# Patient Record
Sex: Female | Born: 1935
Health system: Southern US, Community
[De-identification: ages and names within clinical notes are randomized; demographics above are authoritative.]

## PROBLEM LIST (undated history)

## (undated) DIAGNOSIS — I10 Essential (primary) hypertension: Secondary | ICD-10-CM

## (undated) DIAGNOSIS — I2510 Atherosclerotic heart disease of native coronary artery without angina pectoris: Secondary | ICD-10-CM

## (undated) DIAGNOSIS — I509 Heart failure, unspecified: Secondary | ICD-10-CM

## (undated) DIAGNOSIS — E785 Hyperlipidemia, unspecified: Secondary | ICD-10-CM

## (undated) DIAGNOSIS — Z853 Personal history of malignant neoplasm of breast: Secondary | ICD-10-CM

## (undated) HISTORY — DX: Personal history of malignant neoplasm of breast: Z85.3

## (undated) HISTORY — PX: CARPAL TUNNEL RELEASE: SHX101

## (undated) HISTORY — DX: Essential (primary) hypertension: I10

## (undated) HISTORY — PX: CARDIAC CATHETERIZATION: SHX172

---

## 1998-05-01 ENCOUNTER — Other Ambulatory Visit: Admission: RE | Admit: 1998-05-01 | Discharge: 1998-05-01 | Payer: Self-pay | Admitting: Internal Medicine

## 1999-05-02 ENCOUNTER — Other Ambulatory Visit: Admission: RE | Admit: 1999-05-02 | Discharge: 1999-05-02 | Payer: Self-pay | Admitting: Internal Medicine

## 2000-05-18 ENCOUNTER — Other Ambulatory Visit: Admission: RE | Admit: 2000-05-18 | Discharge: 2000-05-18 | Payer: Self-pay | Admitting: Internal Medicine

## 2000-08-04 ENCOUNTER — Ambulatory Visit (HOSPITAL_COMMUNITY): Admission: RE | Admit: 2000-08-04 | Discharge: 2000-08-04 | Payer: Self-pay | Admitting: Orthopedic Surgery

## 2006-04-08 ENCOUNTER — Encounter (INDEPENDENT_AMBULATORY_CARE_PROVIDER_SITE_OTHER): Payer: Self-pay | Admitting: Specialist

## 2006-04-08 ENCOUNTER — Ambulatory Visit (HOSPITAL_BASED_OUTPATIENT_CLINIC_OR_DEPARTMENT_OTHER): Admission: RE | Admit: 2006-04-08 | Discharge: 2006-04-08 | Payer: Self-pay | Admitting: Orthopedic Surgery

## 2007-08-17 ENCOUNTER — Encounter: Admission: RE | Admit: 2007-08-17 | Discharge: 2007-08-17 | Payer: Self-pay | Admitting: Internal Medicine

## 2007-08-17 ENCOUNTER — Encounter (INDEPENDENT_AMBULATORY_CARE_PROVIDER_SITE_OTHER): Payer: Self-pay | Admitting: Diagnostic Radiology

## 2007-08-23 ENCOUNTER — Ambulatory Visit: Admission: RE | Admit: 2007-08-23 | Discharge: 2007-09-21 | Payer: Self-pay | Admitting: Radiation Oncology

## 2007-08-25 ENCOUNTER — Encounter: Admission: RE | Admit: 2007-08-25 | Discharge: 2007-08-25 | Payer: Self-pay | Admitting: Internal Medicine

## 2007-09-02 ENCOUNTER — Encounter (INDEPENDENT_AMBULATORY_CARE_PROVIDER_SITE_OTHER): Payer: Self-pay | Admitting: Surgery

## 2007-09-02 ENCOUNTER — Encounter: Admission: RE | Admit: 2007-09-02 | Discharge: 2007-09-02 | Payer: Self-pay | Admitting: Surgery

## 2007-09-02 ENCOUNTER — Ambulatory Visit (HOSPITAL_BASED_OUTPATIENT_CLINIC_OR_DEPARTMENT_OTHER): Admission: RE | Admit: 2007-09-02 | Discharge: 2007-09-03 | Payer: Self-pay | Admitting: Surgery

## 2007-09-15 ENCOUNTER — Ambulatory Visit: Payer: Self-pay | Admitting: Oncology

## 2007-09-15 LAB — CBC WITH DIFFERENTIAL/PLATELET
BASO%: 0.1 % (ref 0.0–2.0)
Basophils Absolute: 0 10*3/uL (ref 0.0–0.1)
EOS%: 1 % (ref 0.0–7.0)
HCT: 38.9 % (ref 34.8–46.6)
HGB: 13.4 g/dL (ref 11.6–15.9)
LYMPH%: 16.9 % (ref 14.0–48.0)
MCH: 30.8 pg (ref 26.0–34.0)
MCHC: 34.4 g/dL (ref 32.0–36.0)
MCV: 89.5 fL (ref 81.0–101.0)
NEUT%: 72 % (ref 39.6–76.8)
Platelets: 198 10*3/uL (ref 145–400)

## 2007-09-15 LAB — LACTATE DEHYDROGENASE: LDH: 181 U/L (ref 94–250)

## 2007-09-15 LAB — COMPREHENSIVE METABOLIC PANEL
ALT: 45 U/L — ABNORMAL HIGH (ref 0–35)
AST: 54 U/L — ABNORMAL HIGH (ref 0–37)
BUN: 19 mg/dL (ref 6–23)
Calcium: 9.2 mg/dL (ref 8.4–10.5)
Creatinine, Ser: 0.87 mg/dL (ref 0.40–1.20)
Total Bilirubin: 0.8 mg/dL (ref 0.3–1.2)

## 2007-09-19 LAB — VITAMIN D PNL(25-HYDRXY+1,25-DIHY)-BLD
Vit D, 1,25-Dihydroxy: 50 pg/mL (ref 6–62)
Vit D, 25-Hydroxy: 49 ng/mL (ref 30–89)

## 2007-09-21 ENCOUNTER — Encounter: Payer: Self-pay | Admitting: Oncology

## 2007-09-21 ENCOUNTER — Ambulatory Visit: Admission: RE | Admit: 2007-09-21 | Discharge: 2007-09-21 | Payer: Self-pay | Admitting: Oncology

## 2007-09-24 ENCOUNTER — Ambulatory Visit (HOSPITAL_COMMUNITY): Admission: RE | Admit: 2007-09-24 | Discharge: 2007-09-24 | Payer: Self-pay | Admitting: Oncology

## 2007-09-30 HISTORY — PX: BREAST SURGERY: SHX581

## 2007-10-11 ENCOUNTER — Ambulatory Visit (HOSPITAL_BASED_OUTPATIENT_CLINIC_OR_DEPARTMENT_OTHER): Admission: RE | Admit: 2007-10-11 | Discharge: 2007-10-11 | Payer: Self-pay | Admitting: Surgery

## 2007-10-12 LAB — CBC WITH DIFFERENTIAL/PLATELET
BASO%: 1.3 % (ref 0.0–2.0)
Basophils Absolute: 0.1 10*3/uL (ref 0.0–0.1)
EOS%: 1.4 % (ref 0.0–7.0)
HCT: 39.7 % (ref 34.8–46.6)
HGB: 14 g/dL (ref 11.6–15.9)
LYMPH%: 17.4 % (ref 14.0–48.0)
MCH: 31.1 pg (ref 26.0–34.0)
MCHC: 35.2 g/dL (ref 32.0–36.0)
MCV: 88.4 fL (ref 81.0–101.0)
MONO%: 11.1 % (ref 0.0–13.0)
NEUT%: 68.8 % (ref 39.6–76.8)
Platelets: 177 10*3/uL (ref 145–400)
lymph#: 1.1 10*3/uL (ref 0.9–3.3)

## 2007-10-19 LAB — CBC WITH DIFFERENTIAL/PLATELET
BASO%: 2 % (ref 0.0–2.0)
Basophils Absolute: 0 10*3/uL (ref 0.0–0.1)
EOS%: 6.3 % (ref 0.0–7.0)
HGB: 12.7 g/dL (ref 11.6–15.9)
MCH: 30.5 pg (ref 26.0–34.0)
MCHC: 35.2 g/dL (ref 32.0–36.0)
MCV: 86.6 fL (ref 81.0–101.0)
MONO%: 12 % (ref 0.0–13.0)
RDW: 9.9 % — ABNORMAL LOW (ref 11.3–14.5)
lymph#: 0.5 10*3/uL — ABNORMAL LOW (ref 0.9–3.3)

## 2007-10-25 ENCOUNTER — Ambulatory Visit: Payer: Self-pay | Admitting: Oncology

## 2007-10-26 LAB — CBC WITH DIFFERENTIAL/PLATELET
EOS%: 0.4 % (ref 0.0–7.0)
Eosinophils Absolute: 0 10*3/uL (ref 0.0–0.5)
HGB: 12.9 g/dL (ref 11.6–15.9)
MCH: 30.2 pg (ref 26.0–34.0)
MCV: 86.3 fL (ref 81.0–101.0)
MONO%: 8.8 % (ref 0.0–13.0)
NEUT#: 4.3 10*3/uL (ref 1.5–6.5)
RBC: 4.26 10*6/uL (ref 3.70–5.32)
RDW: 10.4 % — ABNORMAL LOW (ref 11.3–14.5)
lymph#: 0.9 10*3/uL (ref 0.9–3.3)

## 2007-11-02 LAB — CBC WITH DIFFERENTIAL/PLATELET
Basophils Absolute: 0 10*3/uL (ref 0.0–0.1)
Eosinophils Absolute: 0 10*3/uL (ref 0.0–0.5)
HGB: 11.8 g/dL (ref 11.6–15.9)
MCV: 86.1 fL (ref 81.0–101.0)
MONO%: 9.6 % (ref 0.0–13.0)
NEUT#: 0.6 10*3/uL — ABNORMAL LOW (ref 1.5–6.5)
RBC: 3.9 10*6/uL (ref 3.70–5.32)
RDW: 10.6 % — ABNORMAL LOW (ref 11.3–14.5)
WBC: 1.1 10*3/uL — ABNORMAL LOW (ref 3.9–10.0)
lymph#: 0.4 10*3/uL — ABNORMAL LOW (ref 0.9–3.3)

## 2007-11-09 LAB — CBC WITH DIFFERENTIAL/PLATELET
Basophils Absolute: 0.1 10*3/uL (ref 0.0–0.1)
Eosinophils Absolute: 0 10*3/uL (ref 0.0–0.5)
HGB: 12.6 g/dL (ref 11.6–15.9)
LYMPH%: 11.3 % — ABNORMAL LOW (ref 14.0–48.0)
MONO#: 0.8 10*3/uL (ref 0.1–0.9)
NEUT#: 6.9 10*3/uL — ABNORMAL HIGH (ref 1.5–6.5)
Platelets: 161 10*3/uL (ref 145–400)
RBC: 4.11 10*6/uL (ref 3.70–5.32)
RDW: 11.6 % (ref 11.3–14.5)
WBC: 8.8 10*3/uL (ref 3.9–10.0)

## 2007-11-09 LAB — COMPREHENSIVE METABOLIC PANEL
Albumin: 4.2 g/dL (ref 3.5–5.2)
BUN: 12 mg/dL (ref 6–23)
CO2: 23 mEq/L (ref 19–32)
Glucose, Bld: 75 mg/dL (ref 70–99)
Potassium: 3.9 mEq/L (ref 3.5–5.3)
Sodium: 139 mEq/L (ref 135–145)
Total Protein: 6.8 g/dL (ref 6.0–8.3)

## 2007-11-16 LAB — CBC WITH DIFFERENTIAL/PLATELET
Eosinophils Absolute: 0 10*3/uL (ref 0.0–0.5)
MONO#: 0.1 10*3/uL (ref 0.1–0.9)
NEUT#: 1.4 10*3/uL — ABNORMAL LOW (ref 1.5–6.5)
RBC: 3.66 10*6/uL — ABNORMAL LOW (ref 3.70–5.32)
RDW: 13.6 % (ref 11.3–14.5)
WBC: 1.8 10*3/uL — ABNORMAL LOW (ref 3.9–10.0)
lymph#: 0.2 10*3/uL — ABNORMAL LOW (ref 0.9–3.3)

## 2007-11-16 LAB — URINALYSIS, MICROSCOPIC - CHCC
Bilirubin (Urine): NEGATIVE
Blood: NEGATIVE
Glucose: NEGATIVE g/dL
Ketones: NEGATIVE mg/dL
Leukocyte Esterase: NEGATIVE
Specific Gravity, Urine: 1.03 (ref 1.003–1.035)

## 2007-11-23 LAB — URINALYSIS, MICROSCOPIC - CHCC
Ketones: NEGATIVE mg/dL
Nitrite: NEGATIVE
Protein: NEGATIVE mg/dL

## 2007-11-23 LAB — CBC WITH DIFFERENTIAL/PLATELET
BASO%: 1.4 % (ref 0.0–2.0)
EOS%: 0.3 % (ref 0.0–7.0)
HCT: 35.3 % (ref 34.8–46.6)
MCH: 31.2 pg (ref 26.0–34.0)
MCHC: 33.8 g/dL (ref 32.0–36.0)
MCV: 92.1 fL (ref 81.0–101.0)
MONO%: 10.6 % (ref 0.0–13.0)
NEUT%: 77 % — ABNORMAL HIGH (ref 39.6–76.8)
lymph#: 0.7 10*3/uL — ABNORMAL LOW (ref 0.9–3.3)

## 2007-11-25 LAB — URINE CULTURE

## 2007-11-29 LAB — CBC WITH DIFFERENTIAL/PLATELET
Eosinophils Absolute: 0 10*3/uL (ref 0.0–0.5)
MONO#: 0.1 10*3/uL (ref 0.1–0.9)
NEUT#: 3 10*3/uL (ref 1.5–6.5)
Platelets: 174 10*3/uL (ref 145–400)
RBC: 3.41 10*6/uL — ABNORMAL LOW (ref 3.70–5.32)
RDW: 15 % — ABNORMAL HIGH (ref 11.3–14.5)
WBC: 3.6 10*3/uL — ABNORMAL LOW (ref 3.9–10.0)
lymph#: 0.5 10*3/uL — ABNORMAL LOW (ref 0.9–3.3)

## 2007-12-03 ENCOUNTER — Ambulatory Visit: Payer: Self-pay | Admitting: Oncology

## 2007-12-07 LAB — CBC WITH DIFFERENTIAL/PLATELET
Eosinophils Absolute: 0 10*3/uL (ref 0.0–0.5)
HCT: 32.7 % — ABNORMAL LOW (ref 34.8–46.6)
HGB: 11.2 g/dL — ABNORMAL LOW (ref 11.6–15.9)
LYMPH%: 11.3 % — ABNORMAL LOW (ref 14.0–48.0)
MONO#: 0.9 10*3/uL (ref 0.1–0.9)
NEUT#: 5.5 10*3/uL (ref 1.5–6.5)
NEUT%: 75.4 % (ref 39.6–76.8)
Platelets: 152 10*3/uL (ref 145–400)
WBC: 7.3 10*3/uL (ref 3.9–10.0)
lymph#: 0.8 10*3/uL — ABNORMAL LOW (ref 0.9–3.3)

## 2007-12-14 LAB — CBC WITH DIFFERENTIAL/PLATELET
BASO%: 2.4 % — ABNORMAL HIGH (ref 0.0–2.0)
Basophils Absolute: 0.1 10*3/uL (ref 0.0–0.1)
HCT: 28.4 % — ABNORMAL LOW (ref 34.8–46.6)
HGB: 10.2 g/dL — ABNORMAL LOW (ref 11.6–15.9)
LYMPH%: 18 % (ref 14.0–48.0)
MCH: 32.5 pg (ref 26.0–34.0)
MCHC: 36 g/dL (ref 32.0–36.0)
MONO#: 0.2 10*3/uL (ref 0.1–0.9)
NEUT%: 72.6 % (ref 39.6–76.8)
Platelets: 174 10*3/uL (ref 145–400)
WBC: 3.1 10*3/uL — ABNORMAL LOW (ref 3.9–10.0)

## 2008-01-04 LAB — CBC WITH DIFFERENTIAL/PLATELET
BASO%: 1.9 % (ref 0.0–2.0)
EOS%: 2.1 % (ref 0.0–7.0)
HCT: 35.5 % (ref 34.8–46.6)
LYMPH%: 19.4 % (ref 14.0–48.0)
MCH: 31.8 pg (ref 26.0–34.0)
MCHC: 32.8 g/dL (ref 32.0–36.0)
NEUT%: 54.6 % (ref 39.6–76.8)
RBC: 3.66 10*6/uL — ABNORMAL LOW (ref 3.70–5.32)
WBC: 3.9 10*3/uL (ref 3.9–10.0)
lymph#: 0.7 10*3/uL — ABNORMAL LOW (ref 0.9–3.3)

## 2008-01-11 ENCOUNTER — Ambulatory Visit (HOSPITAL_COMMUNITY): Admission: RE | Admit: 2008-01-11 | Discharge: 2008-01-11 | Payer: Self-pay | Admitting: Oncology

## 2008-01-11 LAB — CBC WITH DIFFERENTIAL/PLATELET
BASO%: 1.7 % (ref 0.0–2.0)
EOS%: 4.2 % (ref 0.0–7.0)
HGB: 11.8 g/dL (ref 11.6–15.9)
MCH: 32 pg (ref 26.0–34.0)
MCHC: 33.7 g/dL (ref 32.0–36.0)
MONO#: 0.3 10*3/uL (ref 0.1–0.9)
RDW: 16 % — ABNORMAL HIGH (ref 11.3–14.5)
WBC: 4.1 10*3/uL (ref 3.9–10.0)
lymph#: 0.8 10*3/uL — ABNORMAL LOW (ref 0.9–3.3)

## 2008-01-14 ENCOUNTER — Ambulatory Visit: Payer: Self-pay | Admitting: Oncology

## 2008-01-18 LAB — CBC WITH DIFFERENTIAL/PLATELET
Basophils Absolute: 0.1 10*3/uL (ref 0.0–0.1)
EOS%: 2 % (ref 0.0–7.0)
HGB: 11 g/dL — ABNORMAL LOW (ref 11.6–15.9)
MCH: 32 pg (ref 26.0–34.0)
MCV: 95.7 fL (ref 81.0–101.0)
MONO%: 7.7 % (ref 0.0–13.0)
RBC: 3.43 10*6/uL — ABNORMAL LOW (ref 3.70–5.32)
RDW: 15.6 % — ABNORMAL HIGH (ref 11.3–14.5)

## 2008-01-22 IMAGING — CR DG CHEST 1V PORT
1 series · 1 of 1 positions shown · non-contrast
Comparison: none

CLINICAL DATA: Post port-a-cath insertion for breast cancer.  
 PORTABLE CHEST - 1 VIEW 10/11/07:

[view not recorded]
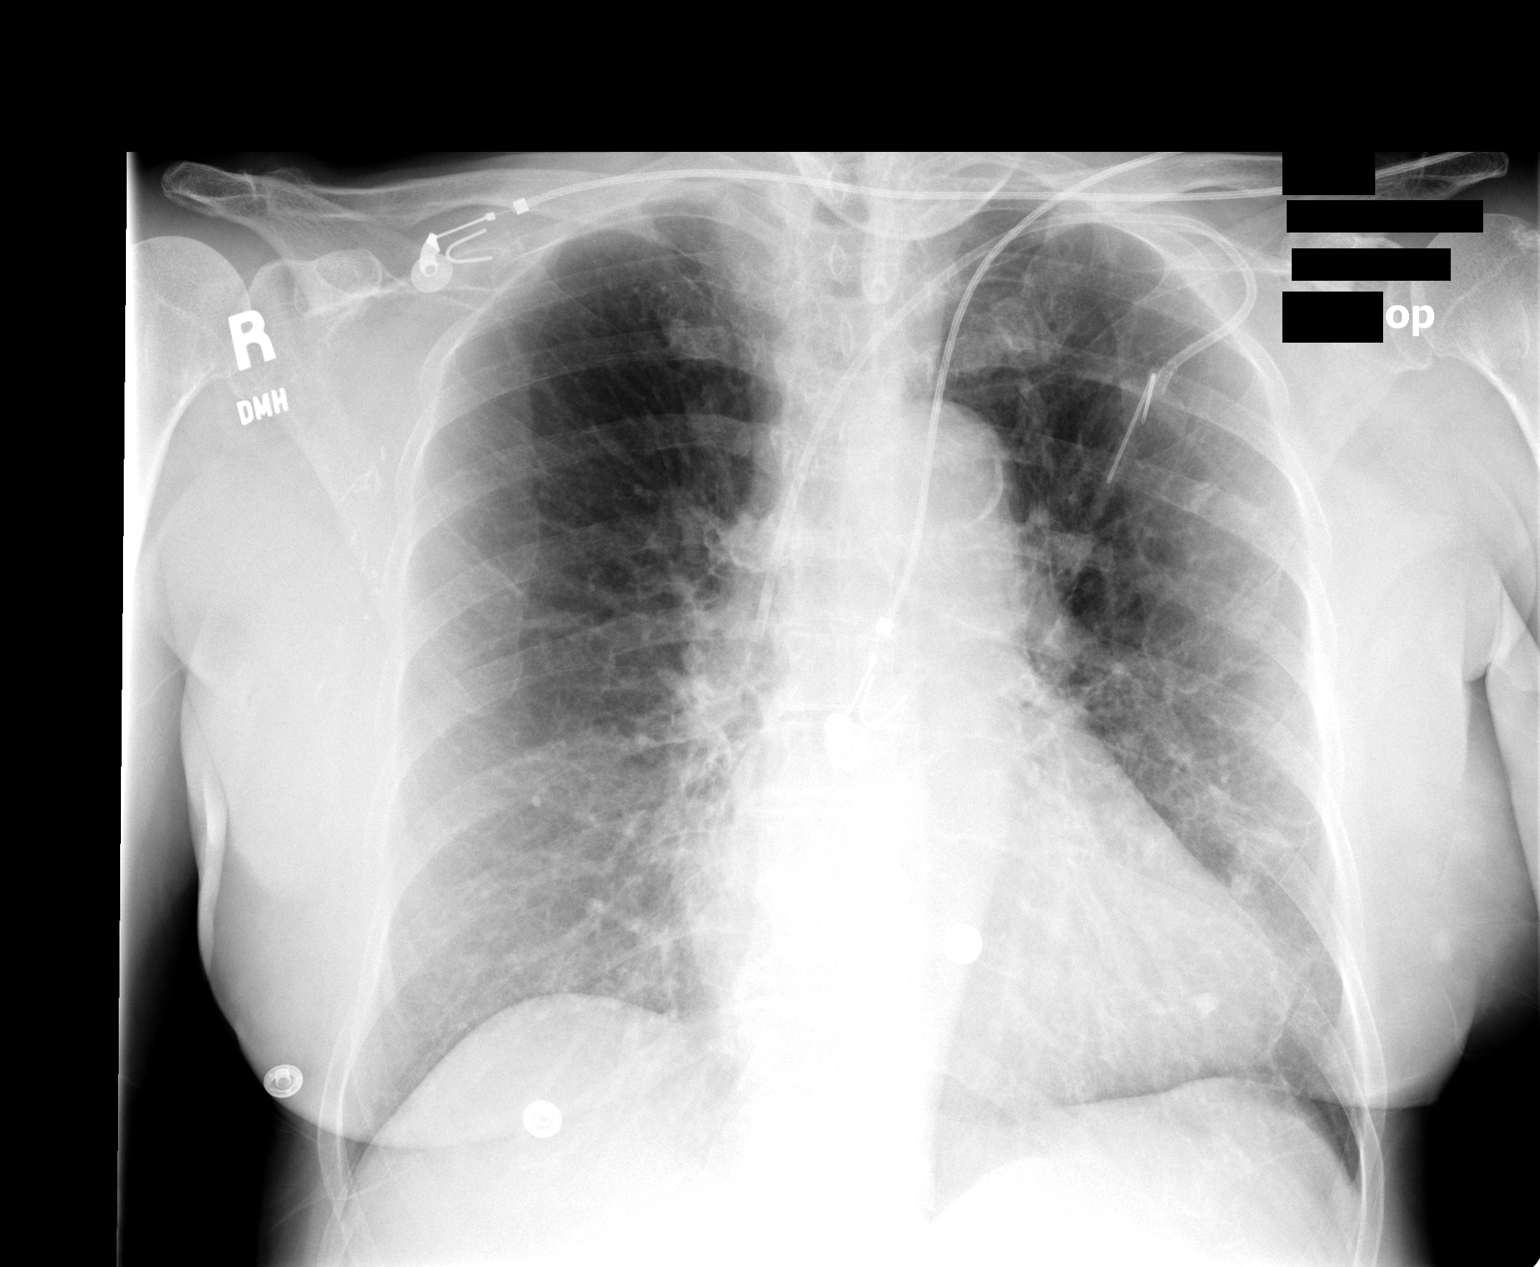

[1 of 1 positions shown; findings below may reference images not displayed]

FINDINGS: Left subclavian port-a-cath terminates in the SVC.  No pneumothorax.  Heart is accentuated by technique.  Lingular scar/atelectasis.
IMPRESSION: No pneumothorax after left subclavian port-a-cath placement.

## 2008-01-25 LAB — CBC WITH DIFFERENTIAL/PLATELET
BASO%: 1.8 % (ref 0.0–2.0)
EOS%: 2.3 % (ref 0.0–7.0)
Eosinophils Absolute: 0.1 10*3/uL (ref 0.0–0.5)
MCV: 95 fL (ref 81.0–101.0)
MONO%: 10.2 % (ref 0.0–13.0)
NEUT#: 2 10*3/uL (ref 1.5–6.5)
RBC: 3.37 10*6/uL — ABNORMAL LOW (ref 3.70–5.32)
RDW: 14.9 % — ABNORMAL HIGH (ref 11.3–14.5)

## 2008-02-01 LAB — CBC WITH DIFFERENTIAL/PLATELET
BASO%: 0.7 % (ref 0.0–2.0)
Eosinophils Absolute: 0.1 10*3/uL (ref 0.0–0.5)
LYMPH%: 23.7 % (ref 14.0–48.0)
MCHC: 35.2 g/dL (ref 32.0–36.0)
MCV: 94 fL (ref 81.0–101.0)
MONO%: 10.9 % (ref 0.0–13.0)
NEUT#: 1.5 10*3/uL (ref 1.5–6.5)
Platelets: 211 10*3/uL (ref 145–400)
RBC: 3.3 10*6/uL — ABNORMAL LOW (ref 3.70–5.32)
RDW: 16.6 % — ABNORMAL HIGH (ref 11.3–14.5)
WBC: 2.4 10*3/uL — ABNORMAL LOW (ref 3.9–10.0)

## 2008-02-08 LAB — COMPREHENSIVE METABOLIC PANEL
ALT: 14 U/L (ref 0–35)
Albumin: 3.7 g/dL (ref 3.5–5.2)
CO2: 20 mEq/L (ref 19–32)
Chloride: 110 mEq/L (ref 96–112)
Glucose, Bld: 210 mg/dL — ABNORMAL HIGH (ref 70–99)
Potassium: 3.8 mEq/L (ref 3.5–5.3)
Sodium: 141 mEq/L (ref 135–145)
Total Bilirubin: 0.5 mg/dL (ref 0.3–1.2)
Total Protein: 6 g/dL (ref 6.0–8.3)

## 2008-02-08 LAB — CBC WITH DIFFERENTIAL/PLATELET
BASO%: 0.3 % (ref 0.0–2.0)
Eosinophils Absolute: 0 10*3/uL (ref 0.0–0.5)
MCHC: 35.9 g/dL (ref 32.0–36.0)
MONO#: 0.3 10*3/uL (ref 0.1–0.9)
NEUT#: 1.9 10*3/uL (ref 1.5–6.5)
Platelets: 204 10*3/uL (ref 145–400)
RBC: 3.04 10*6/uL — ABNORMAL LOW (ref 3.70–5.32)
RDW: 15.7 % — ABNORMAL HIGH (ref 11.3–14.5)
WBC: 2.6 10*3/uL — ABNORMAL LOW (ref 3.9–10.0)
lymph#: 0.4 10*3/uL — ABNORMAL LOW (ref 0.9–3.3)

## 2008-02-08 LAB — MAGNESIUM: Magnesium: 1.9 mg/dL (ref 1.5–2.5)

## 2008-02-22 ENCOUNTER — Ambulatory Visit: Payer: Self-pay | Admitting: Oncology

## 2008-02-25 ENCOUNTER — Ambulatory Visit: Admission: RE | Admit: 2008-02-25 | Discharge: 2008-05-25 | Payer: Self-pay | Admitting: Radiation Oncology

## 2008-02-29 LAB — CBC WITH DIFFERENTIAL/PLATELET
Basophils Absolute: 0.1 10*3/uL (ref 0.0–0.1)
Eosinophils Absolute: 0.1 10*3/uL (ref 0.0–0.5)
HGB: 10.8 g/dL — ABNORMAL LOW (ref 11.6–15.9)
LYMPH%: 24.7 % (ref 14.0–48.0)
MCV: 93.3 fL (ref 81.0–101.0)
MONO%: 9.4 % (ref 0.0–13.0)
NEUT#: 1.7 10*3/uL (ref 1.5–6.5)
Platelets: 204 10*3/uL (ref 145–400)

## 2008-03-02 ENCOUNTER — Ambulatory Visit (HOSPITAL_BASED_OUTPATIENT_CLINIC_OR_DEPARTMENT_OTHER): Admission: RE | Admit: 2008-03-02 | Discharge: 2008-03-02 | Payer: Self-pay | Admitting: Surgery

## 2008-03-27 LAB — COMPREHENSIVE METABOLIC PANEL
ALT: 19 U/L (ref 0–35)
Alkaline Phosphatase: 91 U/L (ref 39–117)
Glucose, Bld: 85 mg/dL (ref 70–99)
Sodium: 140 mEq/L (ref 135–145)
Total Bilirubin: 0.4 mg/dL (ref 0.3–1.2)
Total Protein: 6.8 g/dL (ref 6.0–8.3)

## 2008-03-27 LAB — CBC WITH DIFFERENTIAL/PLATELET
BASO%: 0.3 % (ref 0.0–2.0)
LYMPH%: 13.6 % — ABNORMAL LOW (ref 14.0–48.0)
MCHC: 34 g/dL (ref 32.0–36.0)
MCV: 92.6 fL (ref 81.0–101.0)
MONO%: 10.8 % (ref 0.0–13.0)
Platelets: 195 10*3/uL (ref 145–400)
RBC: 3.85 10*6/uL (ref 3.70–5.32)

## 2008-04-23 IMAGING — RF IR CV CATH INJECTION
6 series · 15 of 15 positions shown · non-contrast
Comparison: None

CLINICAL DATA: No blood return from Port-A-Cath

CV CATH INJECTION/Port-A-Cath injection.

[Series 1: run · 1 of 1 slices shown (1 of 6)]
[im 1/1]
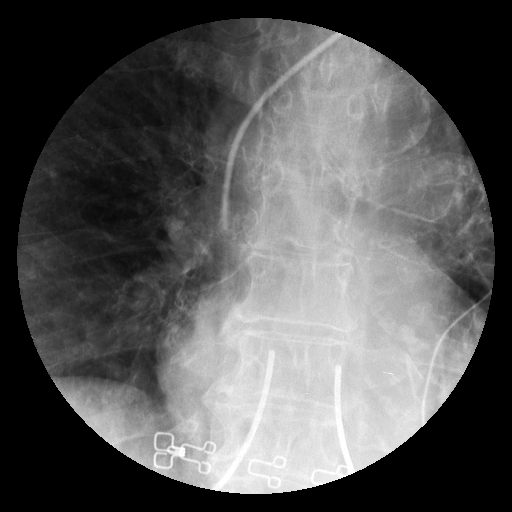

[Series 2: run · 1 of 1 slices shown (2 of 6)]
[im 1/1]
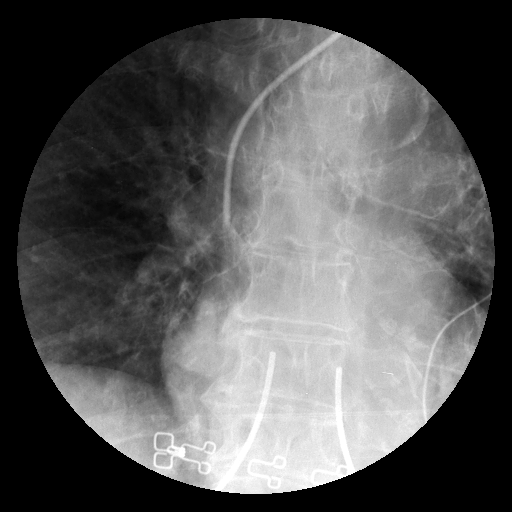

[Series 3: run · 3 of 3 slices shown (3 of 6)]
[im 1/3]
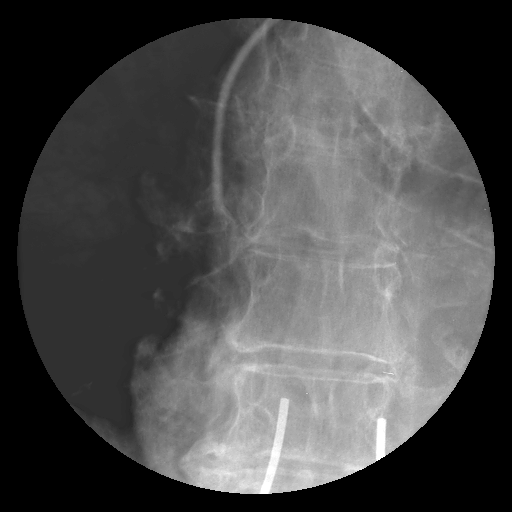
[im 2/3]
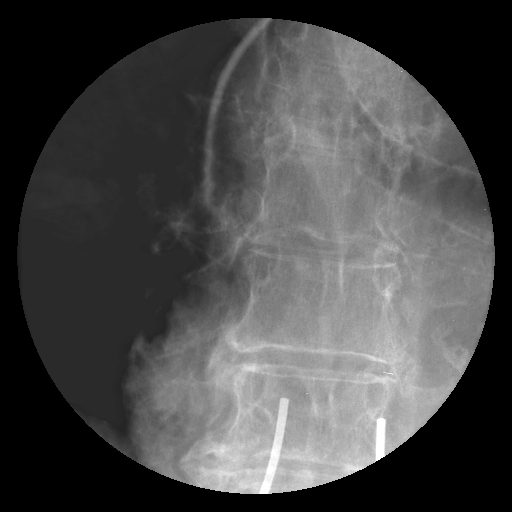
[im 3/3]
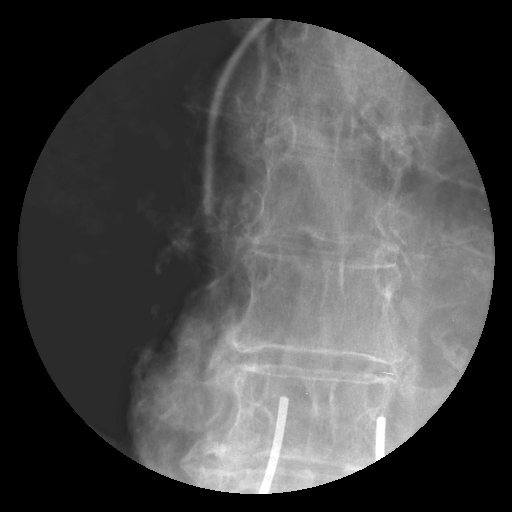

[Series 4: run · 4 of 4 slices shown (4 of 6)]
[im 1/4]
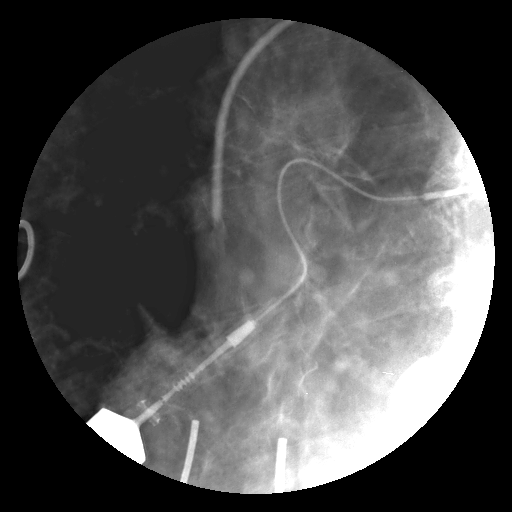
[im 2/4]
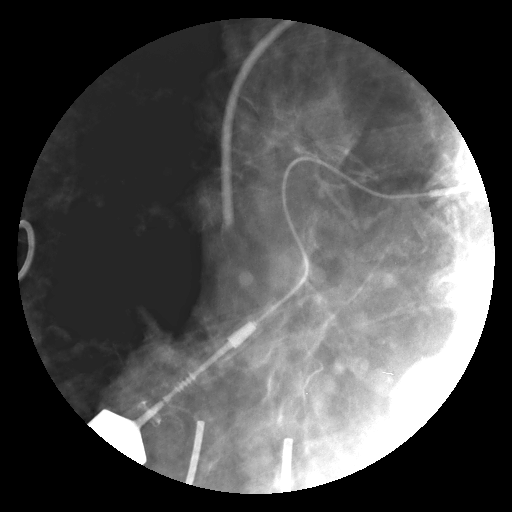
[im 3/4]
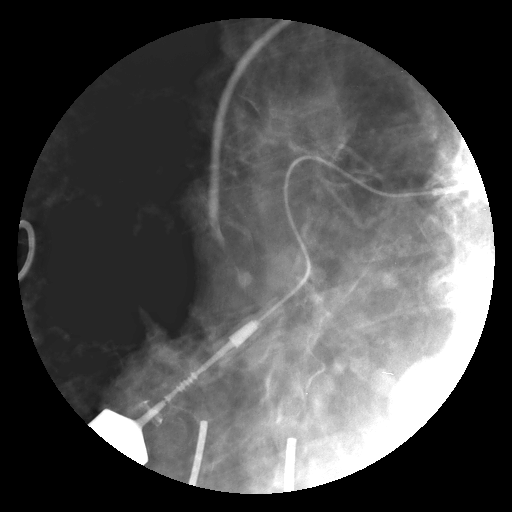
[im 4/4]
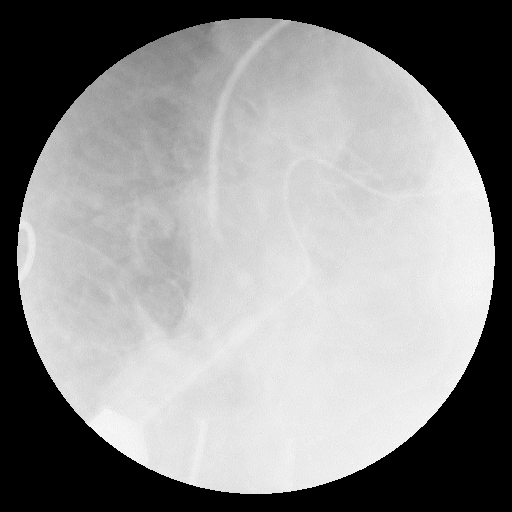

[Series 5: run · 2 of 2 slices shown (5 of 6)]
[im 1/2]
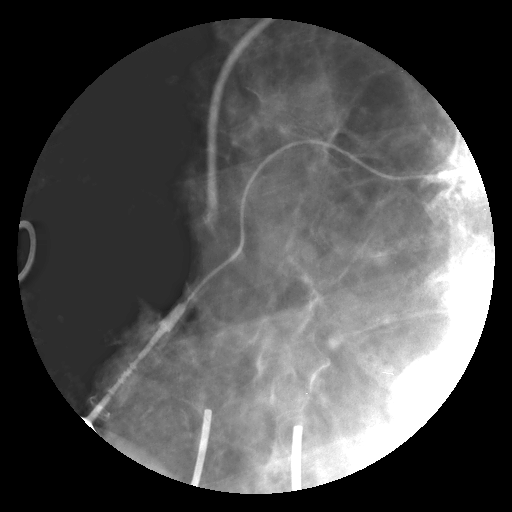
[im 2/2]
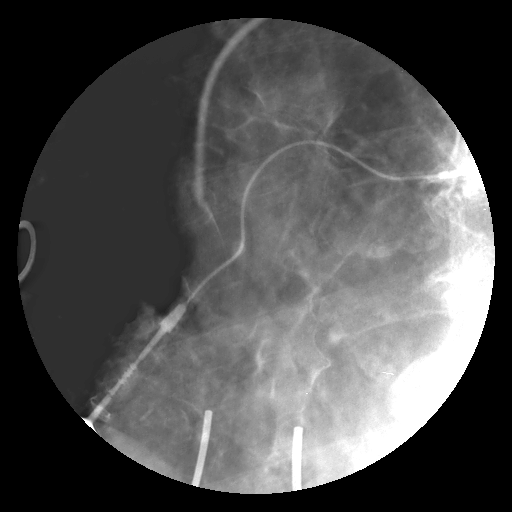

[Series 6: run · 4 of 4 slices shown (6 of 6)]
[im 1/4]
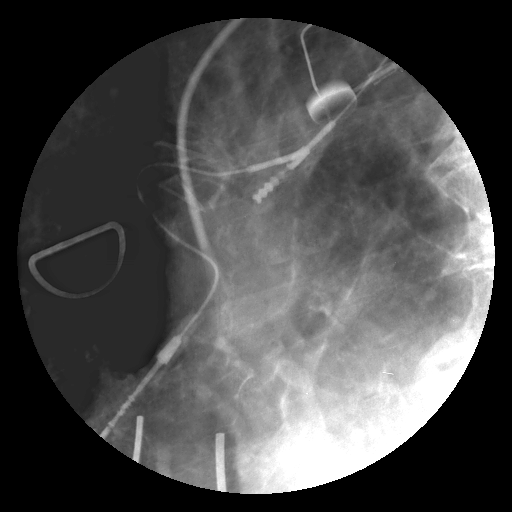
[im 2/4]
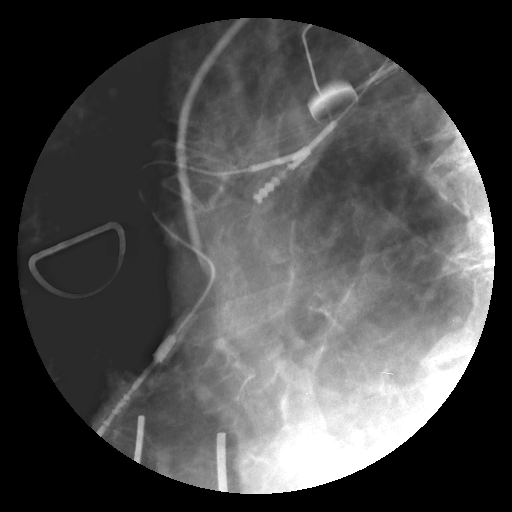
[im 3/4]
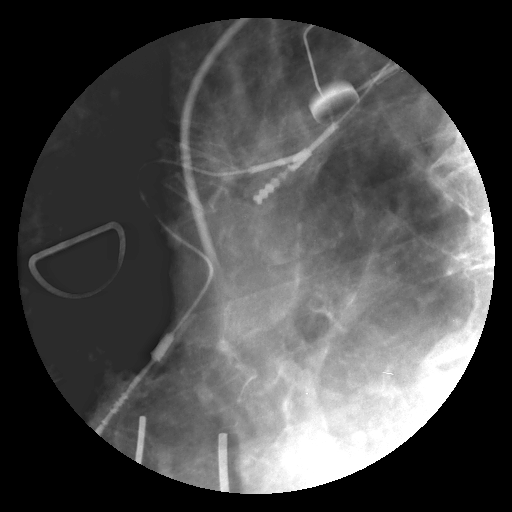
[im 4/4]
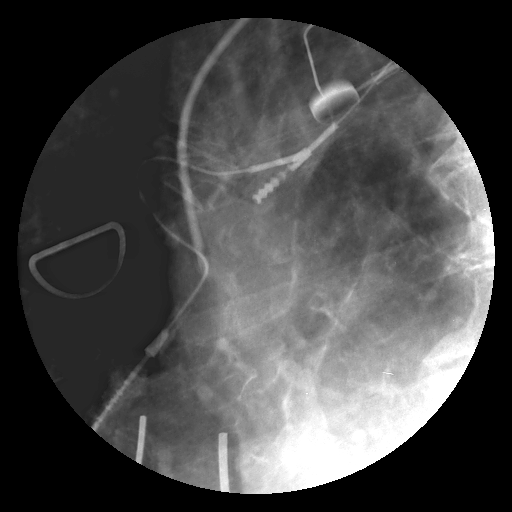

[15 of 15 positions shown; findings below may reference images not displayed]

FINDINGS: The procedure was explained to the patient and informed
consent was obtained. Time-out procedure was performed.  The Port-A-
Cath was accessed by the oncology RN.  I injected approximate 15 ml
4mnipaque-N22 through the Port-A-Cath.  Initially the injection was
somewhat difficult, requiring some degree of pressure to allow exit
of contrast material.  This appeared become less difficult during
the procedure.  There was satisfactory contrast delivery through
the catheter, the tip of which is in the mid to lower SVC.  There
was no definitive evidence for a fibrin sheath.  No unusual
extravasation of contrast. At the end of the procedure, we were
able to obtain some blood return   The port was flushed with 10 ml
of saline and 5 ml of heparin.

Fluoro time:  1.9 minutes.
IMPRESSION: The Port-A-Cath appears to be functioning satisfactorily.  No
evidence of obstruction.  No definite fibrin sheath.

## 2008-06-23 ENCOUNTER — Ambulatory Visit: Payer: Self-pay | Admitting: Oncology

## 2008-06-27 LAB — CBC WITH DIFFERENTIAL/PLATELET
Eosinophils Absolute: 0 10*3/uL (ref 0.0–0.5)
LYMPH%: 19.6 % (ref 14.0–48.0)
MCH: 30.3 pg (ref 26.0–34.0)
MCHC: 34.4 g/dL (ref 32.0–36.0)
MCV: 88 fL (ref 81.0–101.0)
MONO%: 10.4 % (ref 0.0–13.0)
Platelets: 163 10*3/uL (ref 145–400)
RBC: 4.51 10*6/uL (ref 3.70–5.32)

## 2008-06-28 LAB — COMPREHENSIVE METABOLIC PANEL
Alkaline Phosphatase: 83 U/L (ref 39–117)
Glucose, Bld: 109 mg/dL — ABNORMAL HIGH (ref 70–99)
Sodium: 140 mEq/L (ref 135–145)
Total Bilirubin: 0.4 mg/dL (ref 0.3–1.2)
Total Protein: 7.4 g/dL (ref 6.0–8.3)

## 2009-01-09 ENCOUNTER — Ambulatory Visit: Payer: Self-pay | Admitting: Oncology

## 2009-01-11 LAB — CBC WITH DIFFERENTIAL/PLATELET
BASO%: 0 % (ref 0.0–2.0)
EOS%: 0.4 % (ref 0.0–7.0)
HCT: 40 % (ref 34.8–46.6)
MCV: 90.3 fL (ref 79.5–101.0)
MONO#: 0.5 10*3/uL (ref 0.1–0.9)
NEUT#: 3.8 10*3/uL (ref 1.5–6.5)
Platelets: 163 10*3/uL (ref 145–400)
RBC: 4.43 10*6/uL (ref 3.70–5.45)
RDW: 13.3 % (ref 11.2–14.5)
lymph#: 1.2 10*3/uL (ref 0.9–3.3)

## 2009-01-12 LAB — COMPREHENSIVE METABOLIC PANEL
ALT: 18 U/L (ref 0–35)
AST: 23 U/L (ref 0–37)
Albumin: 4.4 g/dL (ref 3.5–5.2)
Calcium: 9.8 mg/dL (ref 8.4–10.5)
Chloride: 104 mEq/L (ref 96–112)
Potassium: 4.1 mEq/L (ref 3.5–5.3)
Total Protein: 7.1 g/dL (ref 6.0–8.3)

## 2009-05-08 ENCOUNTER — Ambulatory Visit: Payer: Self-pay | Admitting: Oncology

## 2009-05-10 LAB — CBC WITH DIFFERENTIAL/PLATELET
Eosinophils Absolute: 0 10*3/uL (ref 0.0–0.5)
HCT: 39 % (ref 34.8–46.6)
LYMPH%: 21.9 % (ref 14.0–49.7)
MCHC: 34.3 g/dL (ref 31.5–36.0)
MCV: 90.8 fL (ref 79.5–101.0)
MONO%: 11.7 % (ref 0.0–14.0)
NEUT#: 3.2 10*3/uL (ref 1.5–6.5)
NEUT%: 65.4 % (ref 38.4–76.8)
Platelets: 168 10*3/uL (ref 145–400)
RBC: 4.29 10*6/uL (ref 3.70–5.45)

## 2009-05-11 LAB — CANCER ANTIGEN 27.29: CA 27.29: 17 U/mL (ref 0–39)

## 2009-05-11 LAB — VITAMIN D 25 HYDROXY (VIT D DEFICIENCY, FRACTURES): Vit D, 25-Hydroxy: 55 ng/mL (ref 30–89)

## 2009-05-11 LAB — COMPREHENSIVE METABOLIC PANEL
ALT: 17 U/L (ref 0–35)
BUN: 17 mg/dL (ref 6–23)
CO2: 26 mEq/L (ref 19–32)
Calcium: 9.3 mg/dL (ref 8.4–10.5)
Creatinine, Ser: 0.83 mg/dL (ref 0.40–1.20)
Glucose, Bld: 95 mg/dL (ref 70–99)
Total Bilirubin: 0.5 mg/dL (ref 0.3–1.2)

## 2009-05-30 DIAGNOSIS — M1990 Unspecified osteoarthritis, unspecified site: Secondary | ICD-10-CM | POA: Insufficient documentation

## 2009-05-30 DIAGNOSIS — E785 Hyperlipidemia, unspecified: Secondary | ICD-10-CM | POA: Insufficient documentation

## 2009-06-29 HISTORY — PX: KNEE ARTHROSCOPY: SUR90

## 2009-10-01 ENCOUNTER — Ambulatory Visit: Payer: Self-pay | Admitting: Oncology

## 2009-10-04 ENCOUNTER — Ambulatory Visit (HOSPITAL_COMMUNITY): Admission: RE | Admit: 2009-10-04 | Discharge: 2009-10-04 | Payer: Self-pay | Admitting: Oncology

## 2009-10-04 LAB — CBC WITH DIFFERENTIAL/PLATELET
Basophils Absolute: 0 10*3/uL (ref 0.0–0.1)
EOS%: 2.6 % (ref 0.0–7.0)
HGB: 14.1 g/dL (ref 11.6–15.9)
MCV: 91.6 fL (ref 79.5–101.0)
MONO#: 0.6 10*3/uL (ref 0.1–0.9)
NEUT%: 57.4 % (ref 38.4–76.8)
RBC: 4.46 10*6/uL (ref 3.70–5.45)

## 2009-10-04 LAB — COMPREHENSIVE METABOLIC PANEL
ALT: 25 U/L (ref 0–35)
Albumin: 4.1 g/dL (ref 3.5–5.2)
Alkaline Phosphatase: 89 U/L (ref 39–117)
Calcium: 9.7 mg/dL (ref 8.4–10.5)
Potassium: 4.3 mEq/L (ref 3.5–5.3)
Sodium: 139 mEq/L (ref 135–145)
Total Protein: 8 g/dL (ref 6.0–8.3)

## 2009-10-04 LAB — VITAMIN D 25 HYDROXY (VIT D DEFICIENCY, FRACTURES): Vit D, 25-Hydroxy: 42 ng/mL (ref 30–89)

## 2010-04-03 ENCOUNTER — Ambulatory Visit: Payer: Self-pay | Admitting: Oncology

## 2010-04-05 LAB — COMPREHENSIVE METABOLIC PANEL
ALT: 19 U/L (ref 0–35)
BUN: 21 mg/dL (ref 6–23)
CO2: 20 mEq/L (ref 19–32)
Creatinine, Ser: 0.88 mg/dL (ref 0.40–1.20)
Glucose, Bld: 100 mg/dL — ABNORMAL HIGH (ref 70–99)
Potassium: 4 mEq/L (ref 3.5–5.3)
Sodium: 139 mEq/L (ref 135–145)
Total Protein: 7.2 g/dL (ref 6.0–8.3)

## 2010-04-05 LAB — CBC WITH DIFFERENTIAL/PLATELET
Basophils Absolute: 0 10*3/uL (ref 0.0–0.1)
EOS%: 1.5 % (ref 0.0–7.0)
MONO#: 0.5 10*3/uL (ref 0.1–0.9)
MONO%: 11.7 % (ref 0.0–14.0)
NEUT#: 2.6 10*3/uL (ref 1.5–6.5)
NEUT%: 59.6 % (ref 38.4–76.8)
RBC: 4.26 10*6/uL (ref 3.70–5.45)
WBC: 4.4 10*3/uL (ref 3.9–10.3)

## 2010-10-08 ENCOUNTER — Ambulatory Visit: Payer: Self-pay | Admitting: Oncology

## 2010-10-10 LAB — CBC WITH DIFFERENTIAL/PLATELET
BASO%: 0.2 % (ref 0.0–2.0)
Basophils Absolute: 0 10*3/uL (ref 0.0–0.1)
EOS%: 1 % (ref 0.0–7.0)
Eosinophils Absolute: 0 10*3/uL (ref 0.0–0.5)
HCT: 40.6 % (ref 34.8–46.6)
HGB: 13.8 g/dL (ref 11.6–15.9)
LYMPH%: 22.1 % (ref 14.0–49.7)
MCH: 31 pg (ref 25.1–34.0)
MCHC: 34.1 g/dL (ref 31.5–36.0)
MCV: 90.9 fL (ref 79.5–101.0)
MONO#: 0.5 10*3/uL (ref 0.1–0.9)
MONO%: 12.5 % (ref 0.0–14.0)
NEUT#: 2.8 10*3/uL (ref 1.5–6.5)
NEUT%: 64.2 % (ref 38.4–76.8)
Platelets: 190 10*3/uL (ref 145–400)
RBC: 4.46 10*6/uL (ref 3.70–5.45)
RDW: 13.2 % (ref 11.2–14.5)
WBC: 4.3 10*3/uL (ref 3.9–10.3)
lymph#: 1 10*3/uL (ref 0.9–3.3)

## 2010-10-10 LAB — COMPREHENSIVE METABOLIC PANEL
ALT: 20 U/L (ref 0–35)
AST: 26 U/L (ref 0–37)
Albumin: 4.8 g/dL (ref 3.5–5.2)
Alkaline Phosphatase: 82 U/L (ref 39–117)
BUN: 20 mg/dL (ref 6–23)
CO2: 25 mEq/L (ref 19–32)
Calcium: 10.1 mg/dL (ref 8.4–10.5)
Chloride: 101 mEq/L (ref 96–112)
Creatinine, Ser: 0.93 mg/dL (ref 0.40–1.20)
Glucose, Bld: 131 mg/dL — ABNORMAL HIGH (ref 70–99)
Potassium: 3.6 mEq/L (ref 3.5–5.3)
Sodium: 140 mEq/L (ref 135–145)
Total Bilirubin: 0.5 mg/dL (ref 0.3–1.2)
Total Protein: 7.9 g/dL (ref 6.0–8.3)

## 2010-10-10 LAB — CANCER ANTIGEN 27.29: CA 27.29: 23 U/mL (ref 0–39)

## 2010-10-10 LAB — VITAMIN D 25 HYDROXY (VIT D DEFICIENCY, FRACTURES): Vit D, 25-Hydroxy: 49 ng/mL (ref 30–89)

## 2010-10-20 ENCOUNTER — Encounter: Payer: Self-pay | Admitting: Oncology

## 2011-02-11 NOTE — Op Note (Signed)
NAME:  Marissa Carroll, Marissa Carroll               ACCOUNT NO.:  [id number]   MEDICAL RECORD NO.:  [id number]          PATIENT TYPE:  AMB   LOCATION:  DSC                          FACILITY:  [facility]   PHYSICIAN:  Currie Paris, M.D.DATE OF BIRTH:  Dec 16, 1927   DATE OF PROCEDURE:  03/02/2008  DATE OF DISCHARGE:                               OPERATIVE REPORT   PREOPERATIVE DIAGNOSIS:  Unneeded Port-A-Cath.   POSTOPERATIVE DIAGNOSIS:  Unneeded Port-A-Cath.   OPERATION:  Removal of Port-A-Cath.   SURGEON:  Currie Paris, M.D.   ANESTHESIA:  Local.   CLINICAL HISTORY:  Ms. Purdy has completed her chemotherapy and  wished to have her port removed.   DESCRIPTION OF PROCEDURE:  The patient was seen in the minor procedure  area and we confirmed Port-A-Cath removal as the planned procedure.   Port site was anesthetized with 1% Xylocaine with epinephrine.  I waited  about 10 minutes and then we reprepped with some Betadine.  The old scar  was opened and the 3 Prolene holding sutures cut.  The port was to  backed out of its tract and a suture placed to keep any back bleeding  from occurring.   The port site was then closed with 3-0 Vicryl and 4-0 Monocryl  subcuticular and some Dermabond.   The patient tolerated the procedure well.  There were no complications.      Currie Paris, M.D.  Electronically Signed     CJS/MEDQ  D:  03/02/2008  T:  03/03/2008  Job:  [id number]

## 2011-02-11 NOTE — Op Note (Signed)
NAME:  Marissa Carroll, Marissa Carroll               ACCOUNT NO.:  [id number]   MEDICAL RECORD NO.:  [id number]          PATIENT TYPE:  AMB   LOCATION:  DSC                          FACILITY:  [facility]   PHYSICIAN:  Currie Paris, M.D.DATE OF BIRTH:  [birth year]   DATE OF PROCEDURE:  09/02/2007  DATE OF DISCHARGE:                               OPERATIVE REPORT   PREOPERATIVE DIAGNOSIS:  Clinical stage I right breast cancer, upper  outer quadrant.   POSTOPERATIVE DIAGNOSIS:  Right breast cancer upper outer quadrant stage  II.   OPERATIVE PROCEDURE:  Needle guided wide local excision of right breast  cancer with blue dye injection, sentinel lymph node biopsy (two nodes)  and full axillary dissection.   SURGEON:  Currie Paris, M.D.   ASSISTANT:  Sheppard Plumber. Earlene Plater, M.D. (for axillary dissection).   CLINICAL HISTORY:  Ms. Laubach is a 79-year lady who recently presented  with a right breast cancer upper outer quadrant that we thought was  amenable to lumpectomy and sentinel node evaluation.  In addition, we  thought she was a candidate possibly for MammoSite postoperative  radiation.   DESCRIPTION OF PROCEDURE:  The patient was seen in the holding area and  we reviewed the procedure in detail with the patient and her family.  We  identified and marked the right breast as the operative side.  The  patient was taken to the operating room and after satisfactory general  anesthesia had been obtained, the time out was done.  I then injected 5  mL of dilute methylene blue subareolarly into the right breast.  This  was massaged in.  The breast was then prepped and draped as a sterile  field.   The guidewire entered laterally and via the mammogram, the tumor  appeared to be 3 cm past the skin entry of the guidewire.  I, therefore,  made a curvilinear incision directly over the anticipated area of the  tumor.  As I was raising a bit of skin flap laterally to manipulate the  wire, I found that the  tumor was really only about 1 cm deep and  directly under the skin lateral to where I had made my incision.  I was  able to get beyond it laterally and then grasped the tissue on either  side of the tumor and the guidewire with Allis clamps to take a wide  excision going down to the chest wall and well beyond the tumor  medially.  This got this tumor out and I thought I was well around in  all directions, except perhaps, superficial.  I sent this to specimen  mammogram and then to the pathologist.   While waiting for that, I used the Neoprobe to find the hot area and  made a short axillary incision.  I divided the subcutaneous tissues and  entered the proper axillary contents and, with some direction from the  NeoProbe and dissection, I found a single node that had counts of about  250 and as I was dissecting this out saw a blue lymphatic leading into  it.  This was excised.  An adjacent second node was also hot and removed  and with this second node taken out, there were no other blue nodes, hot  nodes, or palpably abnormal nodes.  I made sure everything was dry and  put in some Marcaine and closed this incision with 3-0 Vicryl and 4-0  Monocryl subcuticular.   Attention was turned back to the breast and Dr. Debby Bud reported that we  only had 1 mm margin at the superficial margin.  Since the guidewire  tracked in a more medial to lateral direction, I thought the only way to  get a good clean superficial margin was to excise the skin over the  guidewire tract.  I did this in two pieces, one from the lateral part of  the incision and the other from the medial, using a diamond shaped  incision.  I thought I was then completely around the area of the tumor.  I had also taken a little bit of fatty tissue at the very base where I  had a small amount of fatty tissue left on the muscle to achieve a deep  margin at the fascia.   I put some Marcaine in and irrigated.  I was able to reclose some  of the  breast tissue deep with some 2-0 and 3-0 Vicryl.  The skin now had a  cross shaped incision and I tried to see if I could do this in any  fashion other than closing it as a cross by stretching it out in  different directions, but it was clear that the least deformity was  going to be caused if I closed this as two incisions at right angles to  each other.  I closed some of subcu with 3-0 Vicryl and the skin with  some 4-0 nylon.   While I was doing this, the pathologist reported that the first sentinel  lymph node was positive.  I, therefore, embarked on the axillary  dissection.  The axillary incision was enlarged and the sutures removed.  I was able to divide the fatty tissue at the inferior margin of my  incision down to the chest wall and identify latissimus posteriorly and  pectoralis anteriorly.  I then opened the clavipectoral fascia and  stripped the axillary contents out from medial to lateral superior to  inferior.  I identified the axillary vein and stayed below that.  I  identified the second intercostal nerve and was able to dissect the  tissue off of that and preserve that.  The long thoracic and  thoracodorsal nerves were identified and preserved and the intervening  tissue removed.  Final lateral attachments of the axillary contents at  the latissimus were removed with cautery.  I used cautery and clips as  needed on blood vessels.   I spent several minutes irrigating and making sure everything was dry.  I checked that the nerves still functioned and they appeared to work  okay.  I placed a 19 Blake drain in and secured it with a 2-0 nylon.  Final irrigation was done and the incision closed with 3-0 Vicryl, 4-0  Monocryl subcuticular, and Dermabond on the skin.  The patient tolerated  the procedure well and there were no complications.  All counts were  correct.      Currie Paris, M.D.  Electronically Signed     CJS/MEDQ  D:  09/02/2007  T:   09/02/2007  Job:  [id number]   cc:   Geoffry Paradise, M.D.  Artist Pais  Tammi Klippel, M.D.

## 2011-02-11 NOTE — Op Note (Signed)
NAME:  Marissa Carroll, Marissa Carroll               ACCOUNT NO.:  [id number]   MEDICAL RECORD NO.:  [id number]          PATIENT TYPE:  AMB   LOCATION:  DSC                          FACILITY:  [facility]   PHYSICIAN:  Currie Paris, M.D.DATE OF BIRTH:  April 07, 1928   DATE OF PROCEDURE:  10/11/2007  DATE OF DISCHARGE:                               OPERATIVE REPORT   OFFICE MEDICAL RECORD NUMBER:  [id number].   PREOPERATIVE DIAGNOSIS:  Carcinoma right breast.   POSTOPERATIVE DIAGNOSIS:  Carcinoma right breast.   OPERATION:  Placement of Port-A-Cath.   SURGEON:  Currie Paris, M.D.   ANESTHESIA:  MAC.   CLINICAL HISTORY:  Ms. Mullinix is a 77 year old lady getting ready to  have chemotherapy for her right breast cancer.  IV access was needed.   DESCRIPTION OF PROCEDURE:  The patient was seen in the holding area, and  we reviewed the plans for the procedure.  She had no further questions.   The patient was taken to the operating room, and after satisfactory IV  sedation the upper chest and lower neck were prepped and draped as a  sterile field.  The time-out was performed.   One percent Xylocaine was infiltrated into the left infraclavicular  fossa, and the left subclavian vein entered on the initial attempt.  The  guidewire was threaded easily, and under fluoro was checked the and  positioned in the superior vena cava, right atrial area.   Additional local was infiltrated, and a transverse incision made in the  subcutaneous pocket, fashioned with the cautery and blunt dissection.  A  tunnel was made from here to the guidewire entry site, and the Port-A-  Cath tubing pulled through the tunnel.   The dilator and peel-away sheath were placed easily over the guidewire,  and the guidewire and dilator were removed.  The catheter was threaded  to approximately 23 cm.  It aspirated and irrigated easily, and the peel-  away sheath was removed.   Using fluoro, I could see we were in the right  atrium, and I backed the  catheter up until we were at about 18.5 cm from the skin, at which point  I appeared to be the distal superior vena cava but above the atrium.  The catheter aspirated and flushed easily.  The reservoir was flushed,  attached, and it also flushed and aspirated easily.   The reservoir was secured to the deep tissues with 2-0 Prolenes and  appeared to lie nicely in its pocket.  Final check was made by fluoro,  and it appeared to be good position, with no kinks.   The incision was closed with 3-0 Vicryl, 4-0 Monocryl subcuticular, and  some Steri-Strips.   The catheter was then accessed and aspirated, then flushed easily.  It  was flushed first with dilute and then concentrated aqueous heparin.  Sterile dressings were applied.  The patient tolerated the procedure  well.  There were no operative complications noted.  All counts were  correct.      Currie Paris, M.D.  Electronically Signed     CJS/MEDQ  D:  10/11/2007  T:  10/12/2007  Job:  [id number]

## 2011-02-13 ENCOUNTER — Encounter (INDEPENDENT_AMBULATORY_CARE_PROVIDER_SITE_OTHER): Payer: Self-pay | Admitting: Surgery

## 2011-04-25 ENCOUNTER — Encounter (INDEPENDENT_AMBULATORY_CARE_PROVIDER_SITE_OTHER): Payer: Self-pay | Admitting: Surgery

## 2011-04-25 ENCOUNTER — Ambulatory Visit (INDEPENDENT_AMBULATORY_CARE_PROVIDER_SITE_OTHER): Payer: Medicare Other | Admitting: Surgery

## 2011-04-25 VITALS — BP 128/82 | HR 72 | Temp 97.1°F | Ht 64.0 in

## 2011-04-25 DIAGNOSIS — Z853 Personal history of malignant neoplasm of breast: Secondary | ICD-10-CM

## 2011-04-25 NOTE — Progress Notes (Signed)
04/25/2011  Marissa Carroll is a 77 y.o.female who presents for routine followup of her Stage II, IDC, Right breast, UOQ, receptor +diagnosed in 2008 and treated with Lumpectomy,SLN, radiation, chemo. She has no problems or concerns on either side.  PFSH She has had no significant changes since the last visit here.  ROS There have been no significant changes since the last visit here  General: The patient is alert, oriented, generally healty appearing, NAD. Mood and affect are normal.  Breasts: The right breast remains a little bit tender especially at the lumpectomy site. However there is no evidence of local recurrence or new lumps. The left breast is completely normal. Lymphatics: She has no axillary or supraclavicular adenopathy on either side.  Extremities: Full ROM of the surgical side with no lymphedema noted.  Data Reviewed: I reviewed her last mammogram report which was normal done in January of 2012. Also reviewed Dr. Darrall Dears notes  Impression: Doing well with no evidence of recurrence or new problems  Plan: Will see again next year

## 2011-07-07 LAB — COMPREHENSIVE METABOLIC PANEL
ALT: 24
AST: 29
CO2: 26
Calcium: 9.7
Chloride: 104
Creatinine, Ser: 0.84
GFR calc Af Amer: >60
GFR calc non Af Amer: >60
Glucose, Bld: 152 — ABNORMAL HIGH
Sodium: 136
Total Bilirubin: 0.9

## 2011-07-07 LAB — URINE MICROSCOPIC-ADD ON

## 2011-07-07 LAB — URINALYSIS, ROUTINE W REFLEX MICROSCOPIC
Ketones, ur: NEGATIVE
Nitrite: NEGATIVE
Protein, ur: NEGATIVE
Urobilinogen, UA: 0.2

## 2011-07-07 LAB — CBC
Hemoglobin: 14.6
MCHC: 33.9
MCV: 91.1
RBC: 4.74
WBC: 5.3

## 2011-07-07 LAB — DIFFERENTIAL
Basophils Absolute: 0
Eosinophils Absolute: 0.1 — ABNORMAL LOW
Eosinophils Relative: 2
Neutrophils Relative %: 63

## 2011-08-27 ENCOUNTER — Telehealth: Payer: Self-pay | Admitting: Oncology

## 2011-08-27 NOTE — Telephone Encounter (Signed)
pt called and r/s appt on 10/07/11 to 10/06/2011

## 2011-10-01 DIAGNOSIS — Z853 Personal history of malignant neoplasm of breast: Secondary | ICD-10-CM | POA: Diagnosis not present

## 2011-10-01 DIAGNOSIS — Z09 Encounter for follow-up examination after completed treatment for conditions other than malignant neoplasm: Secondary | ICD-10-CM | POA: Diagnosis not present

## 2011-10-06 ENCOUNTER — Other Ambulatory Visit: Payer: Medicare Other | Admitting: Lab

## 2011-10-06 ENCOUNTER — Telehealth: Payer: Self-pay | Admitting: Oncology

## 2011-10-06 ENCOUNTER — Ambulatory Visit (HOSPITAL_BASED_OUTPATIENT_CLINIC_OR_DEPARTMENT_OTHER): Payer: Medicare Other | Admitting: Oncology

## 2011-10-06 VITALS — BP 159/73 | HR 67 | Temp 97.9°F | Ht 64.0 in | Wt 145.3 lb

## 2011-10-06 DIAGNOSIS — C50919 Malignant neoplasm of unspecified site of unspecified female breast: Secondary | ICD-10-CM | POA: Diagnosis not present

## 2011-10-06 DIAGNOSIS — Z79811 Long term (current) use of aromatase inhibitors: Secondary | ICD-10-CM | POA: Diagnosis not present

## 2011-10-06 DIAGNOSIS — Z170 Estrogen receptor positive status [ER+]: Secondary | ICD-10-CM | POA: Diagnosis not present

## 2011-10-06 NOTE — Telephone Encounter (Signed)
gv pt appt schedule for aug °

## 2011-10-06 NOTE — Progress Notes (Signed)
ID: Marissa Carroll  DOB: 1928-08-21  MR#: [id number]  CSN#: [id number]   Interval History:   Marissa Carroll returns today for followup of her breast cancer. Interval history is unremarkable. Her daughter Jerene Dilling, lost her husband suddenly as we know, is doing well as are her 2 children, the son was 13 and the daughter who is 32. Camrynn's son and his wife are doing well of course to have a special needs child. All this keeps Marissa Carroll dizzy. She is also very active in her church, does a lot of driving of for people need to get to the doctor I, and is still playing tennis as often as she can get to it.  ROS:  A detailed review of systems today was entirely negative. In particular she is having no side effects that she is aware of from the anastrozole. There have been no unusual headaches visual changes cough phlegm production pleurisy shortness of breath change in bowel or bladder habits, or any bleeding rash fever or skin change that she is aware of  No Known Allergies  Current Outpatient Prescriptions  Medication Sig Dispense Refill  . anastrozole (ARIMIDEX) 1 MG tablet Take 1 mg by mouth daily.        Marland Kitchen aspirin 81 MG tablet Take 81 mg by mouth daily.        . Calcium-Vitamin D (CALTRATE 600 PLUS-VIT D PO) Take 2 tablets by mouth daily.        Marland Kitchen triamterene-hydrochlorothiazide (MAXZIDE-25) 37.5-25 MG per tablet       . Multiple Vitamin (MULTIVITAMIN) tablet Take 1 tablet by mouth daily.           Objective:  Filed Vitals:   10/06/11 1335  BP: 159/73  Pulse: 67  Temp: 97.9 F (36.6 C)    BMI: Body mass index is 24.94 kg/(m^2).   ECOG FS:  Physical Exam:   Sclerae unicteric  Oropharynx clear  No peripheral adenopathy  Lungs clear -- no rales or rhonchi  Heart regular rate and rhythm  Abdomen benign  MSK no focal spinal tenderness, no peripheral edema  Neuro nonfocal  Breast exam: Right breast is status post lumpectomy, no evidence of local recurrence. Left breast no suspicious findings  Lab  Results: No labs were obtained today as she recently had lab work through Dr. Daine Gravel office. We will try to get those for our records.      Chemistry      Component Value Date/Time   NA 140 10/10/2010 1401   K 3.6 10/10/2010 1401   CL 101 10/10/2010 1401   CO2 25 10/10/2010 1401   BUN 20 10/10/2010 1401   CREATININE 0.93 10/10/2010 1401      Component Value Date/Time   CALCIUM 10.1 10/10/2010 1401   ALKPHOS 82 10/10/2010 1401   AST 26 10/10/2010 1401   ALT 20 10/10/2010 1401   BILITOT 0.5 10/10/2010 1401       Lab Results  Component Value Date   WBC 4.3 10/10/2010   HGB 13.8 10/10/2010   HCT 40.6 10/10/2010   MCV 90.9 10/10/2010   PLT 190 10/10/2010   NEUTROABS 2.8 10/10/2010    Studies/Results:  She had mammography and a bone density at [facility] the mammogram was fine the bone density, January 2, showed improved bone density both in the lumbar spine and in the right hip.  Assessment: 78 year old [place] woman status post right lumpectomy and axillary lymph node dissection December of 2008 for a T2 N2, Stage III invasive  ductal carcinoma, grade 2, estrogen receptor positive at 99%, progesterone receptor positive at 90%, with an MIB-1 of 19%, and HER-2 nonamplified; status post doxorubicin/cyclophosphamide x4 in dose dense fashion followed by paclitaxel weekly x12 followed by radiation completed in August of 2009 at which point she started anastrozole.   Plan: She is doing terrific and the bone density results are very encouraging. She is going to continue on anastrozole on total August of 2014. I will see her again August of this year and then August of next year at which time she will be released from followup. I'm not going to do lab work the next visit. She knows to call for any problems that may develop before that  MAGRINAT,GUSTAV C 10/06/2011

## 2012-02-22 ENCOUNTER — Other Ambulatory Visit: Payer: Self-pay | Admitting: Oncology

## 2012-02-22 DIAGNOSIS — C50919 Malignant neoplasm of unspecified site of unspecified female breast: Secondary | ICD-10-CM

## 2012-03-01 ENCOUNTER — Telehealth: Payer: Self-pay | Admitting: Oncology

## 2012-03-01 ENCOUNTER — Encounter (INDEPENDENT_AMBULATORY_CARE_PROVIDER_SITE_OTHER): Payer: Self-pay | Admitting: Surgery

## 2012-03-01 NOTE — Telephone Encounter (Signed)
S/w the pt regarding her r/s aug appts

## 2012-03-03 ENCOUNTER — Telehealth (INDEPENDENT_AMBULATORY_CARE_PROVIDER_SITE_OTHER): Payer: Self-pay | Admitting: General Surgery

## 2012-03-03 NOTE — Telephone Encounter (Signed)
Patient aware she is okay to follow up with Dr Darnelle Catalan since she is almost five years from surgery. She will call us if she needs [place].

## 2012-03-03 NOTE — Telephone Encounter (Signed)
Message copied by Liliana Cline on Wed Mar 03, 2012  5:50 PM ------      Message from: Rise Paganini      Created: Wed Mar 03, 2012  4:30 PM      Regarding: Dr. Jamey Ripa      Contact: [phone]       Patient is on the recall list to see Dr. Jamey Ripa, but has been seeing Dr. Darnelle Catalan. She thought that Dr. Jamey Ripa told her last year that she doesn't have to see them both. Please call patient to discuss and let me know if she should be scheduled to come into the office or not. Thank you.

## 2012-04-14 DIAGNOSIS — Z961 Presence of intraocular lens: Secondary | ICD-10-CM | POA: Diagnosis not present

## 2012-04-14 DIAGNOSIS — H43819 Vitreous degeneration, unspecified eye: Secondary | ICD-10-CM | POA: Diagnosis not present

## 2012-04-14 DIAGNOSIS — H16109 Unspecified superficial keratitis, unspecified eye: Secondary | ICD-10-CM | POA: Diagnosis not present

## 2012-04-14 DIAGNOSIS — H04129 Dry eye syndrome of unspecified lacrimal gland: Secondary | ICD-10-CM | POA: Diagnosis not present

## 2012-05-13 ENCOUNTER — Ambulatory Visit: Payer: Medicare Other | Admitting: Oncology

## 2012-05-18 ENCOUNTER — Ambulatory Visit (HOSPITAL_BASED_OUTPATIENT_CLINIC_OR_DEPARTMENT_OTHER): Payer: Medicare Other | Admitting: Oncology

## 2012-05-18 ENCOUNTER — Ambulatory Visit (HOSPITAL_BASED_OUTPATIENT_CLINIC_OR_DEPARTMENT_OTHER): Payer: Medicare Other | Admitting: Lab

## 2012-05-18 ENCOUNTER — Telehealth: Payer: Self-pay | Admitting: *Deleted

## 2012-05-18 VITALS — BP 148/75 | HR 83 | Temp 98.2°F | Resp 20 | Ht 64.0 in | Wt 145.8 lb

## 2012-05-18 DIAGNOSIS — C50919 Malignant neoplasm of unspecified site of unspecified female breast: Secondary | ICD-10-CM | POA: Diagnosis not present

## 2012-05-18 DIAGNOSIS — Z170 Estrogen receptor positive status [ER+]: Secondary | ICD-10-CM | POA: Diagnosis not present

## 2012-05-18 DIAGNOSIS — C50419 Malignant neoplasm of upper-outer quadrant of unspecified female breast: Secondary | ICD-10-CM

## 2012-05-18 DIAGNOSIS — C773 Secondary and unspecified malignant neoplasm of axilla and upper limb lymph nodes: Secondary | ICD-10-CM | POA: Diagnosis not present

## 2012-05-18 LAB — COMPREHENSIVE METABOLIC PANEL
ALT: 20 U/L (ref 0–35)
Alkaline Phosphatase: 96 U/L (ref 39–117)
CO2: 29 mEq/L (ref 19–32)
Creatinine, Ser: 1.32 mg/dL — ABNORMAL HIGH (ref 0.50–1.10)
Total Bilirubin: 0.4 mg/dL (ref 0.3–1.2)

## 2012-05-18 LAB — CBC WITH DIFFERENTIAL/PLATELET
BASO%: 0.3 % (ref 0.0–2.0)
HCT: 38.3 % (ref 34.8–46.6)
LYMPH%: 24.5 % (ref 14.0–49.7)
MCH: 30 pg (ref 25.1–34.0)
MCHC: 34.7 g/dL (ref 31.5–36.0)
MCV: 86.3 fL (ref 79.5–101.0)
MONO#: 0.6 10*3/uL (ref 0.1–0.9)
MONO%: 10.1 % (ref 0.0–14.0)
NEUT%: 63.4 % (ref 38.4–76.8)
Platelets: 180 10*3/uL (ref 145–400)
WBC: 5.8 10*3/uL (ref 3.9–10.3)

## 2012-05-18 LAB — CANCER ANTIGEN 27.29: CA 27.29: 22 U/mL (ref 0–39)

## 2012-05-18 MED ORDER — ANASTROZOLE 1 MG PO TABS: 1.0000 mg | ORAL_TABLET | Freq: Every day | ORAL | Status: DC

## 2012-05-18 NOTE — Telephone Encounter (Signed)
Sent patient back to the lab on 05-18-2012 gave patient appointment for [phone]

## 2012-05-18 NOTE — Progress Notes (Signed)
ID: Marissa Carroll   DOB: 07/27/1928  MR#: [id number]  JXB#:[id number]  HISTORY OF PRESENT ILLNESS:  [name] gets yearly mammograms and she had screening mammography October 28 at [facility] showing a question of architectural distortion in the upper outer quadrant of the right breast.  Accordingly, on November 11 she came back for addition views, which showed a new area of abnormality demonstrable by ultrasound and measuring up to 16.2 mm.  With this information the [facility], which was performed November 18 and showed ([id number] and [phone]) an invasive ductal carcinoma, which was 99% ER positive, 9% PR positive, 19% on the proliferation marker and negative for HercepTest at 1+.  With this information the patient was referred to Dr. Jamey Ripa and bilateral MRIs were obtained November 26.  This showed a solitary mass in the upper outer quadrant of the right breast measuring up to 2.1 cm.  There was no enlarged axillary or internal mammary adenopathy.  Accordingly, on December 4 the patient proceeded to needle-guided lumpectomy with sentinel lymph node biopsy and since sentinel lymph node was positive she had a full axillary dissection.  The final pathology ([id number]) showed a 2.1 cm grade 2 infiltrating ductal carcinoma with ample margins, evidence of lymphovascular invasion and more importantly 6 out of 14 lymph nodes involved some with extracapsular extension (there were no micrometastatic deposits). Her subsequent history is as detailed below.  INTERVAL HISTORY: Marissa Carroll returns today for routine followup of her breast cancer. The interval history is generally unremarkable. She and her husband Marissa Carroll are planning a trip to [place] for their 60th wedding anniversary.  REVIEW OF SYSTEMS: She is tolerating the anastrozole with no symptoms that she is aware of, and specifically no problems with hot flashes or vaginal dryness. She continues to play tennis  several times a week. A detailed review of systems today was entirely negative.  PAST MEDICAL HISTORY: Past Medical History  Diagnosis Date  . Hypertension   . History of breast cancer   Significant for right knee arthroscopy times two under Jim Apling[facility], status post remote appendectomy, status post carpal tunnel release on the right, status post tendon release on the left, history of tonsillectomy, history of bilateral cataract surgery  PAST SURGICAL HISTORY: Past Surgical History  Procedure Date  . Breast surgery 2009    lumpectomy  . Knee arthroscopy 10/10    right    FAMILY HISTORY Family History  Problem Relation Age of Onset  . Diabetes Sister     two sisters  The patient's father died at the age of 68 from a myocardial infarction.  The patient's mother died at 23 from cancer of the throat.  The patient has eight brothers and sisters, one brother died with stomach cancer.  GYNECOLOGIC HISTORY: She is Gx, P2, first pregnancy age 58.  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  She and her family are very involved in the [employer] and they attend the [employer].  The patient's husband Marissa Carroll used to own some automobile dealerships.  Their son Marissa Carroll runs a horse farm. iThe patient's daughter 's name is Marissa Carroll.   ADVANCED DIRECTIVES: in place  HEALTH MAINTENANCE: History  Substance Use Topics  . Smoking status: Never Smoker   . Smokeless tobacco: Not on file  . Alcohol Use: 4.2 oz/week  7 Glasses of wine per week     Colonoscopy:  PAP:  Bone density:  Lipid panel:  No Known Allergies  Current Outpatient Prescriptions  Medication Sig Dispense Refill  . anastrozole (ARIMIDEX) 1 MG tablet Take 1 tablet (1 mg total) by mouth daily.  90 tablet  3  . aspirin 81 MG tablet Take 81 mg by mouth daily.          . Calcium-Vitamin D (CALTRATE 600 PLUS-VIT D PO) Take 2 tablets by mouth daily.        Marland Kitchen triamterene-hydrochlorothiazide (MAXZIDE-25) 37.5-25 MG per tablet         OBJECTIVE: middle-aged white woman who appears fit Filed Vitals:   05/18/12 1505  BP: 148/75  Pulse: 83  Temp: 98.2 F (36.8 C)  Resp: 20     Body mass index is 25.03 kg/(m^2).    ECOG FS: 0  Sclerae unicteric Oropharynx clear No cervical or supraclavicular adenopathy Lungs no rales or rhonchi Heart regular rate and rhythm Abd benign MSK no focal spinal tenderness, no peripheral edema Neuro: nonfocal Breasts: The right breast is status post lumpectomy and radiation. There is no evidence of local recurrence. The left breast is unremarkable.  LAB RESULTS: Lab Results  Component Value Date   WBC 5.8 05/18/2012   NEUTROABS 3.7 05/18/2012   HGB 13.3 05/18/2012   HCT 38.3 05/18/2012   MCV 86.3 05/18/2012   PLT 180 05/18/2012      Chemistry      Component Value Date/Time   NA 140 10/10/2010 1401   K 3.6 10/10/2010 1401   CL 101 10/10/2010 1401   CO2 25 10/10/2010 1401   BUN 20 10/10/2010 1401   CREATININE 0.93 10/10/2010 1401      Component Value Date/Time   CALCIUM 10.1 10/10/2010 1401   ALKPHOS 82 10/10/2010 1401   AST 26 10/10/2010 1401   ALT 20 10/10/2010 1401   BILITOT 0.5 10/10/2010 1401       Lab Results  Component Value Date   LABCA2 23 10/10/2010    No components found with this basename: [id number]    No results found for this basename: INR:1;PROTIME:1 in the last 168 hours  Urinalysis    Component Value Date/Time   COLORURINE YELLOW 09/01/2007 1500   APPEARANCEUR CLEAR 09/01/2007 1500   LABSPEC 1.030 11/23/2007 1052   LABSPEC 1.029 09/01/2007 1500   PHURINE 5.0 09/01/2007 1500   GLUCOSEU NEGATIVE 09/01/2007 1500   HGBUR NEGATIVE 09/01/2007 1500   BILIRUBINUR NEGATIVE 09/01/2007 1500   KETONESUR NEGATIVE 09/01/2007 1500   PROTEINUR NEGATIVE 09/01/2007 1500   UROBILINOGEN 0.2 09/01/2007 1500   NITRITE  NEGATIVE 09/01/2007 1500   LEUKOCYTESUR TRACE* 09/01/2007 1500    STUDIES: No results found.  ASSESSMENT: 78 y.o. [place] woman status post right lumpectomy and axillary lymph node dissection December of 2008 for a T2 N2, Stage III invasive ductal carcinoma, grade 2, estrogen receptor positive at 99%, progesterone receptor positive at 90%, with an MIB-1 of 19%, and HER-2 nonamplified; status post doxorubicin/cyclophosphamide x4 in dose dense fashion followed by paclitaxel weekly x12 followed by radiation completed in August of 2009 at which point she started anastrozole.   PLAN: she will see me again a year from now. At that point we will likely discontinue followup here. I did let her know that some groups are continuing antiestrogen therapy an additional 3 years, beyond the original 5 planned. If we do have more data regarding that we will discuss it at  that time. Otherwise I am delighted that Nathalia is doing so well. She knows to call for any problems that may develop before the next visit.   MAGRINAT,GUSTAV C    05/18/2012

## 2012-09-09 DIAGNOSIS — R82998 Other abnormal findings in urine: Secondary | ICD-10-CM | POA: Diagnosis not present

## 2012-09-09 DIAGNOSIS — E785 Hyperlipidemia, unspecified: Secondary | ICD-10-CM | POA: Diagnosis not present

## 2012-09-13 DIAGNOSIS — C50919 Malignant neoplasm of unspecified site of unspecified female breast: Secondary | ICD-10-CM | POA: Diagnosis not present

## 2012-09-13 DIAGNOSIS — Z23 Encounter for immunization: Secondary | ICD-10-CM | POA: Diagnosis not present

## 2012-09-13 DIAGNOSIS — E785 Hyperlipidemia, unspecified: Secondary | ICD-10-CM | POA: Diagnosis not present

## 2012-09-13 DIAGNOSIS — I10 Essential (primary) hypertension: Secondary | ICD-10-CM | POA: Diagnosis not present

## 2012-09-13 DIAGNOSIS — Z0000 Encounter for general adult medical examination without abnormal findings: Secondary | ICD-10-CM | POA: Diagnosis not present

## 2012-09-13 DIAGNOSIS — Z1331 Encounter for screening for depression: Secondary | ICD-10-CM | POA: Diagnosis not present

## 2012-09-14 ENCOUNTER — Telehealth: Payer: Self-pay | Admitting: *Deleted

## 2012-09-14 ENCOUNTER — Other Ambulatory Visit: Payer: Self-pay | Admitting: *Deleted

## 2012-09-14 DIAGNOSIS — C50919 Malignant neoplasm of unspecified site of unspecified female breast: Secondary | ICD-10-CM

## 2012-09-14 NOTE — Telephone Encounter (Signed)
Gave patient appointment for mammogram at [facility] on 10-06-2012 at 11:00am

## 2012-09-16 DIAGNOSIS — Z1212 Encounter for screening for malignant neoplasm of rectum: Secondary | ICD-10-CM | POA: Diagnosis not present

## 2012-10-01 ENCOUNTER — Other Ambulatory Visit: Payer: Self-pay | Admitting: *Deleted

## 2012-10-06 DIAGNOSIS — Z853 Personal history of malignant neoplasm of breast: Secondary | ICD-10-CM | POA: Diagnosis not present

## 2013-02-23 ENCOUNTER — Other Ambulatory Visit: Payer: Self-pay | Admitting: *Deleted

## 2013-02-23 DIAGNOSIS — C50919 Malignant neoplasm of unspecified site of unspecified female breast: Secondary | ICD-10-CM

## 2013-02-23 MED ORDER — ANASTROZOLE 1 MG PO TABS: 1.0000 mg | ORAL_TABLET | Freq: Every day | ORAL | Status: DC

## 2013-02-23 NOTE — Telephone Encounter (Signed)
Approved anastrozole refill X 90 days till next F/U appointment.

## 2013-03-28 DIAGNOSIS — M1990 Unspecified osteoarthritis, unspecified site: Secondary | ICD-10-CM | POA: Diagnosis not present

## 2013-03-28 DIAGNOSIS — IMO0002 Reserved for concepts with insufficient information to code with codable children: Secondary | ICD-10-CM | POA: Diagnosis not present

## 2013-03-28 DIAGNOSIS — E785 Hyperlipidemia, unspecified: Secondary | ICD-10-CM | POA: Diagnosis not present

## 2013-03-28 DIAGNOSIS — I10 Essential (primary) hypertension: Secondary | ICD-10-CM | POA: Diagnosis not present

## 2013-04-06 DIAGNOSIS — Z853 Personal history of malignant neoplasm of breast: Secondary | ICD-10-CM | POA: Diagnosis not present

## 2013-04-06 DIAGNOSIS — N6489 Other specified disorders of breast: Secondary | ICD-10-CM | POA: Diagnosis not present

## 2013-04-22 DIAGNOSIS — H04129 Dry eye syndrome of unspecified lacrimal gland: Secondary | ICD-10-CM | POA: Diagnosis not present

## 2013-04-22 DIAGNOSIS — H2640 Unspecified secondary cataract: Secondary | ICD-10-CM | POA: Diagnosis not present

## 2013-04-22 DIAGNOSIS — Z961 Presence of intraocular lens: Secondary | ICD-10-CM | POA: Diagnosis not present

## 2013-05-10 ENCOUNTER — Other Ambulatory Visit: Payer: Self-pay | Admitting: Physician Assistant

## 2013-05-10 DIAGNOSIS — C50919 Malignant neoplasm of unspecified site of unspecified female breast: Secondary | ICD-10-CM

## 2013-05-11 ENCOUNTER — Other Ambulatory Visit (HOSPITAL_BASED_OUTPATIENT_CLINIC_OR_DEPARTMENT_OTHER): Payer: Medicare Other | Admitting: Lab

## 2013-05-11 DIAGNOSIS — C50419 Malignant neoplasm of upper-outer quadrant of unspecified female breast: Secondary | ICD-10-CM | POA: Diagnosis not present

## 2013-05-11 DIAGNOSIS — C50919 Malignant neoplasm of unspecified site of unspecified female breast: Secondary | ICD-10-CM

## 2013-05-11 LAB — CBC WITH DIFFERENTIAL/PLATELET
Basophils Absolute: 0 10*3/uL (ref 0.0–0.1)
HCT: 37.7 % (ref 34.8–46.6)
HGB: 12.9 g/dL (ref 11.6–15.9)
MCH: 30.2 pg (ref 25.1–34.0)
MONO#: 0.6 10*3/uL (ref 0.1–0.9)
NEUT%: 68.5 % (ref 38.4–76.8)
Platelets: 174 10*3/uL (ref 145–400)
WBC: 5.6 10*3/uL (ref 3.9–10.3)
lymph#: 1.1 10*3/uL (ref 0.9–3.3)

## 2013-05-11 LAB — COMPREHENSIVE METABOLIC PANEL (CC13)
BUN: 30 mg/dL — ABNORMAL HIGH (ref 7.0–26.0)
CO2: 25 mEq/L (ref 22–29)
Calcium: 10.2 mg/dL (ref 8.4–10.4)
Chloride: 104 mEq/L (ref 98–109)
Creatinine: 1.3 mg/dL — ABNORMAL HIGH (ref 0.6–1.1)

## 2013-05-19 ENCOUNTER — Ambulatory Visit (HOSPITAL_BASED_OUTPATIENT_CLINIC_OR_DEPARTMENT_OTHER): Payer: Medicare Other | Admitting: Oncology

## 2013-05-19 VITALS — BP 136/76 | HR 67 | Temp 98.5°F | Resp 18 | Ht 64.0 in | Wt 142.3 lb

## 2013-05-19 DIAGNOSIS — C50419 Malignant neoplasm of upper-outer quadrant of unspecified female breast: Secondary | ICD-10-CM

## 2013-05-19 DIAGNOSIS — C50411 Malignant neoplasm of upper-outer quadrant of right female breast: Secondary | ICD-10-CM | POA: Insufficient documentation

## 2013-05-19 NOTE — Progress Notes (Signed)
ID: Marissa Carroll   DOB: [birth year]  MR#: [id number]  CSN#:[id number]  PCP: Minda Meo, MD GYN: SU:  OTHER MD:   HISTORY OF PRESENT ILLNESS:  [name] gets yearly mammograms and she had screening mammography October 28 at [facility] showing a question of architectural distortion in the upper outer quadrant of the right breast.  Accordingly, on November 11 she came back for addition views, which showed a new area of abnormality demonstrable by ultrasound and measuring up to 16.2 mm.  With this information the [facility] was [facility], which was performed November 18 and showed ([id number] and [phone]) an invasive ductal carcinoma, which was 99% ER positive, 9% PR positive, 19% on the proliferation marker and negative for HercepTest at 1+.  With this information the patient was referred to Dr. Jamey Ripa and bilateral MRIs were obtained November 26.  This showed a solitary mass in the upper outer quadrant of the right breast measuring up to 2.1 cm.  There was no enlarged axillary or internal mammary adenopathy.  Accordingly, on December 4 the patient proceeded to needle-guided lumpectomy with sentinel lymph node biopsy and since sentinel lymph node was positive she had a full axillary dissection.  The final pathology ([id number]) showed a 2.1 cm grade 2 infiltrating ductal carcinoma with ample margins, evidence of lymphovascular invasion and more importantly 6 out of 14 lymph nodes involved some with extracapsular extension (there were no micrometastatic deposits). Her subsequent history is as detailed below.  INTERVAL HISTORY: [name] returns today for followup of her breast cancer. The interval history is unremarkable. They usually go to [place] after the Christmas holiday and return sometime in April. Otherwise there have not done any significant travel this past year.   REVIEW OF SYSTEMS: She continues to play tennis regularly. She is tolerated the anastrozole  with no side effects it she is aware of. A detailed review of systems today was otherwise entirely negative.  PAST MEDICAL HISTORY: Past Medical History  Diagnosis Date  . Hypertension   . History of breast cancer   Significant for right knee arthroscopy times two under Jim Aplington, status post remote appendectomy, status post carpal tunnel release on the right, status post tendon release on the left, history of tonsillectomy, history of bilateral cataract surgery  PAST SURGICAL HISTORY: Past Surgical History  Procedure Laterality Date  . Breast surgery  2009    lumpectomy  . Knee arthroscopy  10/10    right    FAMILY HISTORY Family History  Problem Relation Age of Onset  . Diabetes Sister     two sisters  The patient's father died at the age of 45 from a myocardial infarction.  The patient's mother died at 16 from cancer of the throat.  The patient has eight brothers and sisters, one brother died with stomach cancer.  GYNECOLOGIC HISTORY: She is Gx, P2, first pregnancy age 74.  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  She and her family are very involved in the [employer] and they attend the [employer].  The patient's husband Marissa Carroll used to own some automobile dealerships.  Their son Marissa Carroll runs a horse farm. iThe patient's daughter 's name is Marissa Carroll.   ADVANCED DIRECTIVES: in place  HEALTH MAINTENANCE: History  Substance Use Topics  . Smoking status: Never Smoker   . Smokeless  tobacco: Not on file  . Alcohol Use: 4.2 oz/week    7 Glasses of wine per week     Colonoscopy:  PAP:  Bone density:  Lipid panel:  No Known Allergies  Current Outpatient Prescriptions  Medication Sig Dispense Refill  . anastrozole (ARIMIDEX) 1 MG tablet Take 1 tablet (1 mg total) by mouth daily.  90 tablet  0  . aspirin 81  MG tablet Take 81 mg by mouth daily.        . Calcium-Vitamin D (CALTRATE 600 PLUS-VIT D PO) Take 2 tablets by mouth daily.        Marland Kitchen triamterene-hydrochlorothiazide (MAXZIDE-25) 37.5-25 MG per tablet        No current facility-administered medications for this visit.    OBJECTIVE: middle-aged white woman in no acute distress Filed Vitals:   05/19/13 1523  BP: 136/76  Pulse: 67  Temp: 98.5 F (36.9 C)  Resp: 18     Body mass index is 24.41 kg/(m^2).    ECOG FS: 0  Sclerae unicteric, pupils equal round and reactive to light Oropharynx clear No cervical or supraclavicular adenopathy Lungs no rales or rhonchi Heart regular rate and rhythm, no murmur appreciated Abd soft, nontender, positive bowel sounds MSK no focal spinal tenderness, no peripheral edema Neuro: nonfocal, well oriented, appropriate affect Breasts: The right breast is status post lumpectomy and radiation. There is no evidence of local recurrence. The right axilla is benign. The left breast is unremarkable.  LAB RESULTS: Lab Results  Component Value Date   WBC 5.6 05/11/2013   NEUTROABS 3.9 05/11/2013   HGB 12.9 05/11/2013   HCT 37.7 05/11/2013   MCV 88.5 05/11/2013   PLT 174 05/11/2013      Chemistry      Component Value Date/Time   NA 140 05/11/2013 1350   NA 137 05/18/2012 1557   K 3.7 05/11/2013 1350   K 3.2* 05/18/2012 1557   CL 101 05/18/2012 1557   CO2 25 05/11/2013 1350   CO2 29 05/18/2012 1557   BUN 30.0* 05/11/2013 1350   BUN 27* 05/18/2012 1557   CREATININE 1.3* 05/11/2013 1350   CREATININE 1.32* 05/18/2012 1557      Component Value Date/Time   CALCIUM 10.2 05/11/2013 1350   CALCIUM 10.1 05/18/2012 1557   ALKPHOS 93 05/11/2013 1350   ALKPHOS 96 05/18/2012 1557   AST 24 05/11/2013 1350   AST 27 05/18/2012 1557   ALT 18 05/11/2013 1350   ALT 20 05/18/2012 1557   BILITOT 0.41 05/11/2013 1350   BILITOT 0.4 05/18/2012 1557       Lab Results  Component Value Date   LABCA2 22 05/18/2012    No components  found with this basename: [id number]    No results found for this basename: INR,  in the last 168 hours  Urinalysis    Component Value Date/Time   COLORURINE YELLOW 09/01/2007 1500   APPEARANCEUR CLEAR 09/01/2007 1500   LABSPEC 1.030 11/23/2007 1052   LABSPEC 1.029 09/01/2007 1500   PHURINE 5.0 09/01/2007 1500   GLUCOSEU NEGATIVE 09/01/2007 1500   HGBUR NEGATIVE 09/01/2007 1500   BILIRUBINUR NEGATIVE 09/01/2007 1500   KETONESUR NEGATIVE 09/01/2007 1500   PROTEINUR NEGATIVE 09/01/2007 1500   UROBILINOGEN 0.2 09/01/2007 1500   NITRITE NEGATIVE 09/01/2007 1500   LEUKOCYTESUR TRACE* 09/01/2007 1500    STUDIES: Right mammogram at [facility] 04/06/2013 showed no change in the slight asymmetry noted in the lateral mid right breast 6 months prior.  ASSESSMENT: 79 y.o.  [place] woman status post right lumpectomy and axillary lymph node dissection December of 2008 for a T2 N2, Stage IIIA invasive ductal carcinoma, grade 2, estrogen receptor positive at 99%, progesterone receptor positive at 90%, with an MIB-1 of 19%, and HER-2 nonamplified;   (1) status post doxorubicin/cyclophosphamide x4 in dose dense fashion followed by paclitaxel weekly x12   (2) adjuvant radiation completed in August of 2009  (3) on anastrozole August of 2009 to August of 2014   PLAN: Tegtmeyer has completed her adjuvant therapy and at this point I am comfortable releasing her to her primary care physician. She is very happy to "graduate" today. She understands we will be glad to see her at any point in the future if the need arises, but as of now no further routine appointments are being made for her here.  She did want me to reschedule her next set of mammograms from January to December, before she goes to [place] for the winter. I will alert her mammographer, Dr. Yolanda Bonine regarding this.  MAGRINAT,GUSTAV C    05/19/2013

## 2013-05-23 ENCOUNTER — Telehealth: Payer: Self-pay | Admitting: Oncology

## 2013-05-23 NOTE — Telephone Encounter (Signed)
Sent letter to Minda Meo MD office from Dr. Darnelle Catalan

## 2013-06-02 DIAGNOSIS — H2640 Unspecified secondary cataract: Secondary | ICD-10-CM | POA: Diagnosis not present

## 2013-06-02 DIAGNOSIS — H26499 Other secondary cataract, unspecified eye: Secondary | ICD-10-CM | POA: Diagnosis not present

## 2013-06-18 ENCOUNTER — Other Ambulatory Visit: Payer: Self-pay | Admitting: Oncology

## 2013-09-12 DIAGNOSIS — R82998 Other abnormal findings in urine: Secondary | ICD-10-CM | POA: Diagnosis not present

## 2013-09-12 DIAGNOSIS — I10 Essential (primary) hypertension: Secondary | ICD-10-CM | POA: Diagnosis not present

## 2013-09-14 DIAGNOSIS — E785 Hyperlipidemia, unspecified: Secondary | ICD-10-CM | POA: Diagnosis not present

## 2013-09-14 DIAGNOSIS — Z23 Encounter for immunization: Secondary | ICD-10-CM | POA: Diagnosis not present

## 2013-09-14 DIAGNOSIS — M1990 Unspecified osteoarthritis, unspecified site: Secondary | ICD-10-CM | POA: Diagnosis not present

## 2013-09-14 DIAGNOSIS — I10 Essential (primary) hypertension: Secondary | ICD-10-CM | POA: Diagnosis not present

## 2013-09-14 DIAGNOSIS — C50919 Malignant neoplasm of unspecified site of unspecified female breast: Secondary | ICD-10-CM | POA: Diagnosis not present

## 2013-09-14 DIAGNOSIS — Z1331 Encounter for screening for depression: Secondary | ICD-10-CM | POA: Diagnosis not present

## 2013-09-14 DIAGNOSIS — Z6828 Body mass index (BMI) 28.0-28.9, adult: Secondary | ICD-10-CM | POA: Diagnosis not present

## 2013-09-14 DIAGNOSIS — Z0000 Encounter for general adult medical examination without abnormal findings: Secondary | ICD-10-CM | POA: Diagnosis not present

## 2013-09-15 DIAGNOSIS — Z23 Encounter for immunization: Secondary | ICD-10-CM | POA: Diagnosis not present

## 2013-10-03 DIAGNOSIS — N6489 Other specified disorders of breast: Secondary | ICD-10-CM | POA: Diagnosis not present

## 2013-10-03 DIAGNOSIS — Z853 Personal history of malignant neoplasm of breast: Secondary | ICD-10-CM | POA: Diagnosis not present

## 2013-10-03 DIAGNOSIS — M899 Disorder of bone, unspecified: Secondary | ICD-10-CM | POA: Diagnosis not present

## 2014-01-18 DIAGNOSIS — H353290 Exudative age-related macular degeneration, unspecified eye, stage unspecified: Secondary | ICD-10-CM | POA: Diagnosis not present

## 2014-01-18 DIAGNOSIS — Z961 Presence of intraocular lens: Secondary | ICD-10-CM | POA: Diagnosis not present

## 2014-01-18 DIAGNOSIS — H04129 Dry eye syndrome of unspecified lacrimal gland: Secondary | ICD-10-CM | POA: Diagnosis not present

## 2014-01-18 DIAGNOSIS — H16109 Unspecified superficial keratitis, unspecified eye: Secondary | ICD-10-CM | POA: Diagnosis not present

## 2014-03-13 DIAGNOSIS — Z1331 Encounter for screening for depression: Secondary | ICD-10-CM | POA: Diagnosis not present

## 2014-03-13 DIAGNOSIS — I10 Essential (primary) hypertension: Secondary | ICD-10-CM | POA: Diagnosis not present

## 2014-03-13 DIAGNOSIS — IMO0002 Reserved for concepts with insufficient information to code with codable children: Secondary | ICD-10-CM | POA: Diagnosis not present

## 2014-03-13 DIAGNOSIS — E785 Hyperlipidemia, unspecified: Secondary | ICD-10-CM | POA: Diagnosis not present

## 2014-04-17 DIAGNOSIS — I10 Essential (primary) hypertension: Secondary | ICD-10-CM | POA: Diagnosis not present

## 2014-04-17 DIAGNOSIS — M1990 Unspecified osteoarthritis, unspecified site: Secondary | ICD-10-CM | POA: Diagnosis not present

## 2014-04-17 DIAGNOSIS — E785 Hyperlipidemia, unspecified: Secondary | ICD-10-CM | POA: Diagnosis not present

## 2014-04-17 DIAGNOSIS — IMO0002 Reserved for concepts with insufficient information to code with codable children: Secondary | ICD-10-CM | POA: Diagnosis not present

## 2014-04-18 DIAGNOSIS — N949 Unspecified condition associated with female genital organs and menstrual cycle: Secondary | ICD-10-CM | POA: Diagnosis not present

## 2014-06-23 DIAGNOSIS — Z23 Encounter for immunization: Secondary | ICD-10-CM | POA: Diagnosis not present

## 2014-08-31 DIAGNOSIS — H3531 Nonexudative age-related macular degeneration: Secondary | ICD-10-CM | POA: Diagnosis not present

## 2014-08-31 DIAGNOSIS — H04123 Dry eye syndrome of bilateral lacrimal glands: Secondary | ICD-10-CM | POA: Diagnosis not present

## 2014-08-31 DIAGNOSIS — Z961 Presence of intraocular lens: Secondary | ICD-10-CM | POA: Diagnosis not present

## 2014-08-31 DIAGNOSIS — H16103 Unspecified superficial keratitis, bilateral: Secondary | ICD-10-CM | POA: Diagnosis not present

## 2014-09-11 DIAGNOSIS — E785 Hyperlipidemia, unspecified: Secondary | ICD-10-CM | POA: Diagnosis not present

## 2014-09-11 DIAGNOSIS — R8299 Other abnormal findings in urine: Secondary | ICD-10-CM | POA: Diagnosis not present

## 2014-09-11 DIAGNOSIS — I10 Essential (primary) hypertension: Secondary | ICD-10-CM | POA: Diagnosis not present

## 2014-09-11 DIAGNOSIS — R8290 Unspecified abnormal findings in urine: Secondary | ICD-10-CM | POA: Diagnosis not present

## 2014-09-15 DIAGNOSIS — Z1212 Encounter for screening for malignant neoplasm of rectum: Secondary | ICD-10-CM | POA: Diagnosis not present

## 2014-09-18 DIAGNOSIS — Z1389 Encounter for screening for other disorder: Secondary | ICD-10-CM | POA: Diagnosis not present

## 2014-09-18 DIAGNOSIS — Z0000 Encounter for general adult medical examination without abnormal findings: Secondary | ICD-10-CM | POA: Diagnosis not present

## 2014-09-18 DIAGNOSIS — M1990 Unspecified osteoarthritis, unspecified site: Secondary | ICD-10-CM | POA: Diagnosis not present

## 2014-09-18 DIAGNOSIS — E785 Hyperlipidemia, unspecified: Secondary | ICD-10-CM | POA: Diagnosis not present

## 2014-09-18 DIAGNOSIS — Z6822 Body mass index (BMI) 22.0-22.9, adult: Secondary | ICD-10-CM | POA: Diagnosis not present

## 2014-09-18 DIAGNOSIS — I10 Essential (primary) hypertension: Secondary | ICD-10-CM | POA: Diagnosis not present

## 2014-10-04 DIAGNOSIS — Z1231 Encounter for screening mammogram for malignant neoplasm of breast: Secondary | ICD-10-CM | POA: Diagnosis not present

## 2014-10-04 DIAGNOSIS — Z853 Personal history of malignant neoplasm of breast: Secondary | ICD-10-CM | POA: Diagnosis not present

## 2014-12-14 ENCOUNTER — Other Ambulatory Visit: Payer: Self-pay | Admitting: Dermatology

## 2014-12-14 DIAGNOSIS — C44311 Basal cell carcinoma of skin of nose: Secondary | ICD-10-CM | POA: Diagnosis not present

## 2014-12-14 DIAGNOSIS — D485 Neoplasm of uncertain behavior of skin: Secondary | ICD-10-CM | POA: Diagnosis not present

## 2014-12-14 DIAGNOSIS — L570 Actinic keratosis: Secondary | ICD-10-CM | POA: Diagnosis not present

## 2015-01-10 DIAGNOSIS — Z85828 Personal history of other malignant neoplasm of skin: Secondary | ICD-10-CM | POA: Diagnosis not present

## 2015-01-10 DIAGNOSIS — C44311 Basal cell carcinoma of skin of nose: Secondary | ICD-10-CM | POA: Diagnosis not present

## 2015-03-01 DIAGNOSIS — H04123 Dry eye syndrome of bilateral lacrimal glands: Secondary | ICD-10-CM | POA: Diagnosis not present

## 2015-03-01 DIAGNOSIS — H43813 Vitreous degeneration, bilateral: Secondary | ICD-10-CM | POA: Diagnosis not present

## 2015-03-01 DIAGNOSIS — Z961 Presence of intraocular lens: Secondary | ICD-10-CM | POA: Diagnosis not present

## 2015-03-01 DIAGNOSIS — H3531 Nonexudative age-related macular degeneration: Secondary | ICD-10-CM | POA: Diagnosis not present

## 2015-07-18 DIAGNOSIS — Z23 Encounter for immunization: Secondary | ICD-10-CM | POA: Diagnosis not present

## 2015-07-27 DIAGNOSIS — D2272 Melanocytic nevi of left lower limb, including hip: Secondary | ICD-10-CM | POA: Diagnosis not present

## 2015-07-27 DIAGNOSIS — D1801 Hemangioma of skin and subcutaneous tissue: Secondary | ICD-10-CM | POA: Diagnosis not present

## 2015-07-27 DIAGNOSIS — D2262 Melanocytic nevi of left upper limb, including shoulder: Secondary | ICD-10-CM | POA: Diagnosis not present

## 2015-07-27 DIAGNOSIS — L812 Freckles: Secondary | ICD-10-CM | POA: Diagnosis not present

## 2015-07-27 DIAGNOSIS — D2261 Melanocytic nevi of right upper limb, including shoulder: Secondary | ICD-10-CM | POA: Diagnosis not present

## 2015-07-27 DIAGNOSIS — Z85828 Personal history of other malignant neoplasm of skin: Secondary | ICD-10-CM | POA: Diagnosis not present

## 2015-07-27 DIAGNOSIS — D225 Melanocytic nevi of trunk: Secondary | ICD-10-CM | POA: Diagnosis not present

## 2015-07-27 DIAGNOSIS — L821 Other seborrheic keratosis: Secondary | ICD-10-CM | POA: Diagnosis not present

## 2015-07-27 DIAGNOSIS — D2271 Melanocytic nevi of right lower limb, including hip: Secondary | ICD-10-CM | POA: Diagnosis not present

## 2015-09-14 DIAGNOSIS — R8290 Unspecified abnormal findings in urine: Secondary | ICD-10-CM | POA: Diagnosis not present

## 2015-09-14 DIAGNOSIS — N390 Urinary tract infection, site not specified: Secondary | ICD-10-CM | POA: Diagnosis not present

## 2015-09-14 DIAGNOSIS — I10 Essential (primary) hypertension: Secondary | ICD-10-CM | POA: Diagnosis not present

## 2015-09-21 DIAGNOSIS — Z0000 Encounter for general adult medical examination without abnormal findings: Secondary | ICD-10-CM | POA: Diagnosis not present

## 2015-09-21 DIAGNOSIS — M1990 Unspecified osteoarthritis, unspecified site: Secondary | ICD-10-CM | POA: Diagnosis not present

## 2015-09-21 DIAGNOSIS — Z6822 Body mass index (BMI) 22.0-22.9, adult: Secondary | ICD-10-CM | POA: Diagnosis not present

## 2015-09-21 DIAGNOSIS — E784 Other hyperlipidemia: Secondary | ICD-10-CM | POA: Diagnosis not present

## 2015-09-21 DIAGNOSIS — C50919 Malignant neoplasm of unspecified site of unspecified female breast: Secondary | ICD-10-CM | POA: Diagnosis not present

## 2015-09-21 DIAGNOSIS — I10 Essential (primary) hypertension: Secondary | ICD-10-CM | POA: Diagnosis not present

## 2015-10-18 DIAGNOSIS — M85852 Other specified disorders of bone density and structure, left thigh: Secondary | ICD-10-CM | POA: Diagnosis not present

## 2015-10-18 DIAGNOSIS — Z1231 Encounter for screening mammogram for malignant neoplasm of breast: Secondary | ICD-10-CM | POA: Diagnosis not present

## 2016-03-10 DIAGNOSIS — Z0100 Encounter for examination of eyes and vision without abnormal findings: Secondary | ICD-10-CM | POA: Diagnosis not present

## 2016-03-10 DIAGNOSIS — H353122 Nonexudative age-related macular degeneration, left eye, intermediate dry stage: Secondary | ICD-10-CM | POA: Diagnosis not present

## 2016-03-10 DIAGNOSIS — H04123 Dry eye syndrome of bilateral lacrimal glands: Secondary | ICD-10-CM | POA: Diagnosis not present

## 2016-03-10 DIAGNOSIS — H353112 Nonexudative age-related macular degeneration, right eye, intermediate dry stage: Secondary | ICD-10-CM | POA: Diagnosis not present

## 2016-07-18 DIAGNOSIS — Z23 Encounter for immunization: Secondary | ICD-10-CM | POA: Diagnosis not present

## 2016-10-01 DIAGNOSIS — I10 Essential (primary) hypertension: Secondary | ICD-10-CM | POA: Diagnosis not present

## 2016-10-01 DIAGNOSIS — N390 Urinary tract infection, site not specified: Secondary | ICD-10-CM | POA: Diagnosis not present

## 2016-10-01 DIAGNOSIS — R8290 Unspecified abnormal findings in urine: Secondary | ICD-10-CM | POA: Diagnosis not present

## 2016-10-01 DIAGNOSIS — E784 Other hyperlipidemia: Secondary | ICD-10-CM | POA: Diagnosis not present

## 2016-10-20 DIAGNOSIS — Z853 Personal history of malignant neoplasm of breast: Secondary | ICD-10-CM | POA: Diagnosis not present

## 2016-10-20 DIAGNOSIS — Z1231 Encounter for screening mammogram for malignant neoplasm of breast: Secondary | ICD-10-CM | POA: Diagnosis not present

## 2016-11-04 DIAGNOSIS — Z1212 Encounter for screening for malignant neoplasm of rectum: Secondary | ICD-10-CM | POA: Diagnosis not present

## 2017-02-18 DIAGNOSIS — H353131 Nonexudative age-related macular degeneration, bilateral, early dry stage: Secondary | ICD-10-CM | POA: Diagnosis not present

## 2017-02-18 DIAGNOSIS — Z961 Presence of intraocular lens: Secondary | ICD-10-CM | POA: Diagnosis not present

## 2017-02-18 DIAGNOSIS — H52203 Unspecified astigmatism, bilateral: Secondary | ICD-10-CM | POA: Diagnosis not present

## 2017-02-18 DIAGNOSIS — H04123 Dry eye syndrome of bilateral lacrimal glands: Secondary | ICD-10-CM | POA: Diagnosis not present

## 2017-07-24 DIAGNOSIS — Z23 Encounter for immunization: Secondary | ICD-10-CM | POA: Diagnosis not present

## 2017-10-01 DIAGNOSIS — M8589 Other specified disorders of bone density and structure, multiple sites: Secondary | ICD-10-CM | POA: Diagnosis not present

## 2017-10-01 DIAGNOSIS — Z853 Personal history of malignant neoplasm of breast: Secondary | ICD-10-CM | POA: Diagnosis not present

## 2017-10-01 DIAGNOSIS — Z1231 Encounter for screening mammogram for malignant neoplasm of breast: Secondary | ICD-10-CM | POA: Diagnosis not present

## 2017-10-07 DIAGNOSIS — E7849 Other hyperlipidemia: Secondary | ICD-10-CM | POA: Diagnosis not present

## 2017-10-07 DIAGNOSIS — R82998 Other abnormal findings in urine: Secondary | ICD-10-CM | POA: Diagnosis not present

## 2017-10-07 DIAGNOSIS — I10 Essential (primary) hypertension: Secondary | ICD-10-CM | POA: Diagnosis not present

## 2017-10-13 DIAGNOSIS — Z1212 Encounter for screening for malignant neoplasm of rectum: Secondary | ICD-10-CM | POA: Diagnosis not present

## 2017-11-13 NOTE — Progress Notes (Signed)
This encounter was created in error - please disregard.

## 2018-02-25 DIAGNOSIS — I10 Essential (primary) hypertension: Secondary | ICD-10-CM | POA: Diagnosis not present

## 2018-02-25 DIAGNOSIS — Z6821 Body mass index (BMI) 21.0-21.9, adult: Secondary | ICD-10-CM | POA: Diagnosis not present

## 2018-03-19 ENCOUNTER — Encounter: Payer: Self-pay | Admitting: Podiatry

## 2018-03-19 ENCOUNTER — Ambulatory Visit (INDEPENDENT_AMBULATORY_CARE_PROVIDER_SITE_OTHER): Payer: Medicare Other | Admitting: Podiatry

## 2018-03-19 ENCOUNTER — Ambulatory Visit (INDEPENDENT_AMBULATORY_CARE_PROVIDER_SITE_OTHER): Payer: Medicare Other

## 2018-03-19 VITALS — BP 145/89 | HR 60 | Resp 16

## 2018-03-19 DIAGNOSIS — M779 Enthesopathy, unspecified: Secondary | ICD-10-CM

## 2018-03-19 DIAGNOSIS — M722 Plantar fascial fibromatosis: Secondary | ICD-10-CM

## 2018-03-19 MED ORDER — TRIAMCINOLONE ACETONIDE 10 MG/ML IJ SUSP
10.0000 mg | Freq: Once | INTRAMUSCULAR | Status: AC
  Administered 2018-03-19: 10 mg

## 2018-03-19 NOTE — Progress Notes (Signed)
Subjective:   Patient ID: Marissa Carroll, female   DOB: 84 y.o.   MRN: [id number]   HPI Patient presents with pain in the right foot around the navicular.  States is been present for several weeks and is gotten gradually worse over the time it is making it hard for her to walk and she is very active and does not smoke   Review of Systems  All other systems reviewed and are negative.       Objective:  Physical Exam  Constitutional: She appears well-developed and well-nourished.  Cardiovascular: Intact distal pulses.  Pulmonary/Chest: Effort normal.  Musculoskeletal: Normal range of motion.  Neurological: She is alert.  Skin: Skin is warm.  Nursing note and vitals reviewed.   Neurovascular status intact muscle strength is adequate range of motion within normal limits with patient found to have inflammation and pain around the posterior tibial navicular insertion with fluid buildup around this area and mild depression of the arch.  Patient is found to have good digital perfusion well oriented x3     Assessment:  Tendinitis of the posterior tibial tendon at its insertion navicular right with mild depression of the arch     Plan:  H&P condition reviewed x-ray reviewed and today I carefully injected the sheath insertion into the navicular 3 mg Kenalog 5 mg Xylocaine after explaining risk.  I then applied fascial brace to provide support for the arch instructed on ice and reappoint  X-ray indicated mild depression of the arch but no indications of arthritis or other pathology

## 2018-03-19 NOTE — Progress Notes (Signed)
   Subjective:    Patient ID: Marissa Carroll, female    DOB: 03-07-1928, 84 y.o.   MRN: [id number]  HPI    Review of Systems  All other systems reviewed and are negative.      Objective:   Physical Exam        Assessment & Plan:

## 2018-04-20 DIAGNOSIS — C44612 Basal cell carcinoma of skin of right upper limb, including shoulder: Secondary | ICD-10-CM | POA: Diagnosis not present

## 2018-04-20 DIAGNOSIS — Z85828 Personal history of other malignant neoplasm of skin: Secondary | ICD-10-CM | POA: Diagnosis not present

## 2018-04-20 DIAGNOSIS — D485 Neoplasm of uncertain behavior of skin: Secondary | ICD-10-CM | POA: Diagnosis not present

## 2018-04-21 DIAGNOSIS — M25572 Pain in left ankle and joints of left foot: Secondary | ICD-10-CM | POA: Diagnosis not present

## 2018-04-21 DIAGNOSIS — M79672 Pain in left foot: Secondary | ICD-10-CM | POA: Diagnosis not present

## 2018-04-21 DIAGNOSIS — Q667 Congenital pes cavus: Secondary | ICD-10-CM | POA: Diagnosis not present

## 2018-04-21 DIAGNOSIS — M898X7 Other specified disorders of bone, ankle and foot: Secondary | ICD-10-CM | POA: Diagnosis not present

## 2018-04-21 DIAGNOSIS — M722 Plantar fascial fibromatosis: Secondary | ICD-10-CM | POA: Diagnosis not present

## 2018-04-23 DIAGNOSIS — H43813 Vitreous degeneration, bilateral: Secondary | ICD-10-CM | POA: Diagnosis not present

## 2018-04-23 DIAGNOSIS — H353131 Nonexudative age-related macular degeneration, bilateral, early dry stage: Secondary | ICD-10-CM | POA: Diagnosis not present

## 2018-04-23 DIAGNOSIS — H26492 Other secondary cataract, left eye: Secondary | ICD-10-CM | POA: Diagnosis not present

## 2018-04-23 DIAGNOSIS — H52203 Unspecified astigmatism, bilateral: Secondary | ICD-10-CM | POA: Diagnosis not present

## 2018-05-12 DIAGNOSIS — M79672 Pain in left foot: Secondary | ICD-10-CM | POA: Diagnosis not present

## 2018-05-12 DIAGNOSIS — M722 Plantar fascial fibromatosis: Secondary | ICD-10-CM | POA: Diagnosis not present

## 2018-06-01 DIAGNOSIS — L988 Other specified disorders of the skin and subcutaneous tissue: Secondary | ICD-10-CM | POA: Diagnosis not present

## 2018-06-01 DIAGNOSIS — Z85828 Personal history of other malignant neoplasm of skin: Secondary | ICD-10-CM | POA: Diagnosis not present

## 2018-06-01 DIAGNOSIS — C44612 Basal cell carcinoma of skin of right upper limb, including shoulder: Secondary | ICD-10-CM | POA: Diagnosis not present

## 2018-06-02 DIAGNOSIS — M722 Plantar fascial fibromatosis: Secondary | ICD-10-CM | POA: Diagnosis not present

## 2018-06-02 DIAGNOSIS — M79672 Pain in left foot: Secondary | ICD-10-CM | POA: Diagnosis not present

## 2018-06-09 DIAGNOSIS — I10 Essential (primary) hypertension: Secondary | ICD-10-CM | POA: Diagnosis not present

## 2018-06-13 NOTE — Progress Notes (Signed)
Cardiology Office Note:    Date:  06/14/2018   ID:  Marissa Carroll, DOB 10/31/1927, MRN [id number]  PCP:  Burnard Bunting, MD  Cardiologist:  Shirlee More, MD   Referring MD: Burnard Bunting, MD  ASSESSMENT:    1. LBBB (left bundle branch block)   2. Essential hypertension   3. Personal history of chemotherapy    PLAN:    In order of problems listed above:  1. In the setting of new exertional shortness of breath raises concerns for cardiomyopathy echocardiogram ordered and if she has abnormalities of diastolic function consistent with elevated filling pressures were reduced left ventricular function will bring her back to my office for further evaluation and treatment.  Normal EKG I can find in her chart is from many years ago without left bundle branch block. 2. Stable continue her thiazide diuretic 3. Received Adriamycin echocardiogram in 2008 showed EF of 60 -65% at risk for late chemotherapy-induced cardiomyopathy undergo echocardiogram to guide treatment including systolic function diastolic indices and global longitudinal strain.  If abnormal I will bring her back to my office for follow-up and treatment  Next appointment   Medication Adjustments/Labs and Tests Ordered: Current medicines are reviewed at length with the patient today.  Concerns regarding medicines are outlined above.  Orders Placed This Encounter  Procedures  . EKG 12-Lead  . ECHOCARDIOGRAM COMPLETE   No orders of the defined types were placed in this encounter.    No chief complaint on file.   History of Present Illness:    Marissa Carroll is a 84 y.o. female who is being seen today for the evaluation of LBBB at the request of Burnard Bunting, MD.  She feels under intense stress caring for her husband with dementia.  She has no history of heart disease and her recent EKG shows a left bundle branch block.  She is at risk for cardiomyopathy with breast cancer surgery XRT and chemotherapy right  breast and had a normal echocardiogram in 2008.  Nothing profound is happened but she is aware of shortness of breath when she plays tennis now and she climbs stairs as needed but not severe or limiting.  No chest pain palpitation edema orthopnea or syncope.  She has no history of lung disease is a non-smoker.  No cough or wheezing.  Seen by me in the office I reviewed the findings of significance of her breast cancer and Adriamycin treatment and advised her to undergo echocardiogram she agrees if abnormal bring her back to the office for further evaluation and treatment if normal plan to see back in the office as needed.  At this time I do not think she requires an ischemia evaluation.  The other alternative is that this is an age-related finding not uncommon at age 3.  Past Medical History:  Diagnosis Date  . History of breast cancer   . Hypertension     Past Surgical History:  Procedure Laterality Date  . BREAST SURGERY  2009   lumpectomy  . CARPAL TUNNEL RELEASE    . KNEE ARTHROSCOPY  10/10   right    Current Medications: Current Meds  Medication Sig  . Calcium-Vitamin D (CALTRATE 600 PLUS-VIT D PO) Take 2 tablets by mouth daily.    . Multiple Vitamins-Minerals (EYE VITAMINS PO) Take 1 tablet by mouth daily.   Marland Kitchen triamterene-hydrochlorothiazide (MAXZIDE-25) 37.5-25 MG per tablet Take 1 tablet by mouth daily.      Allergies:   Patient has no known allergies.  Social History   Socioeconomic History  . Marital status: Married    Spouse name: Not on file  . Number of children: Not on file  . Years of education: Not on file  . Highest education level: Not on file  Occupational History  . Not on file  Social Needs  . Financial resource strain: Not on file  . Food insecurity:    Worry: Not on file    Inability: Not on file  . Transportation needs:    Medical: Not on file    Non-medical: Not on file  Tobacco Use  . Smoking status: Never Smoker  . Smokeless tobacco: Never  Used  Substance and Sexual Activity  . Alcohol use: Yes    Alcohol/week: 7.0 standard drinks    Types: 7 Glasses of wine per week    Comment: 1 glass of wine everyday   . Drug use: Never  . Sexual activity: Not on file  Lifestyle  . Physical activity:    Days per week: Not on file    Minutes per session: Not on file  . Stress: Not on file  Relationships  . Social connections:    Talks on phone: Not on file    Gets together: Not on file    Attends religious service: Not on file    Active member of club or organization: Not on file    Attends meetings of clubs or organizations: Not on file    Relationship status: Not on file  Other Topics Concern  . Not on file  Social History Narrative  . Not on file     Family History: The patient's family history includes Cancer in her mother; Diabetes in her sister; Heart attack in her father and son.  ROS:   Review of Systems  Constitution: Negative.  HENT: Negative.   Eyes: Negative.   Cardiovascular: Positive for dyspnea on exertion.  Respiratory: Positive for shortness of breath (playing tennis and stairs).   Endocrine: Negative.   Hematologic/Lymphatic: Negative.   Skin: Negative.   Musculoskeletal: Positive for joint pain (feet).  Gastrointestinal: Negative.   Genitourinary: Negative.   Neurological: Negative.   Psychiatric/Behavioral: Negative.   Allergic/Immunologic: Negative.    Please see the history of present illness.     All other systems reviewed and are negative.  EKGs/Labs/Other Studies Reviewed:    The following studies were reviewed today:   EKG:  EKG is  ordered today.  The ekg ordered today demonstrates sinus rhythm left bundle branch block  Recent Labs: No results found for requested labs within last 8760 hours.  Recent Lipid Panel No results found for: CHOL, TRIG, HDL, CHOLHDL, VLDL, LDLCALC, LDLDIRECT  Physical Exam:    VS:  BP (!) 144/80 (BP Location: Left Arm, Patient Position: Sitting, Cuff  Size: Normal)   Pulse 66   Ht 5\' 5"  (1.651 m)   Wt 133 lb (60.3 kg)   SpO2 98%   BMI 22.13 kg/m     Wt Readings from Last 3 Encounters:  06/14/18 133 lb (60.3 kg)  05/19/13 142 lb 4.8 oz (64.5 kg)  05/18/12 145 lb 12.8 oz (66.1 kg)     GEN:  Well nourished, well developed in no acute distress HEENT: Normal NECK: No JVD; No carotid bruits LYMPHATICS: No lymphadenopathy CARDIAC: Paradoxical second heart sound RRR, no murmurs, rubs, gallops RESPIRATORY:  Clear to auscultation without rales, wheezing or rhonchi  ABDOMEN: Soft, non-tender, non-distended MUSCULOSKELETAL:  No edema; No deformity  SKIN: Warm and  dry NEUROLOGIC:  Alert and oriented x 3 PSYCHIATRIC:  Normal affect     Signed, Shirlee More, MD  06/14/2018 11:19 AM    [place]

## 2018-06-14 ENCOUNTER — Encounter: Payer: Self-pay | Admitting: Cardiology

## 2018-06-14 ENCOUNTER — Ambulatory Visit (INDEPENDENT_AMBULATORY_CARE_PROVIDER_SITE_OTHER): Payer: Medicare Other | Admitting: Cardiology

## 2018-06-14 VITALS — BP 144/80 | HR 66 | Ht 65.0 in | Wt 133.0 lb

## 2018-06-14 DIAGNOSIS — I447 Left bundle-branch block, unspecified: Secondary | ICD-10-CM | POA: Diagnosis not present

## 2018-06-14 DIAGNOSIS — I10 Essential (primary) hypertension: Secondary | ICD-10-CM | POA: Diagnosis not present

## 2018-06-14 DIAGNOSIS — Z9221 Personal history of antineoplastic chemotherapy: Secondary | ICD-10-CM | POA: Insufficient documentation

## 2018-06-14 NOTE — Patient Instructions (Addendum)
Medication Instructions:  Your physician recommends that you continue on your current medications as directed. Please refer to the Current Medication list given to you today.  Labwork: None  Testing/Procedures: You had an EKG today.   Your physician has requested that you have an echocardiogram. Echocardiography is a painless test that uses sound waves to create images of your heart. It provides your doctor with information about the size and shape of your heart and how well your heart's chambers and valves are working. This procedure takes approximately one hour. There are no restrictions for this procedure.  Follow-Up: Your physician recommends that you schedule a follow-up appointment as needed if symptoms worsen or fail to improve.  If you need a refill on your cardiac medications before your next appointment, please call your pharmacy.   Thank you for choosing [facility]! Robyne Peers, RN [phone]   Echocardiogram An echocardiogram, or echocardiography, uses sound waves (ultrasound) to produce an image of your heart. The echocardiogram is simple, painless, obtained within a short period of time, and offers valuable information to your health care provider. The images from an echocardiogram can provide information such as:  Evidence of coronary artery disease (CAD).  Heart size.  Heart muscle function.  Heart valve function.  Aneurysm detection.  Evidence of a past heart attack.  Fluid buildup around the heart.  Heart muscle thickening.  Assess heart valve function.  Tell a health care provider about:  Any allergies you have.  All medicines you are taking, including vitamins, herbs, eye drops, creams, and over-the-counter medicines.  Any problems you or family members have had with anesthetic medicines.  Any blood disorders you have.  Any surgeries you have had.  Any medical conditions you have.  Whether you are pregnant or may be  pregnant. What happens before the procedure? No special preparation is needed. Eat and drink normally. What happens during the procedure?  In order to produce an image of your heart, gel will be applied to your chest and a wand-like tool (transducer) will be moved over your chest. The gel will help transmit the sound waves from the transducer. The sound waves will harmlessly bounce off your heart to allow the heart images to be captured in real-time motion. These images will then be recorded.  You may need an IV to receive a medicine that improves the quality of the pictures. What happens after the procedure? You may return to your normal schedule including diet, activities, and medicines, unless your health care provider tells you otherwise. This information is not intended to replace advice given to you by your health care provider. Make sure you discuss any questions you have with your health care provider. Document Released: 09/12/2000 Document Revised: 05/03/2016 Document Reviewed: 05/23/2013 Elsevier Interactive Patient Education  2017 [employer].

## 2018-06-16 DIAGNOSIS — Z23 Encounter for immunization: Secondary | ICD-10-CM | POA: Diagnosis not present

## 2018-06-23 ENCOUNTER — Ambulatory Visit (HOSPITAL_BASED_OUTPATIENT_CLINIC_OR_DEPARTMENT_OTHER)
Admission: RE | Admit: 2018-06-23 | Discharge: 2018-06-23 | Disposition: A | Discharge status: Home / Self Care | Payer: Medicare Other | Source: Ambulatory Visit | Attending: Cardiology | Admitting: Cardiology

## 2018-06-23 DIAGNOSIS — I447 Left bundle-branch block, unspecified: Secondary | ICD-10-CM | POA: Diagnosis not present

## 2018-06-23 DIAGNOSIS — I10 Essential (primary) hypertension: Secondary | ICD-10-CM | POA: Diagnosis not present

## 2018-06-23 DIAGNOSIS — R9431 Abnormal electrocardiogram [ECG] [EKG]: Secondary | ICD-10-CM | POA: Insufficient documentation

## 2018-06-23 DIAGNOSIS — Z9221 Personal history of antineoplastic chemotherapy: Secondary | ICD-10-CM | POA: Diagnosis not present

## 2018-06-23 NOTE — Progress Notes (Signed)
  Echocardiogram 2D Echocardiogram has been performed.  Darlina Sicilian M 06/23/2018, 9:42 AM

## 2018-08-30 DIAGNOSIS — M79671 Pain in right foot: Secondary | ICD-10-CM | POA: Diagnosis not present

## 2018-08-30 DIAGNOSIS — M25571 Pain in right ankle and joints of right foot: Secondary | ICD-10-CM | POA: Diagnosis not present

## 2018-08-30 DIAGNOSIS — M722 Plantar fascial fibromatosis: Secondary | ICD-10-CM | POA: Diagnosis not present

## 2018-09-14 DIAGNOSIS — M722 Plantar fascial fibromatosis: Secondary | ICD-10-CM | POA: Diagnosis not present

## 2018-09-14 DIAGNOSIS — M25571 Pain in right ankle and joints of right foot: Secondary | ICD-10-CM | POA: Diagnosis not present

## 2018-10-05 DIAGNOSIS — Z1231 Encounter for screening mammogram for malignant neoplasm of breast: Secondary | ICD-10-CM | POA: Diagnosis not present

## 2018-10-05 DIAGNOSIS — Z853 Personal history of malignant neoplasm of breast: Secondary | ICD-10-CM | POA: Diagnosis not present

## 2019-01-24 DIAGNOSIS — I10 Essential (primary) hypertension: Secondary | ICD-10-CM | POA: Diagnosis not present

## 2019-01-24 DIAGNOSIS — E7849 Other hyperlipidemia: Secondary | ICD-10-CM | POA: Diagnosis not present

## 2019-01-31 DIAGNOSIS — R82998 Other abnormal findings in urine: Secondary | ICD-10-CM | POA: Diagnosis not present

## 2019-01-31 DIAGNOSIS — Z0000 Encounter for general adult medical examination without abnormal findings: Secondary | ICD-10-CM | POA: Diagnosis not present

## 2019-01-31 DIAGNOSIS — I10 Essential (primary) hypertension: Secondary | ICD-10-CM | POA: Diagnosis not present

## 2019-03-01 DIAGNOSIS — M722 Plantar fascial fibromatosis: Secondary | ICD-10-CM | POA: Diagnosis not present

## 2019-03-01 DIAGNOSIS — L03031 Cellulitis of right toe: Secondary | ICD-10-CM | POA: Diagnosis not present

## 2019-03-01 DIAGNOSIS — M792 Neuralgia and neuritis, unspecified: Secondary | ICD-10-CM | POA: Diagnosis not present

## 2019-03-01 DIAGNOSIS — M898X7 Other specified disorders of bone, ankle and foot: Secondary | ICD-10-CM | POA: Diagnosis not present

## 2019-03-07 DIAGNOSIS — H26492 Other secondary cataract, left eye: Secondary | ICD-10-CM | POA: Diagnosis not present

## 2019-03-07 DIAGNOSIS — H353131 Nonexudative age-related macular degeneration, bilateral, early dry stage: Secondary | ICD-10-CM | POA: Diagnosis not present

## 2019-03-07 DIAGNOSIS — H04123 Dry eye syndrome of bilateral lacrimal glands: Secondary | ICD-10-CM | POA: Diagnosis not present

## 2019-03-07 DIAGNOSIS — H52203 Unspecified astigmatism, bilateral: Secondary | ICD-10-CM | POA: Diagnosis not present

## 2019-03-15 DIAGNOSIS — M722 Plantar fascial fibromatosis: Secondary | ICD-10-CM | POA: Diagnosis not present

## 2019-03-15 DIAGNOSIS — M79674 Pain in right toe(s): Secondary | ICD-10-CM | POA: Diagnosis not present

## 2019-03-15 DIAGNOSIS — M898X7 Other specified disorders of bone, ankle and foot: Secondary | ICD-10-CM | POA: Diagnosis not present

## 2019-03-15 DIAGNOSIS — L03031 Cellulitis of right toe: Secondary | ICD-10-CM | POA: Diagnosis not present

## 2019-03-15 DIAGNOSIS — L02611 Cutaneous abscess of right foot: Secondary | ICD-10-CM | POA: Diagnosis not present

## 2019-03-29 DIAGNOSIS — M898X7 Other specified disorders of bone, ankle and foot: Secondary | ICD-10-CM | POA: Diagnosis not present

## 2019-03-29 DIAGNOSIS — L03031 Cellulitis of right toe: Secondary | ICD-10-CM | POA: Diagnosis not present

## 2019-03-29 DIAGNOSIS — M25572 Pain in left ankle and joints of left foot: Secondary | ICD-10-CM | POA: Diagnosis not present

## 2019-04-08 DIAGNOSIS — M722 Plantar fascial fibromatosis: Secondary | ICD-10-CM | POA: Diagnosis not present

## 2019-06-15 DIAGNOSIS — Z23 Encounter for immunization: Secondary | ICD-10-CM | POA: Diagnosis not present

## 2019-08-08 DIAGNOSIS — Z1339 Encounter for screening examination for other mental health and behavioral disorders: Secondary | ICD-10-CM | POA: Diagnosis not present

## 2019-08-08 DIAGNOSIS — C50919 Malignant neoplasm of unspecified site of unspecified female breast: Secondary | ICD-10-CM | POA: Diagnosis not present

## 2019-08-08 DIAGNOSIS — M1990 Unspecified osteoarthritis, unspecified site: Secondary | ICD-10-CM | POA: Diagnosis not present

## 2019-08-08 DIAGNOSIS — E785 Hyperlipidemia, unspecified: Secondary | ICD-10-CM | POA: Diagnosis not present

## 2019-08-08 DIAGNOSIS — I10 Essential (primary) hypertension: Secondary | ICD-10-CM | POA: Diagnosis not present

## 2019-08-08 DIAGNOSIS — Z1331 Encounter for screening for depression: Secondary | ICD-10-CM | POA: Diagnosis not present

## 2019-09-06 DIAGNOSIS — D2239 Melanocytic nevi of other parts of face: Secondary | ICD-10-CM | POA: Diagnosis not present

## 2019-09-06 DIAGNOSIS — D485 Neoplasm of uncertain behavior of skin: Secondary | ICD-10-CM | POA: Diagnosis not present

## 2019-09-06 DIAGNOSIS — Z85828 Personal history of other malignant neoplasm of skin: Secondary | ICD-10-CM | POA: Diagnosis not present

## 2019-10-03 DIAGNOSIS — M1990 Unspecified osteoarthritis, unspecified site: Secondary | ICD-10-CM | POA: Diagnosis not present

## 2019-10-03 DIAGNOSIS — Z20822 Contact with and (suspected) exposure to covid-19: Secondary | ICD-10-CM | POA: Diagnosis not present

## 2019-10-03 DIAGNOSIS — E785 Hyperlipidemia, unspecified: Secondary | ICD-10-CM | POA: Diagnosis not present

## 2019-10-03 DIAGNOSIS — Z1152 Encounter for screening for COVID-19: Secondary | ICD-10-CM | POA: Diagnosis not present

## 2019-10-03 DIAGNOSIS — I10 Essential (primary) hypertension: Secondary | ICD-10-CM | POA: Diagnosis not present

## 2019-10-03 DIAGNOSIS — C50919 Malignant neoplasm of unspecified site of unspecified female breast: Secondary | ICD-10-CM | POA: Diagnosis not present

## 2019-11-14 DIAGNOSIS — I82411 Acute embolism and thrombosis of right femoral vein: Secondary | ICD-10-CM | POA: Diagnosis not present

## 2019-11-14 DIAGNOSIS — M79604 Pain in right leg: Secondary | ICD-10-CM | POA: Insufficient documentation

## 2019-11-14 DIAGNOSIS — I8290 Acute embolism and thrombosis of unspecified vein: Secondary | ICD-10-CM | POA: Insufficient documentation

## 2019-11-14 DIAGNOSIS — R600 Localized edema: Secondary | ICD-10-CM | POA: Insufficient documentation

## 2019-11-15 DIAGNOSIS — I8290 Acute embolism and thrombosis of unspecified vein: Secondary | ICD-10-CM | POA: Diagnosis not present

## 2019-11-15 DIAGNOSIS — Z20828 Contact with and (suspected) exposure to other viral communicable diseases: Secondary | ICD-10-CM | POA: Diagnosis not present

## 2019-11-15 DIAGNOSIS — M79604 Pain in right leg: Secondary | ICD-10-CM | POA: Diagnosis not present

## 2019-11-16 DIAGNOSIS — R0600 Dyspnea, unspecified: Secondary | ICD-10-CM | POA: Insufficient documentation

## 2019-11-16 DIAGNOSIS — K5730 Diverticulosis of large intestine without perforation or abscess without bleeding: Secondary | ICD-10-CM | POA: Diagnosis not present

## 2019-11-16 DIAGNOSIS — I2699 Other pulmonary embolism without acute cor pulmonale: Secondary | ICD-10-CM | POA: Diagnosis not present

## 2019-11-16 DIAGNOSIS — I8290 Acute embolism and thrombosis of unspecified vein: Secondary | ICD-10-CM | POA: Diagnosis not present

## 2019-12-03 DIAGNOSIS — Z23 Encounter for immunization: Secondary | ICD-10-CM | POA: Diagnosis not present

## 2020-01-04 DIAGNOSIS — Z23 Encounter for immunization: Secondary | ICD-10-CM | POA: Diagnosis not present

## 2020-01-31 DIAGNOSIS — E7849 Other hyperlipidemia: Secondary | ICD-10-CM | POA: Diagnosis not present

## 2020-01-31 DIAGNOSIS — M8589 Other specified disorders of bone density and structure, multiple sites: Secondary | ICD-10-CM | POA: Diagnosis not present

## 2020-01-31 DIAGNOSIS — M810 Age-related osteoporosis without current pathological fracture: Secondary | ICD-10-CM | POA: Diagnosis not present

## 2020-02-02 DIAGNOSIS — I10 Essential (primary) hypertension: Secondary | ICD-10-CM | POA: Diagnosis not present

## 2020-02-02 DIAGNOSIS — R82998 Other abnormal findings in urine: Secondary | ICD-10-CM | POA: Diagnosis not present

## 2020-02-06 DIAGNOSIS — Z853 Personal history of malignant neoplasm of breast: Secondary | ICD-10-CM | POA: Insufficient documentation

## 2020-02-06 DIAGNOSIS — N281 Cyst of kidney, acquired: Secondary | ICD-10-CM | POA: Insufficient documentation

## 2020-02-09 DIAGNOSIS — Z1212 Encounter for screening for malignant neoplasm of rectum: Secondary | ICD-10-CM | POA: Diagnosis not present

## 2020-03-15 DIAGNOSIS — H52203 Unspecified astigmatism, bilateral: Secondary | ICD-10-CM | POA: Diagnosis not present

## 2020-03-15 DIAGNOSIS — H353131 Nonexudative age-related macular degeneration, bilateral, early dry stage: Secondary | ICD-10-CM | POA: Diagnosis not present

## 2020-03-15 DIAGNOSIS — H04123 Dry eye syndrome of bilateral lacrimal glands: Secondary | ICD-10-CM | POA: Diagnosis not present

## 2020-03-15 DIAGNOSIS — H26492 Other secondary cataract, left eye: Secondary | ICD-10-CM | POA: Diagnosis not present

## 2020-05-09 ENCOUNTER — Other Ambulatory Visit (HOSPITAL_COMMUNITY): Payer: Self-pay | Admitting: Internal Medicine

## 2020-05-09 ENCOUNTER — Other Ambulatory Visit: Payer: Self-pay

## 2020-05-09 ENCOUNTER — Ambulatory Visit (HOSPITAL_COMMUNITY)
Admission: RE | Admit: 2020-05-09 | Discharge: 2020-05-09 | Disposition: A | Discharge status: Home / Self Care | Payer: Medicare Other | Source: Ambulatory Visit | Attending: Vascular Surgery | Admitting: Vascular Surgery

## 2020-05-09 DIAGNOSIS — M79604 Pain in right leg: Secondary | ICD-10-CM

## 2020-05-09 DIAGNOSIS — I10 Essential (primary) hypertension: Secondary | ICD-10-CM | POA: Diagnosis not present

## 2020-05-09 DIAGNOSIS — I8290 Acute embolism and thrombosis of unspecified vein: Secondary | ICD-10-CM | POA: Diagnosis not present

## 2020-05-09 DIAGNOSIS — E785 Hyperlipidemia, unspecified: Secondary | ICD-10-CM | POA: Diagnosis not present

## 2020-05-09 DIAGNOSIS — M1990 Unspecified osteoarthritis, unspecified site: Secondary | ICD-10-CM | POA: Diagnosis not present

## 2020-05-09 DIAGNOSIS — C50919 Malignant neoplasm of unspecified site of unspecified female breast: Secondary | ICD-10-CM | POA: Diagnosis not present

## 2020-05-09 NOTE — Progress Notes (Signed)
Right venous duplex completed.   June Leap, BS, RDMS, RVT  Called results to [facility] at Dr. Jacquiline Doe office.

## 2020-07-04 DIAGNOSIS — Z23 Encounter for immunization: Secondary | ICD-10-CM | POA: Diagnosis not present

## 2020-08-02 DIAGNOSIS — Z23 Encounter for immunization: Secondary | ICD-10-CM | POA: Diagnosis not present

## 2020-10-11 DIAGNOSIS — Z1231 Encounter for screening mammogram for malignant neoplasm of breast: Secondary | ICD-10-CM | POA: Diagnosis not present

## 2021-01-09 DIAGNOSIS — M1990 Unspecified osteoarthritis, unspecified site: Secondary | ICD-10-CM | POA: Diagnosis not present

## 2021-01-09 DIAGNOSIS — I10 Essential (primary) hypertension: Secondary | ICD-10-CM | POA: Diagnosis not present

## 2021-01-09 DIAGNOSIS — E785 Hyperlipidemia, unspecified: Secondary | ICD-10-CM | POA: Diagnosis not present

## 2021-03-18 DIAGNOSIS — Z961 Presence of intraocular lens: Secondary | ICD-10-CM | POA: Diagnosis not present

## 2021-03-18 DIAGNOSIS — H52203 Unspecified astigmatism, bilateral: Secondary | ICD-10-CM | POA: Diagnosis not present

## 2021-03-18 DIAGNOSIS — H353131 Nonexudative age-related macular degeneration, bilateral, early dry stage: Secondary | ICD-10-CM | POA: Diagnosis not present

## 2021-03-18 DIAGNOSIS — H04123 Dry eye syndrome of bilateral lacrimal glands: Secondary | ICD-10-CM | POA: Diagnosis not present

## 2021-03-30 DIAGNOSIS — Z20822 Contact with and (suspected) exposure to covid-19: Secondary | ICD-10-CM | POA: Diagnosis not present

## 2021-04-04 DIAGNOSIS — Z20822 Contact with and (suspected) exposure to covid-19: Secondary | ICD-10-CM | POA: Diagnosis not present

## 2021-04-29 DIAGNOSIS — Z20822 Contact with and (suspected) exposure to covid-19: Secondary | ICD-10-CM | POA: Diagnosis not present

## 2021-06-18 DIAGNOSIS — Z20822 Contact with and (suspected) exposure to covid-19: Secondary | ICD-10-CM | POA: Diagnosis not present

## 2021-07-09 DIAGNOSIS — E785 Hyperlipidemia, unspecified: Secondary | ICD-10-CM | POA: Diagnosis not present

## 2021-07-09 DIAGNOSIS — I10 Essential (primary) hypertension: Secondary | ICD-10-CM | POA: Diagnosis not present

## 2021-07-15 DIAGNOSIS — Z1339 Encounter for screening examination for other mental health and behavioral disorders: Secondary | ICD-10-CM | POA: Diagnosis not present

## 2021-07-15 DIAGNOSIS — M1990 Unspecified osteoarthritis, unspecified site: Secondary | ICD-10-CM | POA: Diagnosis not present

## 2021-07-15 DIAGNOSIS — Z853 Personal history of malignant neoplasm of breast: Secondary | ICD-10-CM | POA: Diagnosis not present

## 2021-07-15 DIAGNOSIS — Z1212 Encounter for screening for malignant neoplasm of rectum: Secondary | ICD-10-CM | POA: Diagnosis not present

## 2021-07-15 DIAGNOSIS — Z23 Encounter for immunization: Secondary | ICD-10-CM | POA: Diagnosis not present

## 2021-07-15 DIAGNOSIS — Z0000 Encounter for general adult medical examination without abnormal findings: Secondary | ICD-10-CM | POA: Diagnosis not present

## 2021-07-15 DIAGNOSIS — R42 Dizziness and giddiness: Secondary | ICD-10-CM | POA: Diagnosis not present

## 2021-07-15 DIAGNOSIS — Z1331 Encounter for screening for depression: Secondary | ICD-10-CM | POA: Diagnosis not present

## 2021-07-15 DIAGNOSIS — R82998 Other abnormal findings in urine: Secondary | ICD-10-CM | POA: Diagnosis not present

## 2021-07-15 DIAGNOSIS — I10 Essential (primary) hypertension: Secondary | ICD-10-CM | POA: Diagnosis not present

## 2021-07-15 DIAGNOSIS — E785 Hyperlipidemia, unspecified: Secondary | ICD-10-CM | POA: Diagnosis not present

## 2021-07-22 DIAGNOSIS — Z20822 Contact with and (suspected) exposure to covid-19: Secondary | ICD-10-CM | POA: Diagnosis not present

## 2021-08-27 DIAGNOSIS — L821 Other seborrheic keratosis: Secondary | ICD-10-CM | POA: Diagnosis not present

## 2021-08-27 DIAGNOSIS — C4441 Basal cell carcinoma of skin of scalp and neck: Secondary | ICD-10-CM | POA: Diagnosis not present

## 2021-08-27 DIAGNOSIS — L570 Actinic keratosis: Secondary | ICD-10-CM | POA: Diagnosis not present

## 2021-08-27 DIAGNOSIS — H61002 Unspecified perichondritis of left external ear: Secondary | ICD-10-CM | POA: Diagnosis not present

## 2021-08-27 DIAGNOSIS — D485 Neoplasm of uncertain behavior of skin: Secondary | ICD-10-CM | POA: Diagnosis not present

## 2021-08-27 DIAGNOSIS — C44319 Basal cell carcinoma of skin of other parts of face: Secondary | ICD-10-CM | POA: Diagnosis not present

## 2021-08-27 DIAGNOSIS — Z85828 Personal history of other malignant neoplasm of skin: Secondary | ICD-10-CM | POA: Diagnosis not present

## 2021-09-03 DIAGNOSIS — Z20822 Contact with and (suspected) exposure to covid-19: Secondary | ICD-10-CM | POA: Diagnosis not present

## 2021-09-18 DIAGNOSIS — Z85828 Personal history of other malignant neoplasm of skin: Secondary | ICD-10-CM | POA: Diagnosis not present

## 2021-09-18 DIAGNOSIS — C44212 Basal cell carcinoma of skin of right ear and external auricular canal: Secondary | ICD-10-CM | POA: Diagnosis not present

## 2021-10-09 DIAGNOSIS — Z20822 Contact with and (suspected) exposure to covid-19: Secondary | ICD-10-CM | POA: Diagnosis not present

## 2021-11-19 DIAGNOSIS — Z20822 Contact with and (suspected) exposure to covid-19: Secondary | ICD-10-CM | POA: Diagnosis not present

## 2021-12-10 DIAGNOSIS — Z20822 Contact with and (suspected) exposure to covid-19: Secondary | ICD-10-CM | POA: Diagnosis not present

## 2021-12-17 DIAGNOSIS — R262 Difficulty in walking, not elsewhere classified: Secondary | ICD-10-CM | POA: Diagnosis not present

## 2021-12-17 DIAGNOSIS — M25774 Osteophyte, right foot: Secondary | ICD-10-CM | POA: Diagnosis not present

## 2021-12-17 DIAGNOSIS — L97411 Non-pressure chronic ulcer of right heel and midfoot limited to breakdown of skin: Secondary | ICD-10-CM | POA: Diagnosis not present

## 2021-12-17 DIAGNOSIS — L851 Acquired keratosis [keratoderma] palmaris et plantaris: Secondary | ICD-10-CM | POA: Diagnosis not present

## 2022-01-04 DIAGNOSIS — Z20828 Contact with and (suspected) exposure to other viral communicable diseases: Secondary | ICD-10-CM | POA: Diagnosis not present

## 2022-01-11 DIAGNOSIS — Z20822 Contact with and (suspected) exposure to covid-19: Secondary | ICD-10-CM | POA: Diagnosis not present

## 2022-01-14 DIAGNOSIS — Z853 Personal history of malignant neoplasm of breast: Secondary | ICD-10-CM | POA: Diagnosis not present

## 2022-01-14 DIAGNOSIS — Z1231 Encounter for screening mammogram for malignant neoplasm of breast: Secondary | ICD-10-CM | POA: Diagnosis not present

## 2022-01-17 DIAGNOSIS — Z20822 Contact with and (suspected) exposure to covid-19: Secondary | ICD-10-CM | POA: Diagnosis not present

## 2022-01-21 DIAGNOSIS — Z20822 Contact with and (suspected) exposure to covid-19: Secondary | ICD-10-CM | POA: Diagnosis not present

## 2022-01-22 DIAGNOSIS — Z20822 Contact with and (suspected) exposure to covid-19: Secondary | ICD-10-CM | POA: Diagnosis not present

## 2022-01-28 DIAGNOSIS — Z20822 Contact with and (suspected) exposure to covid-19: Secondary | ICD-10-CM | POA: Diagnosis not present

## 2022-01-29 DIAGNOSIS — I10 Essential (primary) hypertension: Secondary | ICD-10-CM | POA: Diagnosis not present

## 2022-01-29 DIAGNOSIS — E785 Hyperlipidemia, unspecified: Secondary | ICD-10-CM | POA: Diagnosis not present

## 2022-01-29 DIAGNOSIS — M1990 Unspecified osteoarthritis, unspecified site: Secondary | ICD-10-CM | POA: Diagnosis not present

## 2022-01-31 DIAGNOSIS — Z20822 Contact with and (suspected) exposure to covid-19: Secondary | ICD-10-CM | POA: Diagnosis not present

## 2022-02-03 DIAGNOSIS — Z20822 Contact with and (suspected) exposure to covid-19: Secondary | ICD-10-CM | POA: Diagnosis not present

## 2022-03-24 DIAGNOSIS — H04123 Dry eye syndrome of bilateral lacrimal glands: Secondary | ICD-10-CM | POA: Diagnosis not present

## 2022-03-24 DIAGNOSIS — Z961 Presence of intraocular lens: Secondary | ICD-10-CM | POA: Diagnosis not present

## 2022-03-24 DIAGNOSIS — H524 Presbyopia: Secondary | ICD-10-CM | POA: Diagnosis not present

## 2022-03-26 DIAGNOSIS — M79672 Pain in left foot: Secondary | ICD-10-CM | POA: Diagnosis not present

## 2022-03-26 DIAGNOSIS — M79671 Pain in right foot: Secondary | ICD-10-CM | POA: Diagnosis not present

## 2022-03-26 DIAGNOSIS — M19072 Primary osteoarthritis, left ankle and foot: Secondary | ICD-10-CM | POA: Diagnosis not present

## 2022-03-26 DIAGNOSIS — L851 Acquired keratosis [keratoderma] palmaris et plantaris: Secondary | ICD-10-CM | POA: Diagnosis not present

## 2022-03-26 DIAGNOSIS — M792 Neuralgia and neuritis, unspecified: Secondary | ICD-10-CM | POA: Diagnosis not present

## 2022-03-26 DIAGNOSIS — M2042 Other hammer toe(s) (acquired), left foot: Secondary | ICD-10-CM | POA: Diagnosis not present

## 2022-03-26 DIAGNOSIS — M2041 Other hammer toe(s) (acquired), right foot: Secondary | ICD-10-CM | POA: Diagnosis not present

## 2022-03-26 DIAGNOSIS — M19071 Primary osteoarthritis, right ankle and foot: Secondary | ICD-10-CM | POA: Diagnosis not present

## 2022-03-26 DIAGNOSIS — R609 Edema, unspecified: Secondary | ICD-10-CM | POA: Diagnosis not present

## 2022-03-27 DIAGNOSIS — Z23 Encounter for immunization: Secondary | ICD-10-CM | POA: Diagnosis not present

## 2022-04-24 DIAGNOSIS — M792 Neuralgia and neuritis, unspecified: Secondary | ICD-10-CM | POA: Diagnosis not present

## 2022-04-24 DIAGNOSIS — L851 Acquired keratosis [keratoderma] palmaris et plantaris: Secondary | ICD-10-CM | POA: Diagnosis not present

## 2022-04-24 DIAGNOSIS — M19072 Primary osteoarthritis, left ankle and foot: Secondary | ICD-10-CM | POA: Diagnosis not present

## 2022-04-24 DIAGNOSIS — M2041 Other hammer toe(s) (acquired), right foot: Secondary | ICD-10-CM | POA: Diagnosis not present

## 2022-04-24 DIAGNOSIS — M2042 Other hammer toe(s) (acquired), left foot: Secondary | ICD-10-CM | POA: Diagnosis not present

## 2022-04-24 DIAGNOSIS — M19071 Primary osteoarthritis, right ankle and foot: Secondary | ICD-10-CM | POA: Diagnosis not present

## 2022-04-24 DIAGNOSIS — M79672 Pain in left foot: Secondary | ICD-10-CM | POA: Diagnosis not present

## 2022-04-24 DIAGNOSIS — M79671 Pain in right foot: Secondary | ICD-10-CM | POA: Diagnosis not present

## 2022-07-02 DIAGNOSIS — C44729 Squamous cell carcinoma of skin of left lower limb, including hip: Secondary | ICD-10-CM | POA: Diagnosis not present

## 2022-07-02 DIAGNOSIS — L821 Other seborrheic keratosis: Secondary | ICD-10-CM | POA: Diagnosis not present

## 2022-07-02 DIAGNOSIS — D485 Neoplasm of uncertain behavior of skin: Secondary | ICD-10-CM | POA: Diagnosis not present

## 2022-07-02 DIAGNOSIS — L570 Actinic keratosis: Secondary | ICD-10-CM | POA: Diagnosis not present

## 2022-07-02 DIAGNOSIS — Z85828 Personal history of other malignant neoplasm of skin: Secondary | ICD-10-CM | POA: Diagnosis not present

## 2022-07-02 DIAGNOSIS — D0461 Carcinoma in situ of skin of right upper limb, including shoulder: Secondary | ICD-10-CM | POA: Diagnosis not present

## 2022-07-14 DIAGNOSIS — I10 Essential (primary) hypertension: Secondary | ICD-10-CM | POA: Diagnosis not present

## 2022-07-14 DIAGNOSIS — R7989 Other specified abnormal findings of blood chemistry: Secondary | ICD-10-CM | POA: Diagnosis not present

## 2022-07-14 DIAGNOSIS — E785 Hyperlipidemia, unspecified: Secondary | ICD-10-CM | POA: Diagnosis not present

## 2022-07-16 DIAGNOSIS — R82998 Other abnormal findings in urine: Secondary | ICD-10-CM | POA: Diagnosis not present

## 2022-07-16 DIAGNOSIS — I10 Essential (primary) hypertension: Secondary | ICD-10-CM | POA: Diagnosis not present

## 2022-07-16 DIAGNOSIS — I8290 Acute embolism and thrombosis of unspecified vein: Secondary | ICD-10-CM | POA: Diagnosis not present

## 2022-07-16 DIAGNOSIS — N390 Urinary tract infection, site not specified: Secondary | ICD-10-CM | POA: Diagnosis not present

## 2022-07-16 DIAGNOSIS — Z23 Encounter for immunization: Secondary | ICD-10-CM | POA: Diagnosis not present

## 2022-07-16 DIAGNOSIS — Z0000 Encounter for general adult medical examination without abnormal findings: Secondary | ICD-10-CM | POA: Diagnosis not present

## 2022-07-16 DIAGNOSIS — E785 Hyperlipidemia, unspecified: Secondary | ICD-10-CM | POA: Diagnosis not present

## 2022-07-16 DIAGNOSIS — Z1331 Encounter for screening for depression: Secondary | ICD-10-CM | POA: Diagnosis not present

## 2022-07-16 DIAGNOSIS — Z1339 Encounter for screening examination for other mental health and behavioral disorders: Secondary | ICD-10-CM | POA: Diagnosis not present

## 2022-07-16 DIAGNOSIS — M545 Low back pain, unspecified: Secondary | ICD-10-CM | POA: Diagnosis not present

## 2022-07-16 DIAGNOSIS — G894 Chronic pain syndrome: Secondary | ICD-10-CM | POA: Diagnosis not present

## 2022-07-18 DIAGNOSIS — D649 Anemia, unspecified: Secondary | ICD-10-CM | POA: Diagnosis not present

## 2022-07-18 DIAGNOSIS — D599 Acquired hemolytic anemia, unspecified: Secondary | ICD-10-CM | POA: Diagnosis not present

## 2022-07-18 DIAGNOSIS — M069 Rheumatoid arthritis, unspecified: Secondary | ICD-10-CM | POA: Diagnosis not present

## 2022-07-18 DIAGNOSIS — D519 Vitamin B12 deficiency anemia, unspecified: Secondary | ICD-10-CM | POA: Diagnosis not present

## 2022-09-24 DIAGNOSIS — Z23 Encounter for immunization: Secondary | ICD-10-CM | POA: Diagnosis not present

## 2022-09-26 DIAGNOSIS — M79672 Pain in left foot: Secondary | ICD-10-CM | POA: Diagnosis not present

## 2022-09-26 DIAGNOSIS — M19072 Primary osteoarthritis, left ankle and foot: Secondary | ICD-10-CM | POA: Diagnosis not present

## 2022-09-26 DIAGNOSIS — M2041 Other hammer toe(s) (acquired), right foot: Secondary | ICD-10-CM | POA: Diagnosis not present

## 2022-09-26 DIAGNOSIS — M792 Neuralgia and neuritis, unspecified: Secondary | ICD-10-CM | POA: Diagnosis not present

## 2022-09-26 DIAGNOSIS — M19071 Primary osteoarthritis, right ankle and foot: Secondary | ICD-10-CM | POA: Diagnosis not present

## 2022-09-26 DIAGNOSIS — M79671 Pain in right foot: Secondary | ICD-10-CM | POA: Diagnosis not present

## 2022-09-26 DIAGNOSIS — L851 Acquired keratosis [keratoderma] palmaris et plantaris: Secondary | ICD-10-CM | POA: Diagnosis not present

## 2022-09-26 DIAGNOSIS — M6740 Ganglion, unspecified site: Secondary | ICD-10-CM | POA: Diagnosis not present

## 2022-09-26 DIAGNOSIS — M2042 Other hammer toe(s) (acquired), left foot: Secondary | ICD-10-CM | POA: Diagnosis not present

## 2022-10-01 DIAGNOSIS — Z85828 Personal history of other malignant neoplasm of skin: Secondary | ICD-10-CM | POA: Diagnosis not present

## 2022-10-01 DIAGNOSIS — L905 Scar conditions and fibrosis of skin: Secondary | ICD-10-CM | POA: Diagnosis not present

## 2022-11-14 DIAGNOSIS — K8689 Other specified diseases of pancreas: Secondary | ICD-10-CM | POA: Diagnosis not present

## 2022-11-14 DIAGNOSIS — I255 Ischemic cardiomyopathy: Secondary | ICD-10-CM | POA: Diagnosis not present

## 2022-11-14 DIAGNOSIS — J9601 Acute respiratory failure with hypoxia: Secondary | ICD-10-CM | POA: Diagnosis not present

## 2022-11-14 DIAGNOSIS — R519 Headache, unspecified: Secondary | ICD-10-CM | POA: Diagnosis not present

## 2022-11-14 DIAGNOSIS — M25521 Pain in right elbow: Secondary | ICD-10-CM | POA: Diagnosis not present

## 2022-11-14 DIAGNOSIS — I214 Non-ST elevation (NSTEMI) myocardial infarction: Secondary | ICD-10-CM | POA: Diagnosis not present

## 2022-11-14 DIAGNOSIS — S199XXA Unspecified injury of neck, initial encounter: Secondary | ICD-10-CM | POA: Diagnosis not present

## 2022-11-14 DIAGNOSIS — N281 Cyst of kidney, acquired: Secondary | ICD-10-CM | POA: Diagnosis not present

## 2022-11-14 DIAGNOSIS — R579 Shock, unspecified: Secondary | ICD-10-CM | POA: Diagnosis not present

## 2022-11-14 DIAGNOSIS — E875 Hyperkalemia: Secondary | ICD-10-CM | POA: Diagnosis not present

## 2022-11-14 DIAGNOSIS — R0989 Other specified symptoms and signs involving the circulatory and respiratory systems: Secondary | ICD-10-CM | POA: Diagnosis not present

## 2022-11-14 DIAGNOSIS — Z86718 Personal history of other venous thrombosis and embolism: Secondary | ICD-10-CM | POA: Diagnosis not present

## 2022-11-14 DIAGNOSIS — I081 Rheumatic disorders of both mitral and tricuspid valves: Secondary | ICD-10-CM | POA: Diagnosis not present

## 2022-11-14 DIAGNOSIS — I272 Pulmonary hypertension, unspecified: Secondary | ICD-10-CM | POA: Diagnosis not present

## 2022-11-14 DIAGNOSIS — I469 Cardiac arrest, cause unspecified: Secondary | ICD-10-CM | POA: Diagnosis not present

## 2022-11-14 DIAGNOSIS — R918 Other nonspecific abnormal finding of lung field: Secondary | ICD-10-CM | POA: Diagnosis not present

## 2022-11-14 DIAGNOSIS — R911 Solitary pulmonary nodule: Secondary | ICD-10-CM | POA: Diagnosis not present

## 2022-11-14 DIAGNOSIS — I110 Hypertensive heart disease with heart failure: Secondary | ICD-10-CM | POA: Diagnosis not present

## 2022-11-14 DIAGNOSIS — R0789 Other chest pain: Secondary | ICD-10-CM | POA: Diagnosis not present

## 2022-11-14 DIAGNOSIS — Z7902 Long term (current) use of antithrombotics/antiplatelets: Secondary | ICD-10-CM | POA: Diagnosis not present

## 2022-11-14 DIAGNOSIS — K5730 Diverticulosis of large intestine without perforation or abscess without bleeding: Secondary | ICD-10-CM | POA: Diagnosis not present

## 2022-11-14 DIAGNOSIS — R55 Syncope and collapse: Secondary | ICD-10-CM | POA: Diagnosis not present

## 2022-11-14 DIAGNOSIS — Z4682 Encounter for fitting and adjustment of non-vascular catheter: Secondary | ICD-10-CM | POA: Diagnosis not present

## 2022-11-14 DIAGNOSIS — Z955 Presence of coronary angioplasty implant and graft: Secondary | ICD-10-CM | POA: Diagnosis not present

## 2022-11-14 DIAGNOSIS — I462 Cardiac arrest due to underlying cardiac condition: Secondary | ICD-10-CM | POA: Diagnosis not present

## 2022-11-14 DIAGNOSIS — J811 Chronic pulmonary edema: Secondary | ICD-10-CM | POA: Diagnosis not present

## 2022-11-14 DIAGNOSIS — I340 Nonrheumatic mitral (valve) insufficiency: Secondary | ICD-10-CM | POA: Diagnosis not present

## 2022-11-14 DIAGNOSIS — Z9221 Personal history of antineoplastic chemotherapy: Secondary | ICD-10-CM | POA: Diagnosis not present

## 2022-11-14 DIAGNOSIS — I509 Heart failure, unspecified: Secondary | ICD-10-CM | POA: Diagnosis not present

## 2022-11-14 DIAGNOSIS — E876 Hypokalemia: Secondary | ICD-10-CM | POA: Diagnosis not present

## 2022-11-14 DIAGNOSIS — M96A3 Multiple fractures of ribs associated with chest compression and cardiopulmonary resuscitation: Secondary | ICD-10-CM | POA: Diagnosis not present

## 2022-11-14 DIAGNOSIS — I2510 Atherosclerotic heart disease of native coronary artery without angina pectoris: Secondary | ICD-10-CM | POA: Diagnosis not present

## 2022-11-14 DIAGNOSIS — J939 Pneumothorax, unspecified: Secondary | ICD-10-CM | POA: Diagnosis not present

## 2022-11-14 DIAGNOSIS — S3993XA Unspecified injury of pelvis, initial encounter: Secondary | ICD-10-CM | POA: Diagnosis not present

## 2022-11-14 DIAGNOSIS — I517 Cardiomegaly: Secondary | ICD-10-CM | POA: Diagnosis not present

## 2022-11-14 DIAGNOSIS — I480 Paroxysmal atrial fibrillation: Secondary | ICD-10-CM | POA: Diagnosis not present

## 2022-11-14 DIAGNOSIS — I442 Atrioventricular block, complete: Secondary | ICD-10-CM | POA: Diagnosis not present

## 2022-11-14 DIAGNOSIS — Z23 Encounter for immunization: Secondary | ICD-10-CM | POA: Diagnosis not present

## 2022-11-14 DIAGNOSIS — R0602 Shortness of breath: Secondary | ICD-10-CM | POA: Diagnosis not present

## 2022-11-14 DIAGNOSIS — R059 Cough, unspecified: Secondary | ICD-10-CM | POA: Diagnosis not present

## 2022-11-14 DIAGNOSIS — J90 Pleural effusion, not elsewhere classified: Secondary | ICD-10-CM | POA: Diagnosis not present

## 2022-11-14 DIAGNOSIS — I10 Essential (primary) hypertension: Secondary | ICD-10-CM | POA: Diagnosis not present

## 2022-11-14 DIAGNOSIS — D649 Anemia, unspecified: Secondary | ICD-10-CM | POA: Diagnosis not present

## 2022-11-14 DIAGNOSIS — I358 Other nonrheumatic aortic valve disorders: Secondary | ICD-10-CM | POA: Diagnosis not present

## 2022-11-14 DIAGNOSIS — Z86711 Personal history of pulmonary embolism: Secondary | ICD-10-CM | POA: Diagnosis not present

## 2022-11-14 DIAGNOSIS — Z452 Encounter for adjustment and management of vascular access device: Secondary | ICD-10-CM | POA: Diagnosis not present

## 2022-11-14 DIAGNOSIS — Z853 Personal history of malignant neoplasm of breast: Secondary | ICD-10-CM | POA: Diagnosis not present

## 2022-11-14 DIAGNOSIS — I2582 Chronic total occlusion of coronary artery: Secondary | ICD-10-CM | POA: Diagnosis not present

## 2022-11-14 DIAGNOSIS — I361 Nonrheumatic tricuspid (valve) insufficiency: Secondary | ICD-10-CM | POA: Diagnosis not present

## 2022-11-14 DIAGNOSIS — S270XXA Traumatic pneumothorax, initial encounter: Secondary | ICD-10-CM | POA: Diagnosis not present

## 2022-11-14 DIAGNOSIS — R937 Abnormal findings on diagnostic imaging of other parts of musculoskeletal system: Secondary | ICD-10-CM | POA: Diagnosis not present

## 2022-11-14 DIAGNOSIS — D72829 Elevated white blood cell count, unspecified: Secondary | ICD-10-CM | POA: Diagnosis not present

## 2022-11-14 DIAGNOSIS — N179 Acute kidney failure, unspecified: Secondary | ICD-10-CM | POA: Diagnosis not present

## 2022-11-14 DIAGNOSIS — I4901 Ventricular fibrillation: Secondary | ICD-10-CM | POA: Diagnosis not present

## 2022-11-15 DIAGNOSIS — S3993XA Unspecified injury of pelvis, initial encounter: Secondary | ICD-10-CM | POA: Diagnosis not present

## 2022-11-15 DIAGNOSIS — K8689 Other specified diseases of pancreas: Secondary | ICD-10-CM | POA: Diagnosis not present

## 2022-11-15 DIAGNOSIS — K5730 Diverticulosis of large intestine without perforation or abscess without bleeding: Secondary | ICD-10-CM | POA: Diagnosis not present

## 2022-11-15 DIAGNOSIS — N281 Cyst of kidney, acquired: Secondary | ICD-10-CM | POA: Diagnosis not present

## 2022-11-17 DIAGNOSIS — R0602 Shortness of breath: Secondary | ICD-10-CM | POA: Diagnosis not present

## 2022-11-17 DIAGNOSIS — J811 Chronic pulmonary edema: Secondary | ICD-10-CM | POA: Diagnosis not present

## 2022-11-17 DIAGNOSIS — I517 Cardiomegaly: Secondary | ICD-10-CM | POA: Diagnosis not present

## 2022-11-18 DIAGNOSIS — R0989 Other specified symptoms and signs involving the circulatory and respiratory systems: Secondary | ICD-10-CM | POA: Diagnosis not present

## 2022-11-18 DIAGNOSIS — R0602 Shortness of breath: Secondary | ICD-10-CM | POA: Diagnosis not present

## 2022-11-18 DIAGNOSIS — J90 Pleural effusion, not elsewhere classified: Secondary | ICD-10-CM | POA: Diagnosis not present

## 2022-11-24 DIAGNOSIS — R531 Weakness: Secondary | ICD-10-CM | POA: Diagnosis not present

## 2022-11-24 DIAGNOSIS — R600 Localized edema: Secondary | ICD-10-CM | POA: Diagnosis not present

## 2022-11-24 DIAGNOSIS — I469 Cardiac arrest, cause unspecified: Secondary | ICD-10-CM | POA: Diagnosis not present

## 2022-11-24 DIAGNOSIS — R0609 Other forms of dyspnea: Secondary | ICD-10-CM | POA: Diagnosis not present

## 2022-11-24 DIAGNOSIS — I4901 Ventricular fibrillation: Secondary | ICD-10-CM | POA: Diagnosis not present

## 2022-11-26 ENCOUNTER — Ambulatory Visit: Payer: Medicare Other | Admitting: Cardiovascular Disease

## 2022-11-27 ENCOUNTER — Encounter (HOSPITAL_COMMUNITY): Payer: Self-pay | Admitting: Cardiology

## 2022-11-27 ENCOUNTER — Ambulatory Visit (HOSPITAL_COMMUNITY)
Admission: RE | Admit: 2022-11-27 | Discharge: 2022-11-27 | Disposition: A | Discharge status: Home / Self Care | Payer: Medicare Other | Source: Ambulatory Visit | Attending: Cardiology | Admitting: Cardiology

## 2022-11-27 VITALS — BP 102/60 | HR 70 | Wt 147.6 lb

## 2022-11-27 DIAGNOSIS — I110 Hypertensive heart disease with heart failure: Secondary | ICD-10-CM | POA: Diagnosis not present

## 2022-11-27 DIAGNOSIS — R0602 Shortness of breath: Secondary | ICD-10-CM | POA: Diagnosis not present

## 2022-11-27 DIAGNOSIS — I2510 Atherosclerotic heart disease of native coronary artery without angina pectoris: Secondary | ICD-10-CM | POA: Insufficient documentation

## 2022-11-27 DIAGNOSIS — Z86718 Personal history of other venous thrombosis and embolism: Secondary | ICD-10-CM | POA: Diagnosis not present

## 2022-11-27 DIAGNOSIS — I5022 Chronic systolic (congestive) heart failure: Secondary | ICD-10-CM | POA: Diagnosis not present

## 2022-11-27 DIAGNOSIS — I480 Paroxysmal atrial fibrillation: Secondary | ICD-10-CM | POA: Insufficient documentation

## 2022-11-27 DIAGNOSIS — Z79899 Other long term (current) drug therapy: Secondary | ICD-10-CM | POA: Diagnosis not present

## 2022-11-27 DIAGNOSIS — Z7901 Long term (current) use of anticoagulants: Secondary | ICD-10-CM | POA: Insufficient documentation

## 2022-11-27 DIAGNOSIS — Z8674 Personal history of sudden cardiac arrest: Secondary | ICD-10-CM | POA: Insufficient documentation

## 2022-11-27 DIAGNOSIS — Z853 Personal history of malignant neoplasm of breast: Secondary | ICD-10-CM | POA: Insufficient documentation

## 2022-11-27 DIAGNOSIS — I255 Ischemic cardiomyopathy: Secondary | ICD-10-CM | POA: Insufficient documentation

## 2022-11-27 DIAGNOSIS — I214 Non-ST elevation (NSTEMI) myocardial infarction: Secondary | ICD-10-CM | POA: Insufficient documentation

## 2022-11-27 DIAGNOSIS — I447 Left bundle-branch block, unspecified: Secondary | ICD-10-CM | POA: Diagnosis not present

## 2022-11-27 DIAGNOSIS — R0600 Dyspnea, unspecified: Secondary | ICD-10-CM | POA: Diagnosis not present

## 2022-11-27 DIAGNOSIS — Z7902 Long term (current) use of antithrombotics/antiplatelets: Secondary | ICD-10-CM | POA: Insufficient documentation

## 2022-11-27 LAB — BASIC METABOLIC PANEL
Anion gap: 12 (ref 5–15)
BUN: 32 mg/dL — ABNORMAL HIGH (ref 8–23)
CO2: 30 mmol/L (ref 22–32)
Calcium: 10.6 mg/dL — ABNORMAL HIGH (ref 8.9–10.3)
Chloride: 95 mmol/L — ABNORMAL LOW (ref 98–111)
Creatinine, Ser: 1.29 mg/dL — ABNORMAL HIGH (ref 0.44–1.00)
GFR, Estimated: 39 mL/min — ABNORMAL LOW (ref >60)
Glucose, Bld: 117 mg/dL — ABNORMAL HIGH (ref 70–99)
Potassium: 3.8 mmol/L (ref 3.5–5.1)
Sodium: 137 mmol/L (ref 135–145)

## 2022-11-27 LAB — BRAIN NATRIURETIC PEPTIDE: B Natriuretic Peptide: 1569.7 pg/mL — ABNORMAL HIGH (ref 0.0–100.0)

## 2022-11-27 LAB — LIPID PANEL
Cholesterol: 124 mg/dL (ref 0–200)
HDL: 36 mg/dL — ABNORMAL LOW (ref >40)
LDL Cholesterol: 69 mg/dL (ref 0–99)
Total CHOL/HDL Ratio: 3.4 RATIO
Triglycerides: 94 mg/dL (ref <150)
VLDL: 19 mg/dL (ref 0–40)

## 2022-11-27 LAB — CBC
HCT: 31.7 % — ABNORMAL LOW (ref 36.0–46.0)
Hemoglobin: 10.3 g/dL — ABNORMAL LOW (ref 12.0–15.0)
MCH: 30.1 pg (ref 26.0–34.0)
MCHC: 32.5 g/dL (ref 30.0–36.0)
MCV: 92.7 fL (ref 80.0–100.0)
Platelets: 363 10*3/uL (ref 150–400)
RBC: 3.42 MIL/uL — ABNORMAL LOW (ref 3.87–5.11)
RDW: 14.9 % (ref 11.5–15.5)
WBC: 8.8 10*3/uL (ref 4.0–10.5)
nRBC: 0 % (ref 0.0–0.2)

## 2022-11-27 MED ORDER — VALSARTAN 40 MG PO TABS: 20.0000 mg | ORAL_TABLET | Freq: Every day | ORAL | 3 refills | Status: DC

## 2022-11-27 MED ORDER — DAPAGLIFLOZIN PROPANEDIOL 10 MG PO TABS: 10.0000 mg | ORAL_TABLET | Freq: Every day | ORAL | 11 refills | Status: DC

## 2022-11-27 MED ORDER — FUROSEMIDE 40 MG PO TABS: 60.0000 mg | ORAL_TABLET | Freq: Two times a day (BID) | ORAL | 3 refills | Status: DC

## 2022-11-27 NOTE — Progress Notes (Incomplete)
PCP: Burnard Bunting, MD Cardiology: Dr. Aundra Dubin  ECG (personally reviewed): NSR, LBBB-like IVCD 128 msec  PMH: 1. LBBB: Chronic 2. HTN 3. Breast cancer: Treated in 2008 with surgery and radiation.  She had Adriamycin.  4. Hyperlipidemia 5. DVT: 2019, in setting of long car ride.  6. Atrial fibrillation: Paroxysmal.  First noted during admission for MI in 2024.  7. CAD: NSTEMI 2/24, treated in [place], [place].  Cath showed severe stenosis in LCx, she had DES placed.  8. Chronic systolic CHF: Ischemic cardiomyopathy.  Echo from 2/24 in [place] per family's report showed a weak heart.  9. VF arrest: In 2/24 in the setting of MI.    SH: Widow, lives in [place]).  2 children.  No smoking.  No ETOH.   FH: Son with CABG.   ROS: All systems reviewed and negative except as per HPI.   Current Outpatient Medications  Medication Sig Dispense Refill  . atorvastatin (LIPITOR) 20 MG tablet Take 20 mg by mouth daily.    . carvedilol (COREG) 3.125 MG tablet Take 3.125 mg by mouth 2 (two) times daily with a meal.    . Cholecalciferol 50 MCG (2000 UT) TABS Take 1 tablet by mouth daily in the afternoon.    . clopidogrel (PLAVIX) 75 MG tablet Take 75 mg by mouth daily.    . dapagliflozin propanediol (FARXIGA) 10 MG TABS tablet Take 1 tablet (10 mg total) by mouth daily before breakfast. 30 tablet 11  . diazepam (VALIUM) 5 MG tablet As needed    . ELIQUIS 5 MG TABS tablet Take 5 mg by mouth 2 (two) times daily.    . Multiple Vitamins-Minerals (EYE VITAMINS PO) Take 1 tablet by mouth daily.     . Potassium Chloride ER 20 MEQ TBCR Take 1 tablet by mouth daily.    . valsartan (DIOVAN) 40 MG tablet Take 0.5 tablets (20 mg total) by mouth daily. 45 tablet 3  . furosemide (LASIX) 40 MG tablet Take 1.5 tablets (60 mg total) by mouth 2 (two) times daily. 90 tablet 3   No current facility-administered medications for this encounter.   BP 102/60   Pulse 70   Wt 67 kg (147 lb 9.6 oz)   SpO2 95%    BMI 24.56 kg/m  General: NAD Neck: No JVD, no thyromegaly or thyroid nodule.  Lungs: Clear to auscultation bilaterally with normal respiratory effort. CV: Nondisplaced PMI.  Heart regular S1/S2, no S3/S4, no murmur.  No peripheral edema.  No carotid bruit.  Normal pedal pulses.  Abdomen: Soft, nontender, no hepatosplenomegaly, no distention.  Skin: Intact without lesions or rashes.  Neurologic: Alert and oriented x 3.  Psych: Normal affect. Extremities: No clubbing or cyanosis.  HEENT: Normal.

## 2022-11-27 NOTE — Progress Notes (Signed)
PCP: Burnard Bunting, MD Cardiology: Dr. Aundra Dubin  89 y.o. with history of breast cancer, chronic LBBB, and HTN was recently admitted to a hospital in [place], [place] with NSTEMI and VF arrest.  She was referred by Dr. Reynaldo Minium for evaluation of CHF, atrial fibrillation, and acute systolic CHF.  Prior to MI in 2/24, patient was quite healthy.  She played tennis and pickleball. She had no exertional dyspnea or chest pain.  She had a DVT in 2019 after a long car trip.  She had also, of note, received Adriamycin years ago with treatment of breast cancer.   In 2/24, she developed severe "heartburn" one evening.  Meds for heartburn did not help.  Pain eventually eased off and she was able to sleep.  However, chest pain returned again the next day and she went to the hospital.  While checking into the ER, she had a VF arrest requiring defibrillation and CPR. She broke several ribs. She was taken for cath, this showed severe stenosis in LCx that was treated with DES.  She developed atrial fibrillation while in the hospital and Eliquis was started.  She also was noted to have CHF and received IV Lasix.  I do not have records yet from this hospitalization.  Her daughter has been working on obtaining the records. She was discharged with a Lifevest.  She was not sent home with Lasix.   After discharge, she came back to [place]. She developed worsening ankle edema and dyspnea and was started on Lasix 40 mg daily then increased to bid by her PCP.  She remains quite short of breath, dyspneic just walking around her house.  She has orthopnea and has been sleeping in an adjustable hospital bed.  No further chest pain.  No lightheadedness. No palpitations. She is doing PT at home.   ECG (personally reviewed): NSR, LBBB-like IVCD 128 msec  PMH: 1. LBBB: Chronic 2. HTN 3. Breast cancer: Treated in 2008 with surgery and radiation.  She had Adriamycin.  4. Hyperlipidemia 5. DVT: 2019, in setting of long car ride.  6. Atrial  fibrillation: Paroxysmal.  First noted during admission for MI in 2024.  7. CAD: NSTEMI 2/24, treated in [place], [place].  Cath showed severe stenosis in LCx, she had DES placed.  8. Chronic systolic CHF: Echo in Q000111Q with EF 50-55%.  Ischemic cardiomyopathy.  Echo from 2/24 in [place] per family's report showed a weak heart.  9. VF arrest: In 2/24 in the setting of MI.    SH: Widow, lives in [place]).  2 children.  No smoking.  No ETOH.   FH: Son with CABG.   ROS: All systems reviewed and negative except as per HPI.   Current Outpatient Medications  Medication Sig Dispense Refill   atorvastatin (LIPITOR) 20 MG tablet Take 20 mg by mouth daily.     carvedilol (COREG) 3.125 MG tablet Take 3.125 mg by mouth 2 (two) times daily with a meal.     Cholecalciferol 50 MCG (2000 UT) TABS Take 1 tablet by mouth daily in the afternoon.     clopidogrel (PLAVIX) 75 MG tablet Take 75 mg by mouth daily.     dapagliflozin propanediol (FARXIGA) 10 MG TABS tablet Take 1 tablet (10 mg total) by mouth daily before breakfast. 30 tablet 11   diazepam (VALIUM) 5 MG tablet As needed     ELIQUIS 5 MG TABS tablet Take 5 mg by mouth 2 (two) times daily.     Multiple Vitamins-Minerals (  EYE VITAMINS PO) Take 1 tablet by mouth daily.      Potassium Chloride ER 20 MEQ TBCR Take 1 tablet by mouth daily.     valsartan (DIOVAN) 40 MG tablet Take 0.5 tablets (20 mg total) by mouth daily. 45 tablet 3   furosemide (LASIX) 40 MG tablet Take 1.5 tablets (60 mg total) by mouth 2 (two) times daily. 90 tablet 3   No current facility-administered medications for this encounter.   BP 102/60   Pulse 70   Wt 67 kg (147 lb 9.6 oz)   SpO2 95%   BMI 24.56 kg/m  General: NAD Neck: JVP 10-12 cm, no thyromegaly or thyroid nodule.  Lungs: Clear to auscultation bilaterally with normal respiratory effort. CV: Nondisplaced PMI.  Heart regular S1/S2, no S3/S4, 2/6 HSM LLSB.  1+ edema 1/2 to knees bilaterally.  No carotid  bruit.  Normal pedal pulses.  Abdomen: Soft, nontender, no hepatosplenomegaly, no distention.  Skin: Intact without lesions or rashes.  Neurologic: Alert and oriented x 3.  Psych: Normal affect. Extremities: No clubbing or cyanosis.  HEENT: Normal.   Assessment/Plan: 1. CAD: NSTEMI in 2/24 with DES to LCx.  Complicated by VF arrest, acute systolic CHF, and atrial fibrillation.  No further chest pain.  ECG with LBBB-like IVCD.  - Continue Plavix 75 daily.  She is not on ASA given Eliquis use.  - Continue atorvastatin 20 mg daily, check lipids today.  - Will try to get cath report from outside hospital.  - Refer for cardiac rehab after she finishes home PT.  2. Atrial fibrillation: Paroxysmal.  First noted in 2/24 with MI.  She is in NSR today.  - Continue Eliquis. Check CBC.  - Continue Coreg.  3. Chronic systolic CHF: Ischemic cardiomyopathy. Echo in [place], [place] at time of NSTEMI apparently showed low EF per family's report.  NYHA class III symptoms, she is volume overloaded on exam.  - Add valsartan 20 mg daily, BMET/BNP today and again in 10 days.  - Add Farxiga 10 mg daily.  - Increase Lasix to 60 mg bid.  - I will arrange for an echo in our office.  - I will try to get echo report from outside hospital.  - She is wearing a Lifevest.  Given VF arrest with MI, this would be reasonable to continue for now.  I will repeat echo 3 months after MI.  With her age, we are in a gray area concerning ICD.  If her EF is < 35%, we will discuss pros and cons of ICD but if she is stable without arrhythmias, I would likely favor not placing ICD.  4. VF arrest: In setting of MI in [place] in 2/24. Given revascularization, ICD was not placed during her admission.  - Wearing Lifevest.  - As above, if 3 month echo shows EF > 35%, ICD is moot point.  If EF < 35%, will need to discuss but given age think I would avoid unless she has additional VT/VF events.   Followup APP in 1 week.   Loralie Champagne 11/28/2022

## 2022-11-27 NOTE — Patient Instructions (Signed)
START Valsartan '20mg'$  ( 1/2 Tab) daily.  START Farxiga 10 mg daily.  INCREASE Lasix to 60 mg Twice daily  Your physician has requested that you have an echocardiogram. Echocardiography is a painless test that uses sound waves to create images of your heart. It provides your doctor with information about the size and shape of your heart and how well your heart's chambers and valves are working. This procedure takes approximately one hour. There are no restrictions for this procedure. Please do NOT wear cologne, perfume, aftershave, or lotions (deodorant is allowed). Please arrive 15 minutes prior to your appointment time.  You have been referred to cardiac rehab. They will call you to arrange your appointment.   Your physician recommends that you schedule a follow-up appointment Tuesday March 5th   If you have any questions or concerns before your next appointment please send [place] a message through [place] or call our office at [phone].    TO LEAVE A MESSAGE FOR THE NURSE SELECT OPTION 2, PLEASE LEAVE A MESSAGE INCLUDING: YOUR NAME DATE OF BIRTH CALL BACK NUMBER REASON FOR CALL**this is important as we prioritize the call backs  YOU WILL RECEIVE A CALL BACK THE SAME DAY AS LONG AS YOU CALL BEFORE 4:00 PM  At the [facility], you and your health needs are our priority. As part of our continuing mission to provide you with exceptional heart care, we have created designated Provider Care Teams. These Care Teams include your primary Cardiologist (physician) and Advanced Practice Providers (APPs- Physician Assistants and Nurse Practitioners) who all work together to provide you with the care you need, when you need it.   You may see any of the following providers on your designated Care Team at your next follow up: Dr Glori Bickers Dr Loralie Champagne Dr. Roxana Hires, NP Lyda Jester, [place] [facility] [place], [place] Forestine Na, NP Audry Riles, PharmD   Please be sure to bring in all your medications bottles to every appointment.    Thank you for choosing [facility]

## 2022-11-28 DIAGNOSIS — E876 Hypokalemia: Secondary | ICD-10-CM | POA: Diagnosis not present

## 2022-11-28 DIAGNOSIS — S2231XD Fracture of one rib, right side, subsequent encounter for fracture with routine healing: Secondary | ICD-10-CM | POA: Diagnosis not present

## 2022-11-28 DIAGNOSIS — F419 Anxiety disorder, unspecified: Secondary | ICD-10-CM | POA: Diagnosis not present

## 2022-11-28 DIAGNOSIS — I10 Essential (primary) hypertension: Secondary | ICD-10-CM | POA: Diagnosis not present

## 2022-11-28 DIAGNOSIS — Z7902 Long term (current) use of antithrombotics/antiplatelets: Secondary | ICD-10-CM | POA: Diagnosis not present

## 2022-11-28 DIAGNOSIS — I4901 Ventricular fibrillation: Secondary | ICD-10-CM | POA: Diagnosis not present

## 2022-11-28 DIAGNOSIS — Z9181 History of falling: Secondary | ICD-10-CM | POA: Diagnosis not present

## 2022-11-28 DIAGNOSIS — E785 Hyperlipidemia, unspecified: Secondary | ICD-10-CM | POA: Diagnosis not present

## 2022-11-28 DIAGNOSIS — Z955 Presence of coronary angioplasty implant and graft: Secondary | ICD-10-CM | POA: Diagnosis not present

## 2022-11-28 DIAGNOSIS — M1990 Unspecified osteoarthritis, unspecified site: Secondary | ICD-10-CM | POA: Diagnosis not present

## 2022-11-28 DIAGNOSIS — Z7901 Long term (current) use of anticoagulants: Secondary | ICD-10-CM | POA: Diagnosis not present

## 2022-11-28 DIAGNOSIS — Z853 Personal history of malignant neoplasm of breast: Secondary | ICD-10-CM | POA: Diagnosis not present

## 2022-12-01 NOTE — Progress Notes (Incomplete)
PCP: Burnard Bunting, MD Cardiology: Dr. Aundra Dubin  89 y.o. with history of breast cancer, chronic LBBB, and HTN was recently admitted to a hospital in [place], [place] with NSTEMI and VF arrest.  She was referred by Dr. Reynaldo Minium for evaluation of CHF, atrial fibrillation, and acute systolic CHF.  Prior to MI in 2/24, patient was quite healthy.  She played tennis and pickleball. She had no exertional dyspnea or chest pain.  She had a DVT in 2019 after a long car trip.  She had also, of note, received Adriamycin years ago with treatment of breast cancer.   In 2/24, she developed severe "heartburn" one evening.  Meds for heartburn did not help.  Pain eventually eased off and she was able to sleep.  However, chest pain returned again the next day and she went to the hospital.  While checking into the ER, she had a VF arrest requiring defibrillation and CPR. She broke several ribs. She was taken for cath, this showed severe stenosis in LCx that was treated with DES.  She developed atrial fibrillation while in the hospital and Eliquis was started.  She also was noted to have CHF and received IV Lasix.  I do not have records yet from this hospitalization.  Her daughter has been working on obtaining the records. She was discharged with a Lifevest.  She was not sent home with Lasix.   After discharge, she came back to [place]. She developed worsening ankle edema and dyspnea and was started on Lasix 40 mg daily then increased to bid by her PCP.  She remains quite short of breath, dyspneic just walking around her house.  She has orthopnea and has been sleeping in an adjustable hospital bed.  No further chest pain.  No lightheadedness. No palpitations. She is doing PT at home.   ECG (personally reviewed): NSR, LBBB-like IVCD 128 msec  PMH: 1. LBBB: Chronic 2. HTN 3. Breast cancer: Treated in 2008 with surgery and radiation.  She had Adriamycin.  4. Hyperlipidemia 5. DVT: 2019, in setting of long car ride.  6. Atrial  fibrillation: Paroxysmal.  First noted during admission for MI in 2024.  7. CAD: NSTEMI 2/24, treated in [place], [place].  Cath showed severe stenosis in LCx, she had DES placed.  8. Chronic systolic CHF: Echo in Q000111Q with EF 50-55%.  Ischemic cardiomyopathy.  Echo from 2/24 in [place] per family's report showed a weak heart.  9. VF arrest: In 2/24 in the setting of MI.    SH: Widow, lives in [place]).  2 children.  No smoking.  No ETOH.   FH: Son with CABG.   ROS: All systems reviewed and negative except as per HPI.   Current Outpatient Medications  Medication Sig Dispense Refill   atorvastatin (LIPITOR) 20 MG tablet Take 20 mg by mouth daily.     carvedilol (COREG) 3.125 MG tablet Take 3.125 mg by mouth 2 (two) times daily with a meal.     Cholecalciferol 50 MCG (2000 UT) TABS Take 1 tablet by mouth daily in the afternoon.     clopidogrel (PLAVIX) 75 MG tablet Take 75 mg by mouth daily.     dapagliflozin propanediol (FARXIGA) 10 MG TABS tablet Take 1 tablet (10 mg total) by mouth daily before breakfast. 30 tablet 11   diazepam (VALIUM) 5 MG tablet As needed     ELIQUIS 5 MG TABS tablet Take 5 mg by mouth 2 (two) times daily.     furosemide (LASIX)  40 MG tablet Take 1.5 tablets (60 mg total) by mouth 2 (two) times daily. 90 tablet 3   Multiple Vitamins-Minerals (EYE VITAMINS PO) Take 1 tablet by mouth daily.      Potassium Chloride ER 20 MEQ TBCR Take 1 tablet by mouth daily.     valsartan (DIOVAN) 40 MG tablet Take 0.5 tablets (20 mg total) by mouth daily. 45 tablet 3   No current facility-administered medications for this visit.   There were no vitals taken for this visit. General: NAD Neck: JVP 10-12 cm, no thyromegaly or thyroid nodule.  Lungs: Clear to auscultation bilaterally with normal respiratory effort. CV: Nondisplaced PMI.  Heart regular S1/S2, no S3/S4, 2/6 HSM LLSB.  1+ edema 1/2 to knees bilaterally.  No carotid bruit.  Normal pedal pulses.  Abdomen: Soft,  nontender, no hepatosplenomegaly, no distention.  Skin: Intact without lesions or rashes.  Neurologic: Alert and oriented x 3.  Psych: Normal affect. Extremities: No clubbing or cyanosis.  HEENT: Normal.   Assessment/Plan: 1. CAD: NSTEMI in 2/24 with DES to LCx.  Complicated by VF arrest, acute systolic CHF, and atrial fibrillation.  No further chest pain.  ECG with LBBB-like IVCD.  - Continue Plavix 75 daily.  She is not on ASA given Eliquis use.  - Continue atorvastatin 20 mg daily, check lipids today.  - Will try to get cath report from outside hospital.  - Refer for cardiac rehab after she finishes home PT.  2. Atrial fibrillation: Paroxysmal.  First noted in 2/24 with MI.  She is in NSR today.  - Continue Eliquis. Check CBC.  - Continue Coreg.  3. Chronic systolic CHF: Ischemic cardiomyopathy. Echo in [place], [place] at time of NSTEMI apparently showed low EF per family's report.  NYHA class III symptoms, she is volume overloaded on exam.  - Add valsartan 20 mg daily, BMET/BNP today and again in 10 days.  - Add Farxiga 10 mg daily.  - Increase Lasix to 60 mg bid.  - I will arrange for an echo in our office.  - I will try to get echo report from outside hospital.  - She is wearing a Lifevest.  Given VF arrest with MI, this would be reasonable to continue for now.  I will repeat echo 3 months after MI.  With her age, we are in a gray area concerning ICD.  If her EF is < 35%, we will discuss pros and cons of ICD but if she is stable without arrhythmias, I would likely favor not placing ICD.  4. VF arrest: In setting of MI in [place] in 2/24. Given revascularization, ICD was not placed during her admission.  - Wearing Lifevest.  - As above, if 3 month echo shows EF > 35%, ICD is moot point.  If EF < 35%, will need to discuss but given age think I would avoid unless she has additional VT/VF events.   Followup APP in 1 week.   [place] 12/01/2022

## 2022-12-02 ENCOUNTER — Ambulatory Visit (HOSPITAL_BASED_OUTPATIENT_CLINIC_OR_DEPARTMENT_OTHER)
Admission: RE | Admit: 2022-12-02 | Discharge: 2022-12-02 | Disposition: A | Discharge status: Home / Self Care | Payer: Medicare Other | Source: Ambulatory Visit

## 2022-12-02 ENCOUNTER — Ambulatory Visit (HOSPITAL_COMMUNITY)
Admission: RE | Admit: 2022-12-02 | Discharge: 2022-12-02 | Disposition: A | Discharge status: Home / Self Care | Payer: Medicare Other | Source: Ambulatory Visit | Attending: Family Medicine | Admitting: Family Medicine

## 2022-12-02 ENCOUNTER — Encounter (HOSPITAL_COMMUNITY): Payer: Self-pay

## 2022-12-02 VITALS — BP 108/60 | HR 61 | Ht 64.0 in | Wt 137.8 lb

## 2022-12-02 DIAGNOSIS — I5023 Acute on chronic systolic (congestive) heart failure: Secondary | ICD-10-CM | POA: Insufficient documentation

## 2022-12-02 DIAGNOSIS — Z86718 Personal history of other venous thrombosis and embolism: Secondary | ICD-10-CM | POA: Insufficient documentation

## 2022-12-02 DIAGNOSIS — I255 Ischemic cardiomyopathy: Secondary | ICD-10-CM | POA: Diagnosis not present

## 2022-12-02 DIAGNOSIS — Z79899 Other long term (current) drug therapy: Secondary | ICD-10-CM | POA: Insufficient documentation

## 2022-12-02 DIAGNOSIS — I5022 Chronic systolic (congestive) heart failure: Secondary | ICD-10-CM

## 2022-12-02 DIAGNOSIS — I252 Old myocardial infarction: Secondary | ICD-10-CM | POA: Diagnosis not present

## 2022-12-02 DIAGNOSIS — I110 Hypertensive heart disease with heart failure: Secondary | ICD-10-CM | POA: Insufficient documentation

## 2022-12-02 DIAGNOSIS — I4901 Ventricular fibrillation: Secondary | ICD-10-CM | POA: Diagnosis not present

## 2022-12-02 DIAGNOSIS — Z853 Personal history of malignant neoplasm of breast: Secondary | ICD-10-CM | POA: Diagnosis not present

## 2022-12-02 DIAGNOSIS — Z7902 Long term (current) use of antithrombotics/antiplatelets: Secondary | ICD-10-CM | POA: Diagnosis not present

## 2022-12-02 DIAGNOSIS — Z7901 Long term (current) use of anticoagulants: Secondary | ICD-10-CM | POA: Diagnosis not present

## 2022-12-02 DIAGNOSIS — I480 Paroxysmal atrial fibrillation: Secondary | ICD-10-CM | POA: Diagnosis not present

## 2022-12-02 DIAGNOSIS — I2510 Atherosclerotic heart disease of native coronary artery without angina pectoris: Secondary | ICD-10-CM | POA: Diagnosis not present

## 2022-12-02 LAB — BASIC METABOLIC PANEL
Anion gap: 10 (ref 5–15)
BUN: 26 mg/dL — ABNORMAL HIGH (ref 8–23)
CO2: 30 mmol/L (ref 22–32)
Calcium: 10.5 mg/dL — ABNORMAL HIGH (ref 8.9–10.3)
Chloride: 98 mmol/L (ref 98–111)
Creatinine, Ser: 1.24 mg/dL — ABNORMAL HIGH (ref 0.44–1.00)
GFR, Estimated: 41 mL/min — ABNORMAL LOW (ref >60)
Glucose, Bld: 114 mg/dL — ABNORMAL HIGH (ref 70–99)
Potassium: 3.5 mmol/L (ref 3.5–5.1)
Sodium: 138 mmol/L (ref 135–145)

## 2022-12-02 LAB — ECHOCARDIOGRAM COMPLETE
Area-P 1/2: 4.21 cm2
Calc EF: 29.3 %
S' Lateral: 4.5 cm
Single Plane A2C EF: 26.8 %
Single Plane A4C EF: 28.7 %

## 2022-12-02 LAB — BRAIN NATRIURETIC PEPTIDE: B Natriuretic Peptide: 1332.5 pg/mL — ABNORMAL HIGH (ref 0.0–100.0)

## 2022-12-02 MED ORDER — ATORVASTATIN CALCIUM 40 MG PO TABS: 40.0000 mg | ORAL_TABLET | Freq: Every day | ORAL | 3 refills | Status: DC

## 2022-12-02 NOTE — Patient Instructions (Signed)
Thank you for coming in today  Labs were done today, if any labs are abnormal the clinic will call you No news is good news  Medications: TAKE afternoon dose of Lasix no later than 3 p.m.   Follow up appointments:  Your physician recommends that you schedule a follow-up appointment in:  2 months with echocardiogram with Dr. Aundra Dubin  Your physician has requested that you have an echocardiogram. Echocardiography is a painless test that uses sound waves to create images of your heart. It provides your doctor with information about the size and shape of your heart and how well your heart's chambers and valves are working. This procedure takes approximately one hour. There are no restrictions for this procedure.       Do the following things EVERYDAY: Weigh yourself in the morning before breakfast. Write it down and keep it in a log. Take your medicines as prescribed Eat low salt foods--Limit salt (sodium) to 2000 mg per day.  Stay as active as you can everyday Limit all fluids for the day to less than 2 liters   At the [facility], you and your health needs are our priority. As part of our continuing mission to provide you with exceptional heart care, we have created designated Provider Care Teams. These Care Teams include your primary Cardiologist (physician) and Advanced Practice Providers (APPs- Physician Assistants and Nurse Practitioners) who all work together to provide you with the care you need, when you need it.   You may see any of the following providers on your designated Care Team at your next follow up: Dr Glori Bickers Dr Loralie Champagne Dr. Roxana Hires, NP Lyda Jester, [place] [facility] [place], [place] Forestine Na, NP Audry Riles, PharmD   Please be sure to bring in all your medications bottles to every appointment.    Thank you for choosing [facility]  If you have any  questions or concerns before your next appointment please send [place] a message through [place] or call our office at [phone].    TO LEAVE A MESSAGE FOR THE NURSE SELECT OPTION 2, PLEASE LEAVE A MESSAGE INCLUDING: YOUR NAME DATE OF BIRTH CALL BACK NUMBER REASON FOR CALL**this is important as we prioritize the call backs  YOU WILL RECEIVE A CALL BACK THE SAME DAY AS LONG AS YOU CALL BEFORE 4:00 PM

## 2022-12-03 DIAGNOSIS — E876 Hypokalemia: Secondary | ICD-10-CM | POA: Diagnosis not present

## 2022-12-03 DIAGNOSIS — S2231XD Fracture of one rib, right side, subsequent encounter for fracture with routine healing: Secondary | ICD-10-CM | POA: Diagnosis not present

## 2022-12-03 DIAGNOSIS — I10 Essential (primary) hypertension: Secondary | ICD-10-CM | POA: Diagnosis not present

## 2022-12-03 DIAGNOSIS — E785 Hyperlipidemia, unspecified: Secondary | ICD-10-CM | POA: Diagnosis not present

## 2022-12-03 DIAGNOSIS — F419 Anxiety disorder, unspecified: Secondary | ICD-10-CM | POA: Diagnosis not present

## 2022-12-03 DIAGNOSIS — I4901 Ventricular fibrillation: Secondary | ICD-10-CM | POA: Diagnosis not present

## 2022-12-04 DIAGNOSIS — F419 Anxiety disorder, unspecified: Secondary | ICD-10-CM | POA: Diagnosis not present

## 2022-12-04 DIAGNOSIS — S2231XD Fracture of one rib, right side, subsequent encounter for fracture with routine healing: Secondary | ICD-10-CM | POA: Diagnosis not present

## 2022-12-04 DIAGNOSIS — E876 Hypokalemia: Secondary | ICD-10-CM | POA: Diagnosis not present

## 2022-12-04 DIAGNOSIS — I10 Essential (primary) hypertension: Secondary | ICD-10-CM | POA: Diagnosis not present

## 2022-12-04 DIAGNOSIS — I4901 Ventricular fibrillation: Secondary | ICD-10-CM | POA: Diagnosis not present

## 2022-12-04 DIAGNOSIS — E785 Hyperlipidemia, unspecified: Secondary | ICD-10-CM | POA: Diagnosis not present

## 2022-12-09 ENCOUNTER — Telehealth (HOSPITAL_COMMUNITY): Payer: Self-pay | Admitting: *Deleted

## 2022-12-09 DIAGNOSIS — I5022 Chronic systolic (congestive) heart failure: Secondary | ICD-10-CM

## 2022-12-09 DIAGNOSIS — Z79899 Other long term (current) drug therapy: Secondary | ICD-10-CM

## 2022-12-09 MED ORDER — SPIRONOLACTONE 25 MG PO TABS: 12.5000 mg | ORAL_TABLET | Freq: Every day | ORAL | 3 refills | Status: DC

## 2022-12-09 NOTE — Telephone Encounter (Signed)
Called patient per [facility] with following lab results and instructions:   "Labs stable, BNP remains elevated suggesting fluid.  Start spiro 12.5 mg daily and stop KCL. This medication will help support heart function and help with fluid.  Repeat BMET in 7-10 days"   Pt verbalized understanding of above. If any questions or concerns she will call [facility] at [phone] option 2.

## 2022-12-11 DIAGNOSIS — I10 Essential (primary) hypertension: Secondary | ICD-10-CM | POA: Diagnosis not present

## 2022-12-11 DIAGNOSIS — S2231XD Fracture of one rib, right side, subsequent encounter for fracture with routine healing: Secondary | ICD-10-CM | POA: Diagnosis not present

## 2022-12-11 DIAGNOSIS — E785 Hyperlipidemia, unspecified: Secondary | ICD-10-CM | POA: Diagnosis not present

## 2022-12-11 DIAGNOSIS — E876 Hypokalemia: Secondary | ICD-10-CM | POA: Diagnosis not present

## 2022-12-11 DIAGNOSIS — F419 Anxiety disorder, unspecified: Secondary | ICD-10-CM | POA: Diagnosis not present

## 2022-12-11 DIAGNOSIS — I4901 Ventricular fibrillation: Secondary | ICD-10-CM | POA: Diagnosis not present

## 2022-12-16 ENCOUNTER — Other Ambulatory Visit (HOSPITAL_COMMUNITY): Payer: Self-pay | Admitting: Cardiology

## 2022-12-16 DIAGNOSIS — F419 Anxiety disorder, unspecified: Secondary | ICD-10-CM | POA: Diagnosis not present

## 2022-12-16 DIAGNOSIS — I10 Essential (primary) hypertension: Secondary | ICD-10-CM | POA: Diagnosis not present

## 2022-12-16 DIAGNOSIS — E785 Hyperlipidemia, unspecified: Secondary | ICD-10-CM | POA: Diagnosis not present

## 2022-12-16 DIAGNOSIS — I4901 Ventricular fibrillation: Secondary | ICD-10-CM | POA: Diagnosis not present

## 2022-12-16 DIAGNOSIS — S2231XD Fracture of one rib, right side, subsequent encounter for fracture with routine healing: Secondary | ICD-10-CM | POA: Diagnosis not present

## 2022-12-16 DIAGNOSIS — E876 Hypokalemia: Secondary | ICD-10-CM | POA: Diagnosis not present

## 2022-12-16 MED ORDER — CARVEDILOL 3.125 MG PO TABS: 3.1250 mg | ORAL_TABLET | Freq: Two times a day (BID) | ORAL | 11 refills | Status: DC

## 2022-12-16 MED ORDER — CLOPIDOGREL BISULFATE 75 MG PO TABS: 75.0000 mg | ORAL_TABLET | Freq: Every day | ORAL | 11 refills | Status: DC

## 2022-12-16 MED ORDER — APIXABAN 5 MG PO TABS: 5.0000 mg | ORAL_TABLET | Freq: Two times a day (BID) | ORAL | 11 refills | Status: DC

## 2022-12-16 MED ORDER — FUROSEMIDE 40 MG PO TABS: 60.0000 mg | ORAL_TABLET | Freq: Two times a day (BID) | ORAL | 3 refills | Status: DC

## 2022-12-22 ENCOUNTER — Ambulatory Visit (HOSPITAL_COMMUNITY)
Admission: RE | Admit: 2022-12-22 | Discharge: 2022-12-22 | Disposition: A | Discharge status: Home / Self Care | Payer: Medicare Other | Source: Ambulatory Visit | Attending: Internal Medicine | Admitting: Internal Medicine

## 2022-12-22 DIAGNOSIS — I5022 Chronic systolic (congestive) heart failure: Secondary | ICD-10-CM | POA: Insufficient documentation

## 2022-12-22 DIAGNOSIS — Z79899 Other long term (current) drug therapy: Secondary | ICD-10-CM | POA: Diagnosis not present

## 2022-12-22 LAB — BASIC METABOLIC PANEL
Anion gap: 9 (ref 5–15)
BUN: 30 mg/dL — ABNORMAL HIGH (ref 8–23)
CO2: 30 mmol/L (ref 22–32)
Calcium: 10.9 mg/dL — ABNORMAL HIGH (ref 8.9–10.3)
Chloride: 97 mmol/L — ABNORMAL LOW (ref 98–111)
Creatinine, Ser: 1.16 mg/dL — ABNORMAL HIGH (ref 0.44–1.00)
GFR, Estimated: 44 mL/min — ABNORMAL LOW (ref >60)
Glucose, Bld: 122 mg/dL — ABNORMAL HIGH (ref 70–99)
Potassium: 3.6 mmol/L (ref 3.5–5.1)
Sodium: 136 mmol/L (ref 135–145)

## 2022-12-22 LAB — BRAIN NATRIURETIC PEPTIDE: B Natriuretic Peptide: 610.4 pg/mL — ABNORMAL HIGH (ref 0.0–100.0)

## 2022-12-28 DIAGNOSIS — Z7901 Long term (current) use of anticoagulants: Secondary | ICD-10-CM | POA: Diagnosis not present

## 2022-12-28 DIAGNOSIS — Z7902 Long term (current) use of antithrombotics/antiplatelets: Secondary | ICD-10-CM | POA: Diagnosis not present

## 2022-12-28 DIAGNOSIS — Z9181 History of falling: Secondary | ICD-10-CM | POA: Diagnosis not present

## 2022-12-28 DIAGNOSIS — Z955 Presence of coronary angioplasty implant and graft: Secondary | ICD-10-CM | POA: Diagnosis not present

## 2022-12-28 DIAGNOSIS — I10 Essential (primary) hypertension: Secondary | ICD-10-CM | POA: Diagnosis not present

## 2022-12-28 DIAGNOSIS — Z853 Personal history of malignant neoplasm of breast: Secondary | ICD-10-CM | POA: Diagnosis not present

## 2022-12-28 DIAGNOSIS — M1990 Unspecified osteoarthritis, unspecified site: Secondary | ICD-10-CM | POA: Diagnosis not present

## 2022-12-28 DIAGNOSIS — F419 Anxiety disorder, unspecified: Secondary | ICD-10-CM | POA: Diagnosis not present

## 2022-12-28 DIAGNOSIS — E876 Hypokalemia: Secondary | ICD-10-CM | POA: Diagnosis not present

## 2022-12-28 DIAGNOSIS — I4901 Ventricular fibrillation: Secondary | ICD-10-CM | POA: Diagnosis not present

## 2022-12-28 DIAGNOSIS — S2231XD Fracture of one rib, right side, subsequent encounter for fracture with routine healing: Secondary | ICD-10-CM | POA: Diagnosis not present

## 2022-12-28 DIAGNOSIS — E785 Hyperlipidemia, unspecified: Secondary | ICD-10-CM | POA: Diagnosis not present

## 2022-12-30 DIAGNOSIS — S2231XD Fracture of one rib, right side, subsequent encounter for fracture with routine healing: Secondary | ICD-10-CM | POA: Diagnosis not present

## 2022-12-30 DIAGNOSIS — E785 Hyperlipidemia, unspecified: Secondary | ICD-10-CM | POA: Diagnosis not present

## 2022-12-30 DIAGNOSIS — F419 Anxiety disorder, unspecified: Secondary | ICD-10-CM | POA: Diagnosis not present

## 2022-12-30 DIAGNOSIS — I10 Essential (primary) hypertension: Secondary | ICD-10-CM | POA: Diagnosis not present

## 2022-12-30 DIAGNOSIS — I4901 Ventricular fibrillation: Secondary | ICD-10-CM | POA: Diagnosis not present

## 2022-12-30 DIAGNOSIS — E876 Hypokalemia: Secondary | ICD-10-CM | POA: Diagnosis not present

## 2023-01-19 DIAGNOSIS — I10 Essential (primary) hypertension: Secondary | ICD-10-CM | POA: Diagnosis not present

## 2023-01-19 DIAGNOSIS — I2089 Other forms of angina pectoris: Secondary | ICD-10-CM | POA: Diagnosis not present

## 2023-01-19 DIAGNOSIS — I4901 Ventricular fibrillation: Secondary | ICD-10-CM | POA: Diagnosis not present

## 2023-01-26 DIAGNOSIS — I4901 Ventricular fibrillation: Secondary | ICD-10-CM | POA: Diagnosis not present

## 2023-01-26 DIAGNOSIS — E876 Hypokalemia: Secondary | ICD-10-CM | POA: Diagnosis not present

## 2023-01-26 DIAGNOSIS — I10 Essential (primary) hypertension: Secondary | ICD-10-CM | POA: Diagnosis not present

## 2023-01-26 DIAGNOSIS — S2231XD Fracture of one rib, right side, subsequent encounter for fracture with routine healing: Secondary | ICD-10-CM | POA: Diagnosis not present

## 2023-01-26 DIAGNOSIS — F419 Anxiety disorder, unspecified: Secondary | ICD-10-CM | POA: Diagnosis not present

## 2023-01-26 DIAGNOSIS — E785 Hyperlipidemia, unspecified: Secondary | ICD-10-CM | POA: Diagnosis not present

## 2023-01-27 DIAGNOSIS — F419 Anxiety disorder, unspecified: Secondary | ICD-10-CM | POA: Diagnosis not present

## 2023-01-27 DIAGNOSIS — E785 Hyperlipidemia, unspecified: Secondary | ICD-10-CM | POA: Diagnosis not present

## 2023-01-27 DIAGNOSIS — Z853 Personal history of malignant neoplasm of breast: Secondary | ICD-10-CM | POA: Diagnosis not present

## 2023-01-27 DIAGNOSIS — S2231XD Fracture of one rib, right side, subsequent encounter for fracture with routine healing: Secondary | ICD-10-CM | POA: Diagnosis not present

## 2023-01-27 DIAGNOSIS — E876 Hypokalemia: Secondary | ICD-10-CM | POA: Diagnosis not present

## 2023-01-27 DIAGNOSIS — M1990 Unspecified osteoarthritis, unspecified site: Secondary | ICD-10-CM | POA: Diagnosis not present

## 2023-01-27 DIAGNOSIS — I4901 Ventricular fibrillation: Secondary | ICD-10-CM | POA: Diagnosis not present

## 2023-01-27 DIAGNOSIS — Z9181 History of falling: Secondary | ICD-10-CM | POA: Diagnosis not present

## 2023-01-27 DIAGNOSIS — I10 Essential (primary) hypertension: Secondary | ICD-10-CM | POA: Diagnosis not present

## 2023-01-27 DIAGNOSIS — Z7902 Long term (current) use of antithrombotics/antiplatelets: Secondary | ICD-10-CM | POA: Diagnosis not present

## 2023-01-27 DIAGNOSIS — Z7901 Long term (current) use of anticoagulants: Secondary | ICD-10-CM | POA: Diagnosis not present

## 2023-01-27 DIAGNOSIS — Z955 Presence of coronary angioplasty implant and graft: Secondary | ICD-10-CM | POA: Diagnosis not present

## 2023-01-28 ENCOUNTER — Ambulatory Visit: Payer: Medicare Other | Attending: Cardiovascular Disease | Admitting: Cardiovascular Disease

## 2023-01-28 ENCOUNTER — Encounter: Payer: Self-pay | Admitting: Cardiovascular Disease

## 2023-01-28 DIAGNOSIS — E785 Hyperlipidemia, unspecified: Secondary | ICD-10-CM

## 2023-01-28 DIAGNOSIS — I255 Ischemic cardiomyopathy: Secondary | ICD-10-CM

## 2023-01-28 DIAGNOSIS — I4901 Ventricular fibrillation: Secondary | ICD-10-CM

## 2023-01-28 DIAGNOSIS — I10 Essential (primary) hypertension: Secondary | ICD-10-CM | POA: Insufficient documentation

## 2023-01-28 DIAGNOSIS — D6869 Other thrombophilia: Secondary | ICD-10-CM | POA: Insufficient documentation

## 2023-01-28 DIAGNOSIS — R0602 Shortness of breath: Secondary | ICD-10-CM

## 2023-01-28 DIAGNOSIS — I2510 Atherosclerotic heart disease of native coronary artery without angina pectoris: Secondary | ICD-10-CM

## 2023-01-28 DIAGNOSIS — I249 Acute ischemic heart disease, unspecified: Secondary | ICD-10-CM | POA: Diagnosis not present

## 2023-01-28 DIAGNOSIS — I480 Paroxysmal atrial fibrillation: Secondary | ICD-10-CM | POA: Diagnosis not present

## 2023-01-28 DIAGNOSIS — Z853 Personal history of malignant neoplasm of breast: Secondary | ICD-10-CM | POA: Insufficient documentation

## 2023-01-28 DIAGNOSIS — I5022 Chronic systolic (congestive) heart failure: Secondary | ICD-10-CM

## 2023-01-28 NOTE — Patient Instructions (Signed)
Medication Instructions:  Take Lasix 1&1/2 tablets ( 60 mg ) in am and take 1 tablet ( 40 mg ) in pm Continue all other medications *If you need a refill on your cardiac medications before your next appointment, please call your pharmacy*   Lab Work: Cmet,cbc,bnp have done Friday 5/3 at Dr.Kelly's office [facility]  Lab order enclosed   Testing/Procedures: None ordered   Follow-Up: At [facility], you and your health needs are our priority.  As part of our continuing mission to provide you with exceptional heart care, we have created designated Provider Care Teams.  These Care Teams include your primary Cardiologist (physician) and Advanced Practice Providers (APPs -  Physician Assistants and Nurse Practitioners) who all work together to provide you with the care you need, when you need it.  We recommend signing up for the patient portal called "MyChart".  Sign up information is provided on this After Visit Summary.  MyChart is used to connect with patients for Virtual Visits (Telemedicine).  Patients are able to view lab/test results, encounter notes, upcoming appointments, etc.  Non-urgent messages can be sent to your provider as well.   To learn more about what you can do with MyChart, go to [email or web address].    Your next appointment:  3 to 4 months    Provider:  [facility]

## 2023-01-28 NOTE — Progress Notes (Addendum)
Cardiology Office Note    Date:  01/31/2023   ID:  Marissa Carroll, DOB 1928-02-19, MRN [id number]  PCP:  Geoffry Paradise, MD  Cardiologist:  Nicki Guadalajara, MD  Advanced HF: Dr. Shirlee Latch  No chief complaint on file.   History of Present Illness:  Marissa Carroll is a 89 y.o. female who presents for her initial evaluation with me.  She is a mother of Marissa Carroll.  I had referred her to Dr. Freida Busman after she had returned from [place] following a recent STEMI/VF cardiac arrest.  She presents for her initial evaluation with me.  Marissa Carroll is a very active 89 year old female who typically spends the winter months in [place].  She had remained active playing both tennis and pickleball and denied any exertional dyspnea or chest pain.  Remotely, she had developed a prior DVT in 2019 following a long car trip.  She also has remote history of breast CVA for which she had received Adriamycin years ago.  On November 13, 2022 she developed new onset "heartburn"  in the  evening.  The following day on November 14, 2022 her chest discomfort returned and when she was checking into an urgent care she had a VF cardiac arrest and fortunately underwent successful CPR and defibrillation.  She was hospitalized and underwent urgent catheterization and stenting of a severely stenosed left circumflex vessel.  Postoperatively she developed A-fib and therapy with Eliquis was initiated.  She had reduced LV function, and with CHF symptoms was treated with IV Lasix.  When initially seen by Dr. Lucien Mons, she had near card association class III symptoms and was volume overloaded.  She was started on Farxiga 10 mg in addition to valsartan 20 mg daily.  She continues to wear a LifeVest.  She underwent a follow-up echo Doppler study on December 02, 2022.  EF was 30 to 35% with moderate mitral regurgitation.  At a follow-up office visit to advanced heart failure clinic on December 02, 2022, it was recommended that she continue  Lasix 60 mg twice a day in addition to [place] 10 mg, carvedilol 3.125 mg twice a day and valsartan 20 mg daily.  Subsequent laboratory has shown improvement BNP which was 1569 on November 27, 2022, 1332 on December 02, 2022, and most recently on December 22, 2022 improved but still remain elevated at 610.  She is here with her daughter today for her establishment of care with me in addition to the advanced heart failure clinic.  Presently, Marissa Carroll continues to wear her LifeVest.  She is unaware of recent shortness of breath.  She denies leg swelling.  She is unaware of palpitations.  She is tentatively scheduled to see Dr. Shirlee Latch on Feb 03, 2023 and is scheduled to undergo an echo Doppler study the morning of that office visit.  She presents for evaluation.   Past Medical History:  Diagnosis Date   History of breast cancer    Hypertension     Past Surgical History:  Procedure Laterality Date   BREAST SURGERY  2009   lumpectomy   CARPAL TUNNEL RELEASE     KNEE ARTHROSCOPY  10/10   right    Current Medications: Outpatient Medications Prior to Visit  Medication Sig Dispense Refill   apixaban (ELIQUIS) 5 MG TABS tablet Take 1 tablet (5 mg total) by mouth 2 (two) times daily. 60 tablet 11   atorvastatin (LIPITOR) 40 MG tablet Take 1 tablet (40 mg total)  by mouth daily. 90 tablet 3   carvedilol (COREG) 3.125 MG tablet Take 1 tablet (3.125 mg total) by mouth 2 (two) times daily with a meal. 60 tablet 11   clopidogrel (PLAVIX) 75 MG tablet Take 1 tablet (75 mg total) by mouth daily. 30 tablet 11   dapagliflozin propanediol (FARXIGA) 10 MG TABS tablet Take 1 tablet (10 mg total) by mouth daily before breakfast. 30 tablet 11   diazepam (VALIUM) 5 MG tablet As needed     furosemide (LASIX) 40 MG tablet Take 1&1/2 tablets ( 60 mg ) in am and take 1 tablet ( 40 mg ) in pm 90 tablet 3   Multiple Vitamins-Minerals (EYE VITAMINS PO) Take 1 tablet by mouth daily.      spironolactone (ALDACTONE) 25 MG  tablet Take 0.5 tablets (12.5 mg total) by mouth daily. 45 tablet 3   valsartan (DIOVAN) 40 MG tablet Take 0.5 tablets (20 mg total) by mouth daily. 45 tablet 3   Cholecalciferol 50 MCG (2000 UT) TABS Take 1 tablet by mouth daily in the afternoon.     furosemide (LASIX) 40 MG tablet Take 1.5 tablets (60 mg total) by mouth 2 (two) times daily. 90 tablet 3   No facility-administered medications prior to visit.     Allergies:   Patient has no known allergies.   Social History   Socioeconomic History   Marital status: Married    Spouse name: Not on file   Number of children: Not on file   Years of education: Not on file   Highest education level: Not on file  Occupational History   Not on file  Tobacco Use   Smoking status: Never   Smokeless tobacco: Never  Vaping Use   Vaping Use: Never used  Substance and Sexual Activity   Alcohol use: Yes    Alcohol/week: 7.0 standard drinks of alcohol    Types: 7 Glasses of wine per week    Comment: 1 glass of wine everyday    Drug use: Never   Sexual activity: Not on file  Other Topics Concern   Not on file  Social History Narrative   Not on file   Social Determinants of Health   Financial Resource Strain: Not on file  Food Insecurity: Not on file  Transportation Needs: Not on file  Physical Activity: Not on file  Stress: Not on file  Social Connections: Not on file    Socially she is widowed and in [place] resides at [facility].  She typically spends 6 months in [place] during the winter.  She has 2 children.  There is no history of tobacco or alcohol use.  Family History:  The patient's  family history includes Cancer in her mother; Diabetes in her sister; Heart attack in her father and son.   ROS General: Negative; No fevers, chills, or night sweats;  HEENT: Negative; No changes in vision or hearing, sinus congestion, difficulty swallowing Pulmonary: Negative; No cough, wheezing, shortness of breath,  hemoptysis Cardiovascular: See HPI GI: Negative; No nausea, vomiting, diarrhea, or abdominal pain GU: Negative; No dysuria, hematuria, or difficulty voiding Musculoskeletal: Negative; no myalgias, joint pain, or weakness Hematologic/Oncology:, Endocrine: Negative; no heat/cold intolerance; no diabetes Neuro: Negative; no changes in balance, headaches Skin: Negative; No rashes or skin lesions Psychiatric: Negative; No behavioral problems, depression Sleep: Negative; No snoring, daytime sleepiness, hypersomnolence, bruxism, restless legs, hypnogognic hallucinations, no cataplexy Other comprehensive 14 point system review is negative.   PHYSICAL EXAM:   VS:  BP 116/67   Pulse 66   Ht 5\' 4"  (1.626 m)   Wt 133 lb 4.8 oz (60.5 kg)   SpO2 97%   BMI 22.88 kg/m     Repeat blood pressure by me was 110/66 supine; 96/60 standing  Wt Readings from Last 3 Encounters:  01/28/23 133 lb 4.8 oz (60.5 kg)  12/02/22 137 lb 12.8 oz (62.5 kg)  11/27/22 147 lb 9.6 oz (67 kg)    General: Alert, oriented, no distress.  Skin: normal turgor, no rashes, warm and dry HEENT: Normocephalic, atraumatic. Pupils equal round and reactive to light; sclera anicteric; extraocular muscles intact;  Nose without nasal septal hypertrophy Mouth/Parynx benign; Mallinpatti scale 2 Neck: No JVD, no carotid bruits; normal carotid upstroke Lungs: clear to ausculatation and percussion; no wheezing or rales Chest wall: without tenderness to palpitation; wearing a LifeVest.\ Heart: PMI not displaced, RRR, s1 s2 normal, 1/6 systolic murmur, no diastolic murmur, no rubs, gallops, thrills, or heaves Abdomen: soft, nontender; no hepatosplenomehaly, BS+; abdominal aorta nontender and not dilated by palpation. Back: no CVA tenderness Pulses 2+ Musculoskeletal: full range of motion, normal strength, no joint deformities Extremities: no clubbing cyanosis or edema, Homan's sign negative  Neurologic: grossly nonfocal; Cranial nerves  grossly wnl Psychologic: Normal mood and affect   Studies/Labs Reviewed:   May 1, 2024ECG (independently read by me): NSR at 66, LBBB  Recent Labs:    Latest Ref Rng & Units 01/30/2023    9:33 AM 12/22/2022   10:55 AM 12/02/2022   10:52 AM  BMP  Glucose 70 - 99 mg/dL 161  096  045   BUN 10 - 36 mg/dL 31  30  26    Creatinine 0.57 - 1.00 mg/dL 4.09  8.11  9.14   BUN/Creat Ratio 12 - 28 27     Sodium 134 - 144 mmol/L 139  136  138   Potassium 3.5 - 5.2 mmol/L 3.9  3.6  3.5   Chloride 96 - 106 mmol/L 98  97  98   CO2 20 - 29 mmol/L 26  30  30    Calcium 8.7 - 10.3 mg/dL 78.2  95.6  21.3         Latest Ref Rng & Units 01/30/2023    9:33 AM 05/11/2013    1:50 PM 05/18/2012    3:57 PM  Hepatic Function  Total Protein 6.0 - 8.5 g/dL 7.0  7.6  7.8   Albumin 3.6 - 4.6 g/dL 4.0  3.6  3.9   AST 0 - 40 IU/L 22  24  27    ALT 0 - 32 IU/L 15  18  20    Alk Phosphatase 44 - 121 IU/L 116  93  96   Total Bilirubin 0.0 - 1.2 mg/dL 0.5  0.86  0.4        Latest Ref Rng & Units 01/30/2023    9:33 AM 11/27/2022    2:22 PM 05/11/2013    1:50 PM  CBC  WBC 3.4 - 10.8 x10E3/uL 4.8  8.8  5.6   Hemoglobin 11.1 - 15.9 g/dL 57.8  46.9  62.9   Hematocrit 34.0 - 46.6 % 40.8  31.7  37.7   Platelets 150 - 450 x10E3/uL 153  363  174    Lab Results  Component Value Date   MCV 91 01/30/2023   MCV 92.7 11/27/2022   MCV 88.5 05/11/2013   No results found for: "TSH" No results found for: "HGBA1C"   BNP  Component Value Date/Time   BNP WILL FOLLOW 01/30/2023 0933   BNP 610.4 (H) 12/22/2022 1053    ProBNP No results found for: "PROBNP"   Lipid Panel     Component Value Date/Time   CHOL 124 11/27/2022 1422   TRIG 94 11/27/2022 1422   HDL 36 (L) 11/27/2022 1422   CHOLHDL 3.4 11/27/2022 1422   VLDL 19 11/27/2022 1422   LDLCALC 69 11/27/2022 1422     RADIOLOGY: No results found.   Additional studies/ records that were reviewed today include:   I reviewed the records from [place].   I reviewed the records of an Advanced Heart Failure clinic.   ECHO: 12/02/2022  1. Left ventricular ejection fraction, by estimation, is 30 to 35%. The  left ventricle has moderately decreased function. The left ventricle  demonstrates regional wall motion abnormalities with basal to mid  inferolateral and anterolateral akinesis,  basal inferior akinesis. Left ventricular diastolic parameters are  consistent with Grade II diastolic dysfunction (pseudonormalization).   2. Right ventricular systolic function is normal. The right ventricular  size is normal. There is normal pulmonary artery systolic pressure. The  estimated right ventricular systolic pressure is 34.6 mmHg.   3. Left atrial size was mildly dilated.   4. Right atrial size was mildly dilated.   5. Pleural effusion noted.   6. The mitral valve is abnormal. Moderate mitral valve regurgitation,  possible infarct-related MR with lateral wall motion abnormality. No  evidence of mitral stenosis.   7. The aortic valve is tricuspid. There is moderate calcification of the  aortic valve. Aortic valve regurgitation is trivial. No aortic stenosis is  present.   8. The inferior vena cava is dilated in size with >50% respiratory  variability, suggesting right atrial pressure of 8 mmHg.      ASSESSMENT:    1. VF (ventricular fibrillation) cardiac arrest [facility]): February, 2024   2. Acute coronary syndrome [facility]): DES stent to LCX   3. Ischemic cardiomyopathy   4. Hypercoagulable state due to paroxysmal atrial fibrillation (HCC)   5. Hyperlipidemia with target LDL less than 70   6. Paroxysmal atrial fibrillation (HCC)   7. Essential hypertension   8. History of breast cancer     PLAN:  Marissa Carroll is a very pleasant 89 year old female who has been very active for her entire life and was still playing pickle ball and tennis.  She has remote history of breast CVA and had received Adriamycin years ago.  In 2019 she had developed a  DVT following a long car trip.  She developed new onset chest burning in February and the following day due to recurrent chest discomfort she presented to urgent care in [place] and upon checking in had a VF cardiac arrest on November 14, 2022.  She had prompt CPR and was successfully defibrillated.  She ultimately was found to have severe stenosis in her left circumflex coronary artery which was successfully stented.  Her postarrest course was complicated by development of atrial fibrillation for which Eliquis was initiated and CHF for which she received IV Lasix.  Due to postarrest reduced LV function, she was treated with LifeVest in the event of recurrent arrhythmic episodes.  I referred her to Dr. Lucien Mons upon her arrival back to [place] and she has been seen in the advanced heart failure clinic on several occasions.  She was started on guideline directed medical therapy with Marcelline Deist, Lasix was increased to 60 mg twice a day, and she  was treated with carvedilol.  Due to low blood pressure she was just started on valsartan at 20 mg.  A subsequent echo Doppler study on March 5 showed EF at 30 to 35%.  Her BNP has significantly improved from November 27, 2022 value of 1569 to her most recent December 22, 2022 evaluate 610.  On exam today she appears euvolemic.  Her blood pressure is low and there is mild orthostatic drop.  I have suggested at present she continue taking the Lasix 60 mg in the morning but I recommended she change her afternoon dose to 40 mg.  She already has a follow-up appointment to see Dr. Shirlee Latch next week and I anticipate that her Lasix dose can be further reduced.  Prior to his office visit, I have suggested follow-up laboratory and will obtain a comprehensive metabolic panel, BNP, and CBC prior to his evaluation so that he will have these available during that visit.  She is on Eliquis in addition to clopidogrel following her stent implantation.  She is on atorvastatin 40 mg for  hyperlipidemia.  There will need to be discussion concerning potential ICD implantation if LV function remains reduced or if there has been significant return of LV function to allow for LifeVest discontinuance.  Although she is 20, she apparently was very active prior to her cardiac event.  I will discuss this office visit with Dr. Shirlee Latch prior to his evaluation.  I will be happy to see her intermittently in concert with the heart failure clinic or can defer full treatment to them if they so desire.   Medication Adjustments/Labs and Tests Ordered: Current medicines are reviewed at length with the patient today.  Concerns regarding medicines are outlined above.  Medication changes, Labs and Tests ordered today are listed in the Patient Instructions below. Patient Instructions  Medication Instructions:  Take Lasix 1&1/2 tablets ( 60 mg ) in am and take 1 tablet ( 40 mg ) in pm Continue all other medications *If you need a refill on your cardiac medications before your next appointment, please call your pharmacy*   Lab Work: Cmet,cbc,bnp have done Friday 5/3 at Dr.Rhea Thrun's office [facility]  Lab order enclosed   Testing/Procedures: None ordered   Follow-Up: At [facility], you and your health needs are our priority.  As part of our continuing mission to provide you with exceptional heart care, we have created designated Provider Care Teams.  These Care Teams include your primary Cardiologist (physician) and Advanced Practice Providers (APPs -  Physician Assistants and Nurse Practitioners) who all work together to provide you with the care you need, when you need it.  We recommend signing up for the patient portal called "MyChart".  Sign up information is provided on this After Visit Summary.  MyChart is used to connect with patients for Virtual Visits (Telemedicine).  Patients are able to view lab/test results, encounter notes, upcoming appointments, etc.  Non-urgent messages can be sent  to your provider as well.   To learn more about what you can do with MyChart, go to [email or web address].    Your next appointment:  3 to 4 months    Provider:  Dr.Annaly Skop     Signed, Nicki Guadalajara, MD  01/31/2023 9:43 AM    [facility] [address], [address], [place], [place]  [address] Phone: [phone]

## 2023-01-29 ENCOUNTER — Telehealth: Payer: Self-pay | Admitting: Cardiovascular Disease

## 2023-01-29 NOTE — Telephone Encounter (Signed)
Pt c/o medication issue:  1. Name of Medication:   Cholecalciferol 50 MCG (2000 UT) TABS   2. How are you currently taking this medication (dosage and times per day)?   Not taking  3. Are you having a reaction (difficulty breathing--STAT)?  N/A  4. What is your medication issue?    Patient states this medication is on her list but she has not been taking it.

## 2023-01-29 NOTE — Telephone Encounter (Signed)
Spoke to patient, medication removed from med list.

## 2023-01-30 DIAGNOSIS — I10 Essential (primary) hypertension: Secondary | ICD-10-CM | POA: Diagnosis not present

## 2023-01-30 DIAGNOSIS — I2510 Atherosclerotic heart disease of native coronary artery without angina pectoris: Secondary | ICD-10-CM | POA: Diagnosis not present

## 2023-01-30 DIAGNOSIS — R0602 Shortness of breath: Secondary | ICD-10-CM | POA: Diagnosis not present

## 2023-01-30 DIAGNOSIS — I5022 Chronic systolic (congestive) heart failure: Secondary | ICD-10-CM | POA: Diagnosis not present

## 2023-01-31 ENCOUNTER — Encounter: Payer: Self-pay | Admitting: Cardiovascular Disease

## 2023-01-31 LAB — CBC WITH DIFFERENTIAL/PLATELET
Basophils Absolute: 0 10*3/uL (ref 0.0–0.2)
Basos: 1 %
EOS (ABSOLUTE): 0.2 10*3/uL (ref 0.0–0.4)
Eos: 3 %
Hematocrit: 40.8 % (ref 34.0–46.6)
Hemoglobin: 13.1 g/dL (ref 11.1–15.9)
Immature Grans (Abs): 0 10*3/uL (ref 0.0–0.1)
Immature Granulocytes: 0 %
Lymphocytes Absolute: 1.1 10*3/uL (ref 0.7–3.1)
Lymphs: 22 %
MCH: 29.2 pg (ref 26.6–33.0)
MCHC: 32.1 g/dL (ref 31.5–35.7)
MCV: 91 fL (ref 79–97)
Monocytes Absolute: 0.6 10*3/uL (ref 0.1–0.9)
Monocytes: 13 %
Neutrophils Absolute: 2.9 10*3/uL (ref 1.4–7.0)
Neutrophils: 61 %
Platelets: 153 10*3/uL (ref 150–450)
RBC: 4.49 x10E6/uL (ref 3.77–5.28)
RDW: 13.2 % (ref 11.7–15.4)
WBC: 4.8 10*3/uL (ref 3.4–10.8)

## 2023-01-31 LAB — COMPREHENSIVE METABOLIC PANEL
ALT: 15 IU/L (ref 0–32)
AST: 22 IU/L (ref 0–40)
Albumin/Globulin Ratio: 1.3 (ref 1.2–2.2)
Albumin: 4 g/dL (ref 3.6–4.6)
Alkaline Phosphatase: 116 IU/L (ref 44–121)
BUN/Creatinine Ratio: 27 (ref 12–28)
BUN: 31 mg/dL (ref 10–36)
Bilirubin Total: 0.5 mg/dL (ref 0.0–1.2)
CO2: 26 mmol/L (ref 20–29)
Calcium: 10.6 mg/dL — ABNORMAL HIGH (ref 8.7–10.3)
Chloride: 98 mmol/L (ref 96–106)
Creatinine, Ser: 1.16 mg/dL — ABNORMAL HIGH (ref 0.57–1.00)
Globulin, Total: 3 g/dL (ref 1.5–4.5)
Glucose: 173 mg/dL — ABNORMAL HIGH (ref 70–99)
Potassium: 3.9 mmol/L (ref 3.5–5.2)
Sodium: 139 mmol/L (ref 134–144)
Total Protein: 7 g/dL (ref 6.0–8.5)
eGFR: 44 mL/min/{1.73_m2} — ABNORMAL LOW (ref >59)

## 2023-01-31 LAB — BRAIN NATRIURETIC PEPTIDE: BNP: 331.2 pg/mL — ABNORMAL HIGH (ref 0.0–100.0)

## 2023-02-03 ENCOUNTER — Other Ambulatory Visit: Payer: Self-pay

## 2023-02-03 ENCOUNTER — Ambulatory Visit (HOSPITAL_BASED_OUTPATIENT_CLINIC_OR_DEPARTMENT_OTHER)
Admission: RE | Admit: 2023-02-03 | Discharge: 2023-02-03 | Disposition: A | Discharge status: Home / Self Care | Payer: Medicare Other | Source: Ambulatory Visit | Attending: Cardiology | Admitting: Cardiology

## 2023-02-03 ENCOUNTER — Ambulatory Visit (HOSPITAL_COMMUNITY)
Admission: RE | Admit: 2023-02-03 | Discharge: 2023-02-03 | Disposition: A | Discharge status: Home / Self Care | Payer: Medicare Other | Source: Ambulatory Visit | Attending: Cardiology | Admitting: Cardiology

## 2023-02-03 ENCOUNTER — Encounter (HOSPITAL_COMMUNITY): Payer: Self-pay | Admitting: Cardiology

## 2023-02-03 DIAGNOSIS — Z79899 Other long term (current) drug therapy: Secondary | ICD-10-CM | POA: Insufficient documentation

## 2023-02-03 DIAGNOSIS — I252 Old myocardial infarction: Secondary | ICD-10-CM | POA: Insufficient documentation

## 2023-02-03 DIAGNOSIS — Z7984 Long term (current) use of oral hypoglycemic drugs: Secondary | ICD-10-CM | POA: Insufficient documentation

## 2023-02-03 DIAGNOSIS — I480 Paroxysmal atrial fibrillation: Secondary | ICD-10-CM | POA: Diagnosis not present

## 2023-02-03 DIAGNOSIS — I5022 Chronic systolic (congestive) heart failure: Secondary | ICD-10-CM | POA: Diagnosis not present

## 2023-02-03 DIAGNOSIS — I2510 Atherosclerotic heart disease of native coronary artery without angina pectoris: Secondary | ICD-10-CM | POA: Diagnosis not present

## 2023-02-03 DIAGNOSIS — I110 Hypertensive heart disease with heart failure: Secondary | ICD-10-CM | POA: Insufficient documentation

## 2023-02-03 DIAGNOSIS — Z7902 Long term (current) use of antithrombotics/antiplatelets: Secondary | ICD-10-CM | POA: Diagnosis not present

## 2023-02-03 DIAGNOSIS — Z86718 Personal history of other venous thrombosis and embolism: Secondary | ICD-10-CM | POA: Insufficient documentation

## 2023-02-03 DIAGNOSIS — I447 Left bundle-branch block, unspecified: Secondary | ICD-10-CM | POA: Insufficient documentation

## 2023-02-03 DIAGNOSIS — Z8674 Personal history of sudden cardiac arrest: Secondary | ICD-10-CM | POA: Diagnosis not present

## 2023-02-03 DIAGNOSIS — Z7901 Long term (current) use of anticoagulants: Secondary | ICD-10-CM | POA: Insufficient documentation

## 2023-02-03 DIAGNOSIS — I255 Ischemic cardiomyopathy: Secondary | ICD-10-CM | POA: Insufficient documentation

## 2023-02-03 LAB — ECHOCARDIOGRAM COMPLETE
AR max vel: 1.6 cm2
AV Area VTI: 1.6 cm2
AV Area mean vel: 1.62 cm2
AV Mean grad: 5.5 mmHg
AV Peak grad: 10.9 mmHg
Ao pk vel: 1.65 m/s
Area-P 1/2: 1.87 cm2
Calc EF: 47.5 %
MV VTI: 1.49 cm2
S' Lateral: 3 cm
Single Plane A2C EF: 48.9 %
Single Plane A4C EF: 44.3 %

## 2023-02-03 MED ORDER — SPIRONOLACTONE 25 MG PO TABS: 25.0000 mg | ORAL_TABLET | Freq: Every day | ORAL | 3 refills | Status: DC

## 2023-02-03 MED ORDER — VALSARTAN 40 MG PO TABS: 20.0000 mg | ORAL_TABLET | Freq: Two times a day (BID) | ORAL | 3 refills | Status: DC

## 2023-02-03 MED ORDER — FUROSEMIDE 40 MG PO TABS: 40.0000 mg | ORAL_TABLET | Freq: Two times a day (BID) | ORAL | 3 refills | Status: DC

## 2023-02-03 MED ORDER — NITROGLYCERIN 0.4 MG SL SUBL: 0.4000 mg | SUBLINGUAL_TABLET | SUBLINGUAL | 3 refills | Status: AC | PRN

## 2023-02-03 NOTE — Patient Instructions (Addendum)
CHANGE Lasix to 40 mg Twice daily  INCREASE Spironolactone to 25 mg daily  CHANGE Valsartan to 20 mg Twice daily   DISCONTINUE YOUR LIFE VEST.  Blood work in 10 days.  You have been referred to cardiac Rehab. They will call you to arrange your appointment.  Your physician recommends that you schedule a follow-up appointment in: 6 weeks  If you have any questions or concerns before your next appointment please send [place] a message through [facility] or call our office at [phone].    TO LEAVE A MESSAGE FOR THE NURSE SELECT OPTION 2, PLEASE LEAVE A MESSAGE INCLUDING: YOUR NAME DATE OF BIRTH CALL BACK NUMBER REASON FOR CALL**this is important as we prioritize the call backs  YOU WILL RECEIVE A CALL BACK THE SAME DAY AS LONG AS YOU CALL BEFORE 4:00 PM  At the Advanced Heart Failure Clinic, you and your health needs are our priority. As part of our continuing mission to provide you with exceptional heart care, we have created designated Provider Care Teams. These Care Teams include your primary Cardiologist (physician) and Advanced Practice Providers (APPs- Physician Assistants and Nurse Practitioners) who all work together to provide you with the care you need, when you need it.   You may see any of the following providers on your designated Care Team at your next follow up: Dr Arvilla Meres Dr [name] Dr. Marcos Eke, NP Robbie Lis, [place] [facility] [place], [place] Brynda Peon, NP Karle Plumber, PharmD   Please be sure to bring in all your medications bottles to every appointment.    Thank you for choosing  [facility]

## 2023-02-04 ENCOUNTER — Other Ambulatory Visit (HOSPITAL_COMMUNITY): Payer: Self-pay

## 2023-02-04 DIAGNOSIS — I5022 Chronic systolic (congestive) heart failure: Secondary | ICD-10-CM

## 2023-02-04 NOTE — Progress Notes (Addendum)
PCP: Geoffry Paradise, MD Cardiology: Dr. Shirlee Latch  89 y.o. with history of breast cancer, chronic LBBB, and HTN was admitted to a hospital in [place], [place] with NSTEMI and VF arrest in 2/24.  She was referred by Dr. Jacky Kindle for evaluation of CHF, atrial fibrillation, and acute systolic CHF.  Prior to MI in 2/24, patient was quite healthy.  She played tennis and pickleball. She had no exertional dyspnea or chest pain.  She had a DVT in 2019 after a long car trip.  She had also, of note, received Adriamycin years ago with treatment of breast cancer.   In 2/24, she developed severe "heartburn" one evening.  Meds for heartburn did not help.  Pain eventually eased off and she was able to sleep.  However, chest pain returned again the next day and she went to the hospital.  While checking into the ER, she had a VF arrest requiring defibrillation and CPR. She broke several ribs. She was taken for cath, this showed severe stenosis in LCx that was treated with DES.  She developed atrial fibrillation while in the hospital and Eliquis was started.  She also was noted to have CHF and received IV Lasix. She was discharged with a Lifevest. At my initial visit with her, she was short of breath and volume overloaded, Lasix was increased.  Echo in 3/24 showed EF 30-35%, moderate infarct-related MR, normal RV.   Echo was done today and reviewed, EF 35-40% with basal to mid inferolateral and anterolateral akinesis, RV normal, PASP 31 mmHg, trivial MR.   She returns for followup of CHF.  She is doing better overall. Weight down 7 lbs.  No significant exertional dyspnea.  Walking for exercise with no problems.  No exertional chest pain.  No orthopnea/PND.  No lightheadedness/falls.  She is now back at home living by herself.   ECG (personally reviewed): NSR, PAC, LBBB-like IVCD 128 msec  Labs (2/24): LDL 69, HDL 36 Labs (5/24): K 3.9, creatinine 1.16, BNP 331, hgb 13.1  PMH: 1. LBBB: Chronic 2. HTN 3. Breast cancer:  Treated in 2008 with surgery and radiation.  She had Adriamycin.  4. Hyperlipidemia 5. DVT: 2019, in setting of long car ride.  6. Atrial fibrillation: Paroxysmal.  First noted during admission for MI in 2024.  7. CAD: NSTEMI 2/24, treated in [place], [place].  Cath showed severe stenosis in LCx, she had DES placed.  8. Chronic systolic CHF: Echo in 9/19 with EF 50-55%.  Ischemic cardiomyopathy.  Echo from 2/24 in [place] per family's report showed a weak heart.  - Echo (3/24): EF 30-35%, moderate infarct-related MR, normal RV.  - Echo (5/24): EF 35-40% with basal to mid inferolateral and anterolateral akinesis, RV normal, PASP 31 mmHg, trivial MR.  9. VF arrest: In 2/24 in the setting of MI.    SH: Widow, lives in [place].  2 children.  No smoking.  No ETOH.   FH: Son with CABG.   ROS: All systems reviewed and negative except as per HPI.   Current Outpatient Medications  Medication Sig Dispense Refill   apixaban (ELIQUIS) 5 MG TABS tablet Take 1 tablet (5 mg total) by mouth 2 (two) times daily. 60 tablet 11   atorvastatin (LIPITOR) 40 MG tablet Take 1 tablet (40 mg total) by mouth daily. 90 tablet 3   carvedilol (COREG) 3.125 MG tablet Take 1 tablet (3.125 mg total) by mouth 2 (two) times daily with a meal. 60 tablet 11   clopidogrel (PLAVIX) 75  MG tablet Take 1 tablet (75 mg total) by mouth daily. 30 tablet 11   dapagliflozin propanediol (FARXIGA) 10 MG TABS tablet Take 1 tablet (10 mg total) by mouth daily before breakfast. 30 tablet 11   diazepam (VALIUM) 5 MG tablet As needed     Multiple Vitamins-Minerals (EYE VITAMINS PO) Take 1 tablet by mouth daily.      nitroGLYCERIN (NITROSTAT) 0.4 MG SL tablet Place 1 tablet (0.4 mg total) under the tongue every 5 (five) minutes as needed for chest pain. 90 tablet 3   furosemide (LASIX) 40 MG tablet Take 1 tablet (40 mg total) by mouth 2 (two) times daily. 90 tablet 3   spironolactone (ALDACTONE) 25 MG tablet Take 1 tablet (25 mg total) by mouth  daily. 90 tablet 3   valsartan (DIOVAN) 40 MG tablet Take 0.5 tablets (20 mg total) by mouth 2 (two) times daily. 90 tablet 3   No current facility-administered medications for this encounter.   BP 108/60   Pulse 70   Wt 59.1 kg (130 lb 6.4 oz)   SpO2 96%   BMI 22.38 kg/m  General: NAD Neck: No JVD, no thyromegaly or thyroid nodule.  Lungs: Clear to auscultation bilaterally with normal respiratory effort. CV: Nondisplaced PMI.  Heart regular S1/S2, no S3/S4, no murmur.  No peripheral edema.  No carotid bruit.  Normal pedal pulses.  Abdomen: Soft, nontender, no hepatosplenomegaly, no distention.  Skin: Intact without lesions or rashes.  Neurologic: Alert and oriented x 3.  Psych: Normal affect. Extremities: No clubbing or cyanosis.  HEENT: Normal.   Assessment/Plan: 1. CAD: NSTEMI in 2/24 with DES to LCx.  Complicated by VF arrest, acute systolic CHF, and atrial fibrillation.  No further chest pain.  ECG with LBBB-like IVCD.  - Continue Plavix 75 daily.  She is not on ASA given Eliquis use. Will continue Plavix for up to a year as long as no bleeding issues.  - Continue atorvastatin 40 mg daily, needs lipids/LFTs checked with her 10 day BMET.  - Refer to cardiac rehab. - Will give her a prescription for sublingual NTG.  2. Atrial fibrillation: Paroxysmal.  First noted in 2/24 with MI.  She is in NSR today.  - Continue Eliquis.  - Continue Coreg.  3. Chronic systolic CHF: Ischemic cardiomyopathy. Echo in [place], [place] at time of NSTEMI apparently showed low EF per family's report.  Echo in 3/24 with EF 30-35%, moderate infarct-related MR, normal RV.  Echo today showed EF 35-40%, trivial MR, normal RV.  NYHA class I-II, not volume overloaded on exam.  Doing better than at last appt.  - She can increase valsartan to 40 mg bid.  BMET 10 days.  - Continue Farxiga 10 mg daily.  - Increase spironolactone to 25 mg daily.  - Decrease Lasix to 40 mg bid.   - She can remove Lifevest.  EF > 35%  at this point and no further ventricular arrhythmias. LBBB-like IVCD is not wide enough that she would be likely to show benefit with CRT.  4. VF arrest: In setting of MI in [place] in 2/24. Given revascularization, ICD was not placed during her admission.  - EF > 35%, removing LIfevest. .   Followup APP in 6 weeks.   [name] 02/04/2023

## 2023-02-06 ENCOUNTER — Telehealth (HOSPITAL_COMMUNITY): Payer: Self-pay

## 2023-02-06 DIAGNOSIS — I10 Essential (primary) hypertension: Secondary | ICD-10-CM | POA: Diagnosis not present

## 2023-02-06 DIAGNOSIS — E876 Hypokalemia: Secondary | ICD-10-CM | POA: Diagnosis not present

## 2023-02-06 DIAGNOSIS — E785 Hyperlipidemia, unspecified: Secondary | ICD-10-CM | POA: Diagnosis not present

## 2023-02-06 DIAGNOSIS — I4901 Ventricular fibrillation: Secondary | ICD-10-CM | POA: Diagnosis not present

## 2023-02-06 DIAGNOSIS — F419 Anxiety disorder, unspecified: Secondary | ICD-10-CM | POA: Diagnosis not present

## 2023-02-06 DIAGNOSIS — S2231XD Fracture of one rib, right side, subsequent encounter for fracture with routine healing: Secondary | ICD-10-CM | POA: Diagnosis not present

## 2023-02-06 NOTE — Telephone Encounter (Signed)
Called and spoke with pt in regards to CR, pt stated she is not interested at this time.   Closed referral 

## 2023-02-13 ENCOUNTER — Ambulatory Visit (HOSPITAL_COMMUNITY)
Admission: RE | Admit: 2023-02-13 | Discharge: 2023-02-13 | Disposition: A | Discharge status: Home / Self Care | Payer: Medicare Other | Source: Ambulatory Visit | Attending: Cardiology | Admitting: Cardiology

## 2023-02-13 DIAGNOSIS — I5022 Chronic systolic (congestive) heart failure: Secondary | ICD-10-CM

## 2023-02-13 LAB — BASIC METABOLIC PANEL
Anion gap: 8 (ref 5–15)
BUN: 29 mg/dL — ABNORMAL HIGH (ref 8–23)
CO2: 29 mmol/L (ref 22–32)
Calcium: 10.6 mg/dL — ABNORMAL HIGH (ref 8.9–10.3)
Chloride: 99 mmol/L (ref 98–111)
Creatinine, Ser: 1.26 mg/dL — ABNORMAL HIGH (ref 0.44–1.00)
GFR, Estimated: 40 mL/min — ABNORMAL LOW (ref >60)
Glucose, Bld: 174 mg/dL — ABNORMAL HIGH (ref 70–99)
Potassium: 3.5 mmol/L (ref 3.5–5.1)
Sodium: 136 mmol/L (ref 135–145)

## 2023-02-13 LAB — LIPID PANEL
Cholesterol: 106 mg/dL (ref 0–200)
HDL: 39 mg/dL — ABNORMAL LOW (ref >40)
LDL Cholesterol: 50 mg/dL (ref 0–99)
Total CHOL/HDL Ratio: 2.7 RATIO
Triglycerides: 87 mg/dL (ref <150)
VLDL: 17 mg/dL (ref 0–40)

## 2023-02-13 LAB — HEPATIC FUNCTION PANEL
ALT: 18 U/L (ref 0–44)
AST: 22 U/L (ref 15–41)
Albumin: 3.6 g/dL (ref 3.5–5.0)
Alkaline Phosphatase: 93 U/L (ref 38–126)
Bilirubin, Direct: 0.1 mg/dL (ref 0.0–0.2)
Indirect Bilirubin: 0.5 mg/dL (ref 0.3–0.9)
Total Bilirubin: 0.6 mg/dL (ref 0.3–1.2)
Total Protein: 7 g/dL (ref 6.5–8.1)

## 2023-02-17 ENCOUNTER — Telehealth (HOSPITAL_COMMUNITY): Payer: Self-pay

## 2023-02-17 NOTE — Telephone Encounter (Signed)
Pt called back and changed her mind and is now interested in cardiac rehab.

## 2023-02-18 ENCOUNTER — Telehealth (HOSPITAL_COMMUNITY): Payer: Self-pay

## 2023-02-18 DIAGNOSIS — Z1231 Encounter for screening mammogram for malignant neoplasm of breast: Secondary | ICD-10-CM | POA: Diagnosis not present

## 2023-02-18 NOTE — Telephone Encounter (Signed)
Pt called and wanted to schedule for cardiac rehab, pt will come in for orientation on 02/24/23@1030  and will attend the 1015 class time.

## 2023-02-19 DIAGNOSIS — M2041 Other hammer toe(s) (acquired), right foot: Secondary | ICD-10-CM | POA: Diagnosis not present

## 2023-02-19 DIAGNOSIS — M2042 Other hammer toe(s) (acquired), left foot: Secondary | ICD-10-CM | POA: Diagnosis not present

## 2023-02-19 DIAGNOSIS — M19072 Primary osteoarthritis, left ankle and foot: Secondary | ICD-10-CM | POA: Diagnosis not present

## 2023-02-19 DIAGNOSIS — M792 Neuralgia and neuritis, unspecified: Secondary | ICD-10-CM | POA: Diagnosis not present

## 2023-02-19 DIAGNOSIS — M67471 Ganglion, right ankle and foot: Secondary | ICD-10-CM | POA: Diagnosis not present

## 2023-02-19 DIAGNOSIS — M6740 Ganglion, unspecified site: Secondary | ICD-10-CM | POA: Diagnosis not present

## 2023-02-19 DIAGNOSIS — M19071 Primary osteoarthritis, right ankle and foot: Secondary | ICD-10-CM | POA: Diagnosis not present

## 2023-02-20 ENCOUNTER — Telehealth (HOSPITAL_COMMUNITY): Payer: Self-pay

## 2023-02-20 NOTE — Telephone Encounter (Signed)
Reviewed with patient the Cardiac Rehab Cardiac Risk [facility] Assessment. Patient knows where our office is located, and to wear comfortable clothing/shoes.

## 2023-02-24 ENCOUNTER — Encounter (HOSPITAL_COMMUNITY): Payer: Self-pay

## 2023-02-24 ENCOUNTER — Encounter (HOSPITAL_COMMUNITY)
Admission: RE | Admit: 2023-02-24 | Discharge: 2023-02-24 | Disposition: A | Discharge status: Home / Self Care | Payer: Medicare Other | Source: Ambulatory Visit | Attending: Cardiology | Admitting: Cardiology

## 2023-02-24 VITALS — BP 88/50 | HR 60 | Ht 64.0 in | Wt 132.1 lb

## 2023-02-24 DIAGNOSIS — Z955 Presence of coronary angioplasty implant and graft: Secondary | ICD-10-CM

## 2023-02-24 DIAGNOSIS — I214 Non-ST elevation (NSTEMI) myocardial infarction: Secondary | ICD-10-CM | POA: Diagnosis not present

## 2023-02-24 HISTORY — DX: Hyperlipidemia, unspecified: E78.5

## 2023-02-24 HISTORY — DX: Heart failure, unspecified: I50.9

## 2023-02-24 HISTORY — DX: Atherosclerotic heart disease of native coronary artery without angina pectoris: I25.10

## 2023-02-24 NOTE — Progress Notes (Signed)
Introduction and [facility] education program/intensive cardiac rehab initial orientation packet reviewed with patient.  

## 2023-02-24 NOTE — Progress Notes (Signed)
Cardiac Individual Treatment Plan  Patient Details  Name: Marissa Carroll MRN: [id number] Date of Birth: 1928/06/15 Referring Provider:   Flowsheet Row INTENSIVE CARDIAC REHAB ORIENT from 02/24/2023 in [facility]  Referring Provider [name], MD       Initial Encounter Date:  Flowsheet Row INTENSIVE CARDIAC REHAB ORIENT from 02/24/2023 in [facility]  Date 02/24/23       Visit Diagnosis: 11/14/22 Status post coronary artery stent placement  11/14/22 NSTEMI (non-ST elevated myocardial infarction) [facility])  Patient's Home Medications on Admission:  Current Outpatient Medications:    apixaban (ELIQUIS) 5 MG TABS tablet, Take 1 tablet (5 mg total) by mouth 2 (two) times daily., Disp: 60 tablet, Rfl: 11   atorvastatin (LIPITOR) 40 MG tablet, Take 1 tablet (40 mg total) by mouth daily., Disp: 90 tablet, Rfl: 3   carvedilol (COREG) 3.125 MG tablet, Take 1 tablet (3.125 mg total) by mouth 2 (two) times daily with a meal., Disp: 60 tablet, Rfl: 11   clopidogrel (PLAVIX) 75 MG tablet, Take 1 tablet (75 mg total) by mouth daily., Disp: 30 tablet, Rfl: 11   dapagliflozin propanediol (FARXIGA) 10 MG TABS tablet, Take 1 tablet (10 mg total) by mouth daily before breakfast., Disp: 30 tablet, Rfl: 11   diazepam (VALIUM) 5 MG tablet, As needed, Disp: , Rfl:    furosemide (LASIX) 40 MG tablet, Take 1 tablet (40 mg total) by mouth 2 (two) times daily., Disp: 90 tablet, Rfl: 3   Multiple Vitamins-Minerals (EYE VITAMINS PO), Take 1 tablet by mouth daily. , Disp: , Rfl:    nitroGLYCERIN (NITROSTAT) 0.4 MG SL tablet, Place 1 tablet (0.4 mg total) under the tongue every 5 (five) minutes as needed for chest pain., Disp: 90 tablet, Rfl: 3   spironolactone (ALDACTONE) 25 MG tablet, Take 1 tablet (25 mg total) by mouth daily., Disp: 90 tablet, Rfl: 3   valsartan (DIOVAN) 40 MG tablet, Take 0.5  tablets (20 mg total) by mouth 2 (two) times daily., Disp: 90 tablet, Rfl: 3  Past Medical History: Past Medical History:  Diagnosis Date   CHF (congestive heart failure) (HCC)    Coronary artery disease    History of breast cancer    Hyperlipidemia    Hypertension     Tobacco Use: Social History   Tobacco Use  Smoking Status Never  Smokeless Tobacco Never    Labs: Review Flowsheet       Latest Ref Rng & Units 11/27/2022 02/13/2023  Labs for ITP Cardiac and Pulmonary Rehab  Cholestrol 0 - 200 mg/dL 409  811   LDL (calc) 0 - 99 mg/dL 69  50   HDL-C >91 mg/dL 36  39   Trlycerides <478 mg/dL 94  87     Capillary Blood Glucose: No results found for: "GLUCAP"   Exercise Target Goals: Exercise Program Goal: Individual exercise prescription set using results from initial 6 min walk test and THRR while considering  patient's activity barriers and safety.   Exercise Prescription Goal: Initial exercise prescription builds to 30-45 minutes a day of aerobic activity, 2-3 days per week.  Home exercise guidelines will be given to patient during program as part of exercise prescription that the participant will acknowledge.  Activity Barriers & Risk Stratification:  Activity Barriers & Cardiac Risk Stratification - 02/24/23 1427       Activity Barriers & Cardiac Risk Stratification  Activity Barriers Joint Problems;Deconditioning;Muscular Weakness;Decreased Ventricular Function;Balance Concerns;Arthritis    Cardiac Risk Stratification High             6 Minute Walk:  6 Minute Walk     Row Name 02/24/23 1143         6 Minute Walk   Phase Initial     Distance 1274 feet     Walk Time 6 minutes     # of Rest Breaks 0     MPH 2.41     METS 1.7     RPE 11     Perceived Dyspnea  0     VO2 Peak 5.8     Symptoms Yes (comment)     Comments Bilateral Hip pain 3/10     Resting HR 64 bpm     Resting BP 88/50     Resting Oxygen Saturation  99 %     Exercise Oxygen  Saturation  during 6 min walk 100 %     Max Ex. HR 83 bpm     Max Ex. BP 126/70     2 Minute Post BP 110/68              Oxygen Initial Assessment:   Oxygen Re-Evaluation:   Oxygen Discharge (Final Oxygen Re-Evaluation):   Initial Exercise Prescription:  Initial Exercise Prescription - 02/24/23 1400       Date of Initial Exercise RX and Referring Provider   Date 02/24/23    Referring Provider [name], MD    Expected Discharge Date 05/08/23      Recumbant Bike   Level 1    RPM 60    Watts 25    Minutes 15    METs 1.7      NuStep   Level 2    SPM 75    Minutes 15    METs 1.7      Prescription Details   Frequency (times per week) 3    Duration Progress to 30 minutes of continuous aerobic without signs/symptoms of physical distress      Intensity   THRR 40-80% of Max Heartrate 51-102    Ratings of Perceived Exertion 11-13    Perceived Dyspnea 0-4      Progression   Progression Continue progressive overload as per policy without signs/symptoms or physical distress.      Resistance Training   Training Prescription Yes    Weight 2 lbs    Reps 10-15             Perform Capillary Blood Glucose checks as needed.  Exercise Prescription Changes:   Exercise Comments:   Exercise Goals and Review:   Exercise Goals     Row Name 02/24/23 1429             Exercise Goals   Increase Physical Activity Yes       Intervention Provide advice, education, support and counseling about physical activity/exercise needs.;Develop an individualized exercise prescription for aerobic and resistive training based on initial evaluation findings, risk stratification, comorbidities and participant's personal goals.       Expected Outcomes Short Term: Attend rehab on a regular basis to increase amount of physical activity.;Long Term: Add in home exercise to make exercise part of routine and to increase amount of physical activity.;Long Term: Exercising regularly  at least 3-5 days a week.       Increase Strength and Stamina Yes       Intervention Provide advice, education, support  and counseling about physical activity/exercise needs.;Develop an individualized exercise prescription for aerobic and resistive training based on initial evaluation findings, risk stratification, comorbidities and participant's personal goals.       Expected Outcomes Short Term: Increase workloads from initial exercise prescription for resistance, speed, and METs.;Short Term: Perform resistance training exercises routinely during rehab and add in resistance training at home;Long Term: Improve cardiorespiratory fitness, muscular endurance and strength as measured by increased METs and functional capacity ( )       Able to understand and use rate of perceived exertion (RPE) scale Yes       Intervention Provide education and explanation on how to use RPE scale       Expected Outcomes Short Term: Able to use RPE daily in rehab to express subjective intensity level;Long Term:  Able to use RPE to guide intensity level when exercising independently       Knowledge and understanding of Target Heart Rate Range (THRR) Yes       Intervention Provide education and explanation of THRR including how the numbers were predicted and where they are located for reference       Expected Outcomes Short Term: Able to state/look up THRR;Short Term: Able to use daily as guideline for intensity in rehab;Long Term: Able to use THRR to govern intensity when exercising independently       Understanding of Exercise Prescription Yes       Intervention Provide education, explanation, and written materials on patient's individual exercise prescription       Expected Outcomes Short Term: Able to explain program exercise prescription;Long Term: Able to explain home exercise prescription to exercise independently                Exercise Goals Re-Evaluation :   Discharge Exercise Prescription (Final  Exercise Prescription Changes):   Nutrition:  Target Goals: Understanding of nutrition guidelines, daily intake of sodium 1500mg , cholesterol 200mg , calories 30% from fat and 7% or less from saturated fats, daily to have 5 or more servings of fruits and vegetables.  Biometrics:  Pre Biometrics - 02/24/23 1025       Pre Biometrics   Waist Circumference 35 inches    Hip Circumference 37.5 inches    Waist to Hip Ratio 0.93 %    Triceps Skinfold 11 mm    % Body Fat 33.4 %    Grip Strength 17 kg    Flexibility 16.75 in    Single Leg Stand 2.37 seconds              Nutrition Therapy Plan and Nutrition Goals:   Nutrition Assessments:  MEDIFICTS Score Key: ?70 Need to make dietary changes  40-70 Heart Healthy Diet ? 40 Therapeutic Level Cholesterol Diet    Picture Your Plate Scores: <16 Unhealthy dietary pattern with much room for improvement. 41-50 Dietary pattern unlikely to meet recommendations for good health and room for improvement. 51-60 More healthful dietary pattern, with some room for improvement.  >60 Healthy dietary pattern, although there may be some specific behaviors that could be improved.    Nutrition Goals Re-Evaluation:   Nutrition Goals Re-Evaluation:   Nutrition Goals Discharge (Final Nutrition Goals Re-Evaluation):   Psychosocial: Target Goals: Acknowledge presence or absence of significant depression and/or stress, maximize coping skills, provide positive support system. Participant is able to verbalize types and ability to use techniques and skills needed for reducing stress and depression.  Initial Review & Psychosocial Screening:  Initial Psych Review & Screening - 02/24/23  1431       Initial Review   Current issues with None Identified      Family Dynamics   Good Support System? Yes   Has daughter for support     Barriers   Psychosocial barriers to participate in program There are no identifiable barriers or psychosocial needs.       Screening Interventions   Interventions Encouraged to exercise             Quality of Life Scores:  Quality of Life - 02/24/23 1432       Quality of Life   Select Quality of Life      Quality of Life Scores   Health/Function Pre 23.38 %    Socioeconomic Pre 30 %    Psych/Spiritual Pre 29.64 %    Family Pre 30 %    GLOBAL Pre 29.19 %            Scores of 19 and below usually indicate a poorer quality of life in these areas.  A difference of  2-3 points is a clinically meaningful difference.  A difference of 2-3 points in the total score of the Quality of Life Index has been associated with significant improvement in overall quality of life, self-image, physical symptoms, and general health in studies assessing change in quality of life.  PHQ-9: Review Flowsheet       02/24/2023  Depression screen PHQ 2/9  Decreased Interest 0  Down, Depressed, Hopeless 0  PHQ - 2 Score 0  Altered sleeping 0  Tired, decreased energy 0  Change in appetite 0  Feeling bad or failure about yourself  0  Trouble concentrating 0  Moving slowly or fidgety/restless 0  Suicidal thoughts 0  PHQ-9 Score 0  Difficult doing work/chores Not difficult at all   Interpretation of Total Score  Total Score Depression Severity:  1-4 = Minimal depression, 5-9 = Mild depression, 10-14 = Moderate depression, 15-19 = Moderately severe depression, 20-27 = Severe depression   Psychosocial Evaluation and Intervention:   Psychosocial Re-Evaluation:   Psychosocial Discharge (Final Psychosocial Re-Evaluation):   Vocational Rehabilitation: Provide vocational rehab assistance to qualifying candidates.   Vocational Rehab Evaluation & Intervention:  Vocational Rehab - 02/24/23 1434       Initial Vocational Rehab Evaluation & Intervention   Assessment shows need for Vocational Rehabilitation No   Pt is retired            Education: Education Goals: Education classes will be provided on a  weekly basis, covering required topics. Participant will state understanding/return demonstration of topics presented.     Core Videos: Exercise    Move It!  Clinical staff conducted group or individual video education with verbal and written material and guidebook.  Patient learns the recommended Pritikin exercise program. Exercise with the goal of living a long, healthy life. Some of the health benefits of exercise include controlled diabetes, healthier blood pressure levels, improved cholesterol levels, improved heart and lung capacity, improved sleep, and better body composition. Everyone should speak with their doctor before starting or changing an exercise routine.  Biomechanical Limitations Clinical staff conducted group or individual video education with verbal and written material and guidebook.  Patient learns how biomechanical limitations can impact exercise and how we can mitigate and possibly overcome limitations to have an impactful and balanced exercise routine.  Body Composition Clinical staff conducted group or individual video education with verbal and written material and guidebook.  Patient learns that body composition (  ratio of muscle mass to fat mass) is a key component to assessing overall fitness, rather than body weight alone. Increased fat mass, especially visceral belly fat, can put [place] at increased risk for metabolic syndrome, type 2 diabetes, heart disease, and even death. It is recommended to combine diet and exercise (cardiovascular and resistance training) to improve your body composition. Seek guidance from your physician and exercise physiologist before implementing an exercise routine.  Exercise Action Plan Clinical staff conducted group or individual video education with verbal and written material and guidebook.  Patient learns the recommended strategies to achieve and enjoy long-term exercise adherence, including variety, self-motivation, self-efficacy, and  positive decision making. Benefits of exercise include fitness, good health, weight management, more energy, better sleep, less stress, and overall well-being.  Medical   Heart Disease Risk Reduction Clinical staff conducted group or individual video education with verbal and written material and guidebook.  Patient learns our heart is our most vital organ as it circulates oxygen, nutrients, white blood cells, and hormones throughout the entire body, and carries waste away. Data supports a plant-based eating plan like the Pritikin Program for its effectiveness in slowing progression of and reversing heart disease. The video provides a number of recommendations to address heart disease.   Metabolic Syndrome and Belly Fat  Clinical staff conducted group or individual video education with verbal and written material and guidebook.  Patient learns what metabolic syndrome is, how it leads to heart disease, and how one can reverse it and keep it from coming back. You have metabolic syndrome if you have 3 of the following 5 criteria: abdominal obesity, high blood pressure, high triglycerides, low HDL cholesterol, and high blood sugar.  Hypertension and Heart Disease Clinical staff conducted group or individual video education with verbal and written material and guidebook.  Patient learns that high blood pressure, or hypertension, is very common in the [place]. Hypertension is largely due to excessive salt intake, but other important risk factors include being overweight, physical inactivity, drinking too much alcohol, smoking, and not eating enough potassium from fruits and vegetables. High blood pressure is a leading risk factor for heart attack, stroke, congestive heart failure, dementia, kidney failure, and premature death. Long-term effects of excessive salt intake include stiffening of the arteries and thickening of heart muscle and organ damage. Recommendations include ways to reduce hypertension  and the risk of heart disease.  Diseases of Our Time - Focusing on Diabetes Clinical staff conducted group or individual video education with verbal and written material and guidebook.  Patient learns why the best way to stop diseases of our time is prevention, through food and other lifestyle changes. Medicine (such as prescription pills and surgeries) is often only a Band-Aid on the problem, not a long-term solution. Most common diseases of our time include obesity, type 2 diabetes, hypertension, heart disease, and cancer. The Pritikin Program is recommended and has been proven to help reduce, reverse, and/or prevent the damaging effects of metabolic syndrome.  Nutrition   Overview of the Pritikin Eating Plan  Clinical staff conducted group or individual video education with verbal and written material and guidebook.  Patient learns about the Pritikin Eating Plan for disease risk reduction. The Pritikin Eating Plan emphasizes a wide variety of unrefined, minimally-processed carbohydrates, like fruits, vegetables, whole grains, and legumes. Go, Caution, and Stop food choices are explained. Plant-based and lean animal proteins are emphasized. Rationale provided for low sodium intake for blood pressure control, low added sugars for blood  sugar stabilization, and low added fats and oils for coronary artery disease risk reduction and weight management.  Calorie Density  Clinical staff conducted group or individual video education with verbal and written material and guidebook.  Patient learns about calorie density and how it impacts the Pritikin Eating Plan. Knowing the characteristics of the food you choose will help you decide whether those foods will lead to weight gain or weight loss, and whether you want to consume more or less of them. Weight loss is usually a side effect of the Pritikin Eating Plan because of its focus on low calorie-dense foods.  Label Reading  Clinical staff conducted group or  individual video education with verbal and written material and guidebook.  Patient learns about the Pritikin recommended label reading guidelines and corresponding recommendations regarding calorie density, added sugars, sodium content, and whole grains.  Dining Out - Part 1  Clinical staff conducted group or individual video education with verbal and written material and guidebook.  Patient learns that restaurant meals can be sabotaging because they can be so high in calories, fat, sodium, and/or sugar. Patient learns recommended strategies on how to positively address this and avoid unhealthy pitfalls.  Facts on Fats  Clinical staff conducted group or individual video education with verbal and written material and guidebook.  Patient learns that lifestyle modifications can be just as effective, if not more so, as many medications for lowering your risk of heart disease. A Pritikin lifestyle can help to reduce your risk of inflammation and atherosclerosis (cholesterol build-up, or plaque, in the artery walls). Lifestyle interventions such as dietary choices and physical activity address the cause of atherosclerosis. A review of the types of fats and their impact on blood cholesterol levels, along with dietary recommendations to reduce fat intake is also included.  Nutrition Action Plan  Clinical staff conducted group or individual video education with verbal and written material and guidebook.  Patient learns how to incorporate Pritikin recommendations into their lifestyle. Recommendations include planning and keeping personal health goals in mind as an important part of their success.  Healthy Mind-Set    Healthy Minds, Bodies, Hearts  Clinical staff conducted group or individual video education with verbal and written material and guidebook.  Patient learns how to identify when they are stressed. Video will discuss the impact of that stress, as well as the many benefits of stress management.  Patient will also be introduced to stress management techniques. The way we think, act, and feel has an impact on our hearts.  How Our Thoughts Can Heal Our Hearts  Clinical staff conducted group or individual video education with verbal and written material and guidebook.  Patient learns that negative thoughts can cause depression and anxiety. This can result in negative lifestyle behavior and serious health problems. Cognitive behavioral therapy is an effective method to help control our thoughts in order to change and improve our emotional outlook.  Additional Videos:  Exercise    Improving Performance  Clinical staff conducted group or individual video education with verbal and written material and guidebook.  Patient learns to use a non-linear approach by alternating intensity levels and lengths of time spent exercising to help burn more calories and lose more body fat. Cardiovascular exercise helps improve heart health, metabolism, hormonal balance, blood sugar control, and recovery from fatigue. Resistance training improves strength, endurance, balance, coordination, reaction time, metabolism, and muscle mass. Flexibility exercise improves circulation, posture, and balance. Seek guidance from your physician and exercise physiologist before implementing  an exercise routine and learn your capabilities and proper form for all exercise.  Introduction to Yoga  Clinical staff conducted group or individual video education with verbal and written material and guidebook.  Patient learns about yoga, a discipline of the coming together of mind, breath, and body. The benefits of yoga include improved flexibility, improved range of motion, better posture and core strength, increased lung function, weight loss, and positive self-image. Yoga's heart health benefits include lowered blood pressure, healthier heart rate, decreased cholesterol and triglyceride levels, improved immune function, and reduced stress.  Seek guidance from your physician and exercise physiologist before implementing an exercise routine and learn your capabilities and proper form for all exercise.  Medical   Aging: Enhancing Your Quality of Life  Clinical staff conducted group or individual video education with verbal and written material and guidebook.  Patient learns key strategies and recommendations to stay in good physical health and enhance quality of life, such as prevention strategies, having an advocate, securing a Health Care Proxy and Power of Attorney, and keeping a list of medications and system for tracking them. It also discusses how to avoid risk for bone loss.  Biology of Weight Control  Clinical staff conducted group or individual video education with verbal and written material and guidebook.  Patient learns that weight gain occurs because we consume more calories than we burn (eating more, moving less). Even if your body weight is normal, you may have higher ratios of fat compared to muscle mass. Too much body fat puts you at increased risk for cardiovascular disease, heart attack, stroke, type 2 diabetes, and obesity-related cancers. In addition to exercise, following the Pritikin Eating Plan can help reduce your risk.  Decoding Lab Results  Clinical staff conducted group or individual video education with verbal and written material and guidebook.  Patient learns that lab test reflects one measurement whose values change over time and are influenced by many factors, including medication, stress, sleep, exercise, food, hydration, pre-existing medical conditions, and more. It is recommended to use the knowledge from this video to become more involved with your lab results and evaluate your numbers to speak with your doctor.   Diseases of Our Time - Overview  Clinical staff conducted group or individual video education with verbal and written material and guidebook.  Patient learns that according to the CDC, 50%  to 70% of chronic diseases (such as obesity, type 2 diabetes, elevated lipids, hypertension, and heart disease) are avoidable through lifestyle improvements including healthier food choices, listening to satiety cues, and increased physical activity.  Sleep Disorders Clinical staff conducted group or individual video education with verbal and written material and guidebook.  Patient learns how good quality and duration of sleep are important to overall health and well-being. Patient also learns about sleep disorders and how they impact health along with recommendations to address them, including discussing with a physician.  Nutrition  Dining Out - Part 2 Clinical staff conducted group or individual video education with verbal and written material and guidebook.  Patient learns how to plan ahead and communicate in order to maximize their dining experience in a healthy and nutritious manner. Included are recommended food choices based on the type of restaurant the patient is visiting.   Fueling a Banker conducted group or individual video education with verbal and written material and guidebook.  There is a strong connection between our food choices and our health. Diseases like obesity and type 2 diabetes are  very prevalent and are in large-part due to lifestyle choices. The Pritikin Eating Plan provides plenty of food and hunger-curbing satisfaction. It is easy to follow, affordable, and helps reduce health risks.  Menu Workshop  Clinical staff conducted group or individual video education with verbal and written material and guidebook.  Patient learns that restaurant meals can sabotage health goals because they are often packed with calories, fat, sodium, and sugar. Recommendations include strategies to plan ahead and to communicate with the manager, chef, or server to help order a healthier meal.  Planning Your Eating Strategy  Clinical staff conducted group or individual  video education with verbal and written material and guidebook.  Patient learns about the Pritikin Eating Plan and its benefit of reducing the risk of disease. The Pritikin Eating Plan does not focus on calories. Instead, it emphasizes high-quality, nutrient-rich foods. By knowing the characteristics of the foods, we choose, we can determine their calorie density and make informed decisions.  Targeting Your Nutrition Priorities  Clinical staff conducted group or individual video education with verbal and written material and guidebook.  Patient learns that lifestyle habits have a tremendous impact on disease risk and progression. This video provides eating and physical activity recommendations based on your personal health goals, such as reducing LDL cholesterol, losing weight, preventing or controlling type 2 diabetes, and reducing high blood pressure.  Vitamins and Minerals  Clinical staff conducted group or individual video education with verbal and written material and guidebook.  Patient learns different ways to obtain key vitamins and minerals, including through a recommended healthy diet. It is important to discuss all supplements you take with your doctor.   Healthy Mind-Set    Smoking Cessation  Clinical staff conducted group or individual video education with verbal and written material and guidebook.  Patient learns that cigarette smoking and tobacco addiction pose a serious health risk which affects millions of people. Stopping smoking will significantly reduce the risk of heart disease, lung disease, and many forms of cancer. Recommended strategies for quitting are covered, including working with your doctor to develop a successful plan.  Culinary   Becoming a Set designer conducted group or individual video education with verbal and written material and guidebook.  Patient learns that cooking at home can be healthy, cost-effective, quick, and puts them in control.  Keys to cooking healthy recipes will include looking at your recipe, assessing your equipment needs, planning ahead, making it simple, choosing cost-effective seasonal ingredients, and limiting the use of added fats, salts, and sugars.  Cooking - Breakfast and Snacks  Clinical staff conducted group or individual video education with verbal and written material and guidebook.  Patient learns how important breakfast is to satiety and nutrition through the entire day. Recommendations include key foods to eat during breakfast to help stabilize blood sugar levels and to prevent overeating at meals later in the day. Planning ahead is also a key component.  Cooking - Educational psychologist conducted group or individual video education with verbal and written material and guidebook.  Patient learns eating strategies to improve overall health, including an approach to cook more at home. Recommendations include thinking of animal protein as a side on your plate rather than center stage and focusing instead on lower calorie dense options like vegetables, fruits, whole grains, and plant-based proteins, such as beans. Making sauces in large quantities to freeze for later and leaving the skin on your vegetables are also recommended to maximize your  experience.  Cooking - Healthy Salads and Dressing Clinical staff conducted group or individual video education with verbal and written material and guidebook.  Patient learns that vegetables, fruits, whole grains, and legumes are the foundations of the Pritikin Eating Plan. Recommendations include how to incorporate each of these in flavorful and healthy salads, and how to create homemade salad dressings. Proper handling of ingredients is also covered. Cooking - Soups and [employer] - Soups and Desserts Clinical staff conducted group or individual video education with verbal and written material and guidebook.  Patient learns that Pritikin soups and  desserts make for easy, nutritious, and delicious snacks and meal components that are low in sodium, fat, sugar, and calorie density, while high in vitamins, minerals, and filling fiber. Recommendations include simple and healthy ideas for soups and desserts.   Overview     The Pritikin Solution Program Overview Clinical staff conducted group or individual video education with verbal and written material and guidebook.  Patient learns that the results of the Pritikin Program have been documented in more than 100 articles published in peer-reviewed journals, and the benefits include reducing risk factors for (and, in some cases, even reversing) high cholesterol, high blood pressure, type 2 diabetes, obesity, and more! An overview of the three key pillars of the Pritikin Program will be covered: eating well, doing regular exercise, and having a healthy mind-set.  WORKSHOPS  Exercise: Exercise Basics: Building Your Action Plan Clinical staff led group instruction and group discussion with PowerPoint presentation and patient guidebook. To enhance the learning environment the use of posters, models and videos may be added. At the conclusion of this workshop, patients will comprehend the difference between physical activity and exercise, as well as the benefits of incorporating both, into their routine. Patients will understand the FITT (Frequency, Intensity, Time, and Type) principle and how to use it to build an exercise action plan. In addition, safety concerns and other considerations for exercise and cardiac rehab will be addressed by the presenter. The purpose of this lesson is to promote a comprehensive and effective weekly exercise routine in order to improve patients' overall level of fitness.   Managing Heart Disease: Your Path to a Healthier Heart Clinical staff led group instruction and group discussion with PowerPoint presentation and patient guidebook. To enhance the learning environment  the use of posters, models and videos may be added.At the conclusion of this workshop, patients will understand the anatomy and physiology of the heart. Additionally, they will understand how Pritikin's three pillars impact the risk factors, the progression, and the management of heart disease.  The purpose of this lesson is to provide a high-level overview of the heart, heart disease, and how the Pritikin lifestyle positively impacts risk factors.  Exercise Biomechanics Clinical staff led group instruction and group discussion with PowerPoint presentation and patient guidebook. To enhance the learning environment the use of posters, models and videos may be added. Patients will learn how the structural parts of their bodies function and how these functions impact their daily activities, movement, and exercise. Patients will learn how to promote a neutral spine, learn how to manage pain, and identify ways to improve their physical movement in order to promote healthy living. The purpose of this lesson is to expose patients to common physical limitations that impact physical activity. Participants will learn practical ways to adapt and manage aches and pains, and to minimize their effect on regular exercise. Patients will learn how to maintain good posture while sitting,  walking, and lifting.  Balance Training and Fall Prevention  Clinical staff led group instruction and group discussion with PowerPoint presentation and patient guidebook. To enhance the learning environment the use of posters, models and videos may be added. At the conclusion of this workshop, patients will understand the importance of their sensorimotor skills (vision, proprioception, and the vestibular system) in maintaining their ability to balance as they age. Patients will apply a variety of balancing exercises that are appropriate for their current level of function. Patients will understand the common causes for  poor balance, possible solutions to these problems, and ways to modify their physical environment in order to minimize their fall risk. The purpose of this lesson is to teach patients about the importance of maintaining balance as they age and ways to minimize their risk of falling.  WORKSHOPS   Nutrition:  Fueling a Ship broker led group instruction and group discussion with PowerPoint presentation and patient guidebook. To enhance the learning environment the use of posters, models and videos may be added. Patients will review the foundational principles of the Pritikin Eating Plan and understand what constitutes a serving size in each of the food groups. Patients will also learn Pritikin-friendly foods that are better choices when away from home and review make-ahead meal and snack options. Calorie density will be reviewed and applied to three nutrition priorities: weight maintenance, weight loss, and weight gain. The purpose of this lesson is to reinforce (in a group setting) the key concepts around what patients are recommended to eat and how to apply these guidelines when away from home by planning and selecting Pritikin-friendly options. Patients will understand how calorie density may be adjusted for different weight management goals.  Mindful Eating  Clinical staff led group instruction and group discussion with PowerPoint presentation and patient guidebook. To enhance the learning environment the use of posters, models and videos may be added. Patients will briefly review the concepts of the Pritikin Eating Plan and the importance of low-calorie dense foods. The concept of mindful eating will be introduced as well as the importance of paying attention to internal hunger signals. Triggers for non-hunger eating and techniques for dealing with triggers will be explored. The purpose of this lesson is to provide patients with the opportunity to review the basic principles of the  Pritikin Eating Plan, discuss the value of eating mindfully and how to measure internal cues of hunger and fullness using the Hunger Scale. Patients will also discuss reasons for non-hunger eating and learn strategies to use for controlling emotional eating.  Targeting Your Nutrition Priorities Clinical staff led group instruction and group discussion with PowerPoint presentation and patient guidebook. To enhance the learning environment the use of posters, models and videos may be added. Patients will learn how to determine their genetic susceptibility to disease by reviewing their family history. Patients will gain insight into the importance of diet as part of an overall healthy lifestyle in mitigating the impact of genetics and other environmental insults. The purpose of this lesson is to provide patients with the opportunity to assess their personal nutrition priorities by looking at their family history, their own health history and current risk factors. Patients will also be able to discuss ways of prioritizing and modifying the Pritikin Eating Plan for their highest risk areas  Menu  Clinical staff led group instruction and group discussion with PowerPoint presentation and patient guidebook. To enhance the learning environment the use of posters, models and videos may be added.  Using menus brought in from [employer], or printed from [employer], patients will apply the Pritikin dining out guidelines that were presented in the [employer] video. Patients will also be able to practice these guidelines in a variety of provided scenarios. The purpose of this lesson is to provide patients with the opportunity to practice hands-on learning of the Pritikin Dining Out guidelines with actual menus and practice scenarios.  Label Reading Clinical staff led group instruction and group discussion with PowerPoint presentation and patient guidebook. To enhance the learning environment  the use of posters, models and videos may be added. Patients will review and discuss the Pritikin label reading guidelines presented in Pritikin's Label Reading Educational series video. Using fool labels brought in from local grocery stores and markets, patients will apply the label reading guidelines and determine if the packaged food meet the Pritikin guidelines. The purpose of this lesson is to provide patients with the opportunity to review, discuss, and practice hands-on learning of the Pritikin Label Reading guidelines with actual packaged food labels. Cooking School  Pritikin's [employer] are designed to teach patients ways to prepare quick, simple, and affordable recipes at home. The importance of nutrition's role in chronic disease risk reduction is reflected in its emphasis in the overall Pritikin program. By learning how to prepare essential core Pritikin Eating Plan recipes, patients will increase control over what they eat; be able to customize the flavor of foods without the use of added salt, sugar, or fat; and improve the quality of the food they consume. By learning a set of core recipes which are easily assembled, quickly prepared, and affordable, patients are more likely to prepare more healthy foods at home. These workshops focus on convenient breakfasts, simple entres, side dishes, and desserts which can be prepared with minimal effort and are consistent with nutrition recommendations for cardiovascular risk reduction. Cooking [employer] are taught by a Armed forces logistics/support/administrative officer (RD) who has been trained by the [employer]. The chef or RD has a clear understanding of the importance of minimizing - if not completely eliminating - added fat, sugar, and sodium in recipes. Throughout the series of Cooking School Workshop sessions, patients will learn about healthy ingredients and efficient methods of cooking to build confidence in their capability to  prepare    Cooking School weekly topics:  Adding Flavor- Sodium-Free  Fast and Healthy Breakfasts  Powerhouse Plant-Based Proteins  Satisfying Salads and Dressings  Simple Sides and Sauces  International Cuisine-Spotlight on the [employer] Zones  Delicious Desserts  Savory Soups  [employer] - Meals in a Astronomer Appetizers and Snacks  Comforting Weekend Breakfasts  One-Pot Wonders   Fast Evening Meals  Landscape architect Your Pritikin Plate  WORKSHOPS   Healthy Mindset (Psychosocial):  Focused Goals, Sustainable Changes Clinical staff led group instruction and group discussion with PowerPoint presentation and patient guidebook. To enhance the learning environment the use of posters, models and videos may be added. Patients will be able to apply effective goal setting strategies to establish at least one personal goal, and then take consistent, meaningful action toward that goal. They will learn to identify common barriers to achieving personal goals and develop strategies to overcome them. Patients will also gain an understanding of how our mind-set can impact our ability to achieve goals and the importance of cultivating a positive and growth-oriented mind-set. The purpose of this lesson is to provide patients with a deeper understanding  of how to set and achieve personal goals, as well as the tools and strategies needed to overcome common obstacles which may arise along the way.  From Head to Heart: The Power of a Healthy Outlook  Clinical staff led group instruction and group discussion with PowerPoint presentation and patient guidebook. To enhance the learning environment the use of posters, models and videos may be added. Patients will be able to recognize and describe the impact of emotions and mood on physical health. They will discover the importance of self-care and explore self-care practices which may work for them. Patients will also learn how to utilize  the 4 C's to cultivate a healthier outlook and better manage stress and challenges. The purpose of this lesson is to demonstrate to patients how a healthy outlook is an essential part of maintaining good health, especially as they continue their cardiac rehab journey.  Healthy Sleep for a Healthy Heart Clinical staff led group instruction and group discussion with PowerPoint presentation and patient guidebook. To enhance the learning environment the use of posters, models and videos may be added. At the conclusion of this workshop, patients will be able to demonstrate knowledge of the importance of sleep to overall health, well-being, and quality of life. They will understand the symptoms of, and treatments for, common sleep disorders. Patients will also be able to identify daytime and nighttime behaviors which impact sleep, and they will be able to apply these tools to help manage sleep-related challenges. The purpose of this lesson is to provide patients with a general overview of sleep and outline the importance of quality sleep. Patients will learn about a few of the most common sleep disorders. Patients will also be introduced to the concept of "sleep hygiene," and discover ways to self-manage certain sleeping problems through simple daily behavior changes. Finally, the workshop will motivate patients by clarifying the links between quality sleep and their goals of heart-healthy living.   Recognizing and Reducing Stress Clinical staff led group instruction and group discussion with PowerPoint presentation and patient guidebook. To enhance the learning environment the use of posters, models and videos may be added. At the conclusion of this workshop, patients will be able to understand the types of stress reactions, differentiate between acute and chronic stress, and recognize the impact that chronic stress has on their health. They will also be able to apply different coping mechanisms, such as reframing  negative self-talk. Patients will have the opportunity to practice a variety of stress management techniques, such as deep abdominal breathing, progressive muscle relaxation, and/or guided imagery.  The purpose of this lesson is to educate patients on the role of stress in their lives and to provide healthy techniques for coping with it.  Learning Barriers/Preferences:  Learning Barriers/Preferences - 02/24/23 1434       Learning Barriers/Preferences   Learning Barriers Hearing   bilateral hearing aids   Learning Preferences Written Material;Computer/Internet             Education Topics:  Knowledge Questionnaire Score:  Knowledge Questionnaire Score - 02/24/23 1433       Knowledge Questionnaire Score   Pre Score 16/24             Core Components/Risk Factors/Patient Goals at Admission:  Personal Goals and Risk Factors at Admission - 02/24/23 1433       Core Components/Risk Factors/Patient Goals on Admission    Weight Management Weight Maintenance;Yes    Expected Outcomes Long Term: Adherence to nutrition and physical activity/exercise  program aimed toward attainment of established weight goal;Weight Maintenance: Understanding of the daily nutrition guidelines, which includes 25-35% calories from fat, 7% or less cal from saturated fats, less than 200mg  cholesterol, less than 1.5gm of sodium, & 5 or more servings of fruits and vegetables daily;Understanding recommendations for meals to include 15-35% energy as protein, 25-35% energy from fat, 35-60% energy from carbohydrates, less than 200mg  of dietary cholesterol, 20-35 gm of total fiber daily;Understanding of distribution of calorie intake throughout the day with the consumption of 4-5 meals/snacks    Heart Failure Yes    Intervention Provide a combined exercise and nutrition program that is supplemented with education, support and counseling about heart failure. Directed toward relieving symptoms such as shortness of breath,  decreased exercise tolerance, and extremity edema.    Expected Outcomes Improve functional capacity of life;Short term: Attendance in program 2-3 days a week with increased exercise capacity. Reported lower sodium intake. Reported increased fruit and vegetable intake. Reports medication compliance.;Short term: Daily weights obtained and reported for increase. Utilizing diuretic protocols set by physician.;Long term: Adoption of self-care skills and reduction of barriers for early signs and symptoms recognition and intervention leading to self-care maintenance.    Hypertension Yes    Intervention Provide education on lifestyle modifcations including regular physical activity/exercise, weight management, moderate sodium restriction and increased consumption of fresh fruit, vegetables, and low fat dairy, alcohol moderation, and smoking cessation.;Monitor prescription use compliance.    Expected Outcomes Short Term: Continued assessment and intervention until BP is < 140/70mm HG in hypertensive participants. < 130/18mm HG in hypertensive participants with diabetes, heart failure or chronic kidney disease.;Long Term: Maintenance of blood pressure at goal levels.    Lipids Yes    Intervention Provide education and support for participant on nutrition & aerobic/resistive exercise along with prescribed medications to achieve LDL 70mg , HDL >40mg .    Expected Outcomes Short Term: Participant states understanding of desired cholesterol values and is compliant with medications prescribed. Participant is following exercise prescription and nutrition guidelines.;Long Term: Cholesterol controlled with medications as prescribed, with individualized exercise RX and with personalized nutrition plan. Value goals: LDL < 70mg , HDL > 40 mg.             Core Components/Risk Factors/Patient Goals Review:    Core Components/Risk Factors/Patient Goals at Discharge (Final Review):    ITP Comments:  ITP Comments      Row Name 02/24/23 1047           ITP Comments Introduction to Pritikin Education Program/ Intensive Cardiac Rehab. Initial Orientation Packet reviewed with the patient.                Comments: Participant attended orientation for the cardiac rehabilitation program on  02/24/2023 to perform initial intake and exercise walk test. Patient introduced to the Pritikin Program education and orientation packet was reviewed. Completed 6-minute walk test, measurements, initial ITP, and exercise prescription. Vital signs stable. 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 Velora Mediate, MSN, RN.  Service time was from 10:00 to 12:04.    Marland Kitchen

## 2023-02-24 NOTE — Progress Notes (Signed)
Cardiac Rehab Medication Review   Does the patient  feel that his/her medications are working for him/her?  yes  Has the patient been experiencing any side effects to the medications prescribed?  no  Does the patient measure his/her own blood pressure or blood glucose at home?  no   Does the patient have any problems obtaining medications due to transportation or finances?   no  Understanding of regimen: good Understanding of indications: good Potential of compliance: good    Comments: Pt has a good understanding of her medications. No checking BP at home.    Lorin Picket 02/24/2023 10:18 AM

## 2023-02-26 DIAGNOSIS — M810 Age-related osteoporosis without current pathological fracture: Secondary | ICD-10-CM | POA: Diagnosis not present

## 2023-02-26 DIAGNOSIS — Z853 Personal history of malignant neoplasm of breast: Secondary | ICD-10-CM | POA: Diagnosis not present

## 2023-02-26 DIAGNOSIS — Z923 Personal history of irradiation: Secondary | ICD-10-CM | POA: Diagnosis not present

## 2023-03-02 ENCOUNTER — Encounter (HOSPITAL_COMMUNITY)
Admission: RE | Admit: 2023-03-02 | Discharge: 2023-03-02 | Disposition: A | Discharge status: Home / Self Care | Payer: Medicare Other | Source: Ambulatory Visit | Attending: Cardiology | Admitting: Cardiology

## 2023-03-02 DIAGNOSIS — I214 Non-ST elevation (NSTEMI) myocardial infarction: Secondary | ICD-10-CM | POA: Diagnosis not present

## 2023-03-02 DIAGNOSIS — Z955 Presence of coronary angioplasty implant and graft: Secondary | ICD-10-CM | POA: Diagnosis not present

## 2023-03-02 NOTE — Progress Notes (Signed)
Daily Session Note  Patient Details  Name: Marissa Carroll MRN: [id number] Date of Birth: 03-06-1928 Referring Provider:   Flowsheet Row INTENSIVE CARDIAC REHAB ORIENT from 02/24/2023 in [facility]  Referring Provider [name], MD       Encounter Date: 03/02/2023  Check In:  Session Check In - 03/02/23 1034       Check-In   Supervising physician immediately available to respond to emergencies [facility] MD immediately available    Physician(s) Carlos Levering NP    Location [facility]    Staff Present Velora Mediate, RN, MSN;David Makemson, MS, ACSM-CEP, CCRP, Exercise Physiologist;Lexus Shampine, RN, Doris Cheadle, MS, ACSM-CEP, Exercise Physiologist;Olinty Peggye Pitt, MS, ACSM-CEP, Exercise Physiologist;Jetta Walker BS, ACSM-CEP, Exercise Physiologist    Virtual Visit No    Medication changes reported     No    Fall or balance concerns reported    No    Tobacco Cessation No Change    Warm-up and Cool-down Performed as group-led instruction   CRP2 orientation today   Resistance Training Performed Yes    VAD Patient? No    PAD/SET Patient? No      Pain Assessment   Currently in Pain? No/denies    Pain Score 0-No pain    Multiple Pain Sites No             Capillary Blood Glucose: No results found for this or any previous visit (from the past 24 hour(s)).   Exercise Prescription Changes - 03/02/23 1018       Response to Exercise   Blood Pressure (Admit) 110/60    Blood Pressure (Exercise) 120/70    Blood Pressure (Exit) 110/64    Heart Rate (Admit) 64 bpm    Heart Rate (Exercise) 85 bpm    Heart Rate (Exit) 64 bpm    Rating of Perceived Exertion (Exercise) 13    Symptoms None    Comments Off to a good start with exercise.    Duration Continue with 30 min of aerobic exercise without signs/symptoms of physical distress.    Intensity THRR unchanged      Progression   Progression  Continue to progress workloads to maintain intensity without signs/symptoms of physical distress.    Average METs 2      Resistance Training   Training Prescription Yes    Weight 2 lbs    Reps 10-15    Time 10 Minutes      Interval Training   Interval Training No      Recumbant Bike   Level 1    RPM 65    Minutes 15    METs 2.2      NuStep   Level 2    SPM 68    Minutes 15    METs 1.9             Social History   Tobacco Use  Smoking Status Never  Smokeless Tobacco Never    Goals Met:  Exercise tolerated well No report of concerns or symptoms today Strength training completed today  Goals Unmet:  Not Applicable  Comments: Pt started cardiac rehab today.  Pt tolerated light exercise without difficulty. VSS, telemetry-Sinus rhythm, Bundle branch block asymptomatic.  Medication list reconciled. Pt denies barriers to medicaiton compliance.  PSYCHOSOCIAL ASSESSMENT:  PHQ-0. Pt exhibits positive coping skills, hopeful outlook with supportive family. No psychosocial needs identified at this time, no psychosocial interventions necessary.  Pt enjoys pickelball, spending time with friends,church activities, spending time with children and grandchildren .   Pt oriented to exercise equipment and routine.    Understanding verbalized.Thayer Headings RN BSN    Dr. Armanda Magic is Medical Director for Cardiac Rehab at [facility]

## 2023-03-03 NOTE — Progress Notes (Signed)
Cardiac Individual Treatment Plan  Patient Details  Name: Marissa Carroll MRN: [id number] Date of Birth: Oct 03, 1927 Referring Provider:   Flowsheet Row INTENSIVE CARDIAC REHAB ORIENT from 02/24/2023 in [facility]  Referring Provider [name], MD       Initial Encounter Date:  Flowsheet Row INTENSIVE CARDIAC REHAB ORIENT from 02/24/2023 in [facility]  Date 02/24/23       Visit Diagnosis: 11/14/22 Status post coronary artery stent placement  11/14/22 NSTEMI (non-ST elevated myocardial infarction) [facility])  Patient's Home Medications on Admission:  Current Outpatient Medications:    apixaban (ELIQUIS) 5 MG TABS tablet, Take 1 tablet (5 mg total) by mouth 2 (two) times daily., Disp: 60 tablet, Rfl: 11   atorvastatin (LIPITOR) 40 MG tablet, Take 1 tablet (40 mg total) by mouth daily., Disp: 90 tablet, Rfl: 3   carvedilol (COREG) 3.125 MG tablet, Take 1 tablet (3.125 mg total) by mouth 2 (two) times daily with a meal., Disp: 60 tablet, Rfl: 11   clopidogrel (PLAVIX) 75 MG tablet, Take 1 tablet (75 mg total) by mouth daily., Disp: 30 tablet, Rfl: 11   dapagliflozin propanediol (FARXIGA) 10 MG TABS tablet, Take 1 tablet (10 mg total) by mouth daily before breakfast., Disp: 30 tablet, Rfl: 11   diazepam (VALIUM) 5 MG tablet, As needed, Disp: , Rfl:    furosemide (LASIX) 40 MG tablet, Take 1 tablet (40 mg total) by mouth 2 (two) times daily., Disp: 90 tablet, Rfl: 3   Multiple Vitamins-Minerals (EYE VITAMINS PO), Take 1 tablet by mouth daily. , Disp: , Rfl:    nitroGLYCERIN (NITROSTAT) 0.4 MG SL tablet, Place 1 tablet (0.4 mg total) under the tongue every 5 (five) minutes as needed for chest pain., Disp: 90 tablet, Rfl: 3   spironolactone (ALDACTONE) 25 MG tablet, Take 1 tablet (25 mg total) by mouth daily., Disp: 90 tablet, Rfl: 3   valsartan (DIOVAN) 40 MG tablet, Take 0.5  tablets (20 mg total) by mouth 2 (two) times daily., Disp: 90 tablet, Rfl: 3  Past Medical History: Past Medical History:  Diagnosis Date   CHF (congestive heart failure) (HCC)    Coronary artery disease    History of breast cancer    Hyperlipidemia    Hypertension     Tobacco Use: Social History   Tobacco Use  Smoking Status Never  Smokeless Tobacco Never    Labs: Review Flowsheet       Latest Ref Rng & Units 11/27/2022 02/13/2023  Labs for ITP Cardiac and Pulmonary Rehab  Cholestrol 0 - 200 mg/dL 409  811   LDL (calc) 0 - 99 mg/dL 69  50   HDL-C >91 mg/dL 36  39   Trlycerides <478 mg/dL 94  87     Capillary Blood Glucose: No results found for: "GLUCAP"   Exercise Target Goals: Exercise Program Goal: Individual exercise prescription set using results from initial 6 min walk test and THRR while considering  patient's activity barriers and safety.   Exercise Prescription Goal: Initial exercise prescription builds to 30-45 minutes a day of aerobic activity, 2-3 days per week.  Home exercise guidelines will be given to patient during program as part of exercise prescription that the participant will acknowledge.  Activity Barriers & Risk Stratification:  Activity Barriers & Cardiac Risk Stratification - 02/24/23 1427       Activity Barriers & Cardiac Risk Stratification  Activity Barriers Joint Problems;Deconditioning;Muscular Weakness;Decreased Ventricular Function;Balance Concerns;Arthritis    Cardiac Risk Stratification High             6 Minute Walk:  6 Minute Walk     Row Name 02/24/23 1143         6 Minute Walk   Phase Initial     Distance 1274 feet     Walk Time 6 minutes     # of Rest Breaks 0     MPH 2.41     METS 1.7     RPE 11     Perceived Dyspnea  0     VO2 Peak 5.8     Symptoms Yes (comment)     Comments Bilateral Hip pain 3/10     Resting HR 64 bpm     Resting BP 88/50     Resting Oxygen Saturation  99 %     Exercise Oxygen  Saturation  during 6 min walk 100 %     Max Ex. HR 83 bpm     Max Ex. BP 126/70     2 Minute Post BP 110/68              Oxygen Initial Assessment:   Oxygen Re-Evaluation:   Oxygen Discharge (Final Oxygen Re-Evaluation):   Initial Exercise Prescription:  Initial Exercise Prescription - 02/24/23 1400       Date of Initial Exercise RX and Referring Provider   Date 02/24/23    Referring Provider [name], MD    Expected Discharge Date 05/08/23      Recumbant Bike   Level 1    RPM 60    Watts 25    Minutes 15    METs 1.7      NuStep   Level 2    SPM 75    Minutes 15    METs 1.7      Prescription Details   Frequency (times per week) 3    Duration Progress to 30 minutes of continuous aerobic without signs/symptoms of physical distress      Intensity   THRR 40-80% of Max Heartrate 51-102    Ratings of Perceived Exertion 11-13    Perceived Dyspnea 0-4      Progression   Progression Continue progressive overload as per policy without signs/symptoms or physical distress.      Resistance Training   Training Prescription Yes    Weight 2 lbs    Reps 10-15             Perform Capillary Blood Glucose checks as needed.  Exercise Prescription Changes:   Exercise Prescription Changes     Row Name 03/02/23 1018             Response to Exercise   Blood Pressure (Admit) 110/60       Blood Pressure (Exercise) 120/70       Blood Pressure (Exit) 110/64       Heart Rate (Admit) 64 bpm       Heart Rate (Exercise) 85 bpm       Heart Rate (Exit) 64 bpm       Rating of Perceived Exertion (Exercise) 13       Symptoms None       Comments Off to a good start with exercise.       Duration Continue with 30 min of aerobic exercise without signs/symptoms of physical distress.       Intensity THRR unchanged  Progression   Progression Continue to progress workloads to maintain intensity without signs/symptoms of physical distress.       Average METs 2          Resistance Training   Training Prescription Yes       Weight 2 lbs       Reps 10-15       Time 10 Minutes         Interval Training   Interval Training No         Recumbant Bike   Level 1       RPM 65       Minutes 15       METs 2.2         NuStep   Level 2       SPM 68       Minutes 15       METs 1.9                Exercise Comments:   Exercise Comments     Row Name 03/02/23 1130           Exercise Comments Patient tolerated low intensity exercise well without symptoms.                Exercise Goals and Review:   Exercise Goals     Row Name 02/24/23 1429             Exercise Goals   Increase Physical Activity Yes       Intervention Provide advice, education, support and counseling about physical activity/exercise needs.;Develop an individualized exercise prescription for aerobic and resistive training based on initial evaluation findings, risk stratification, comorbidities and participant's personal goals.       Expected Outcomes Short Term: Attend rehab on a regular basis to increase amount of physical activity.;Long Term: Add in home exercise to make exercise part of routine and to increase amount of physical activity.;Long Term: Exercising regularly at least 3-5 days a week.       Increase Strength and Stamina Yes       Intervention Provide advice, education, support and counseling about physical activity/exercise needs.;Develop an individualized exercise prescription for aerobic and resistive training based on initial evaluation findings, risk stratification, comorbidities and participant's personal goals.       Expected Outcomes Short Term: Increase workloads from initial exercise prescription for resistance, speed, and METs.;Short Term: Perform resistance training exercises routinely during rehab and add in resistance training at home;Long Term: Improve cardiorespiratory fitness, muscular endurance and strength as measured by increased METs  and functional capacity ( )       Able to understand and use rate of perceived exertion (RPE) scale Yes       Intervention Provide education and explanation on how to use RPE scale       Expected Outcomes Short Term: Able to use RPE daily in rehab to express subjective intensity level;Long Term:  Able to use RPE to guide intensity level when exercising independently       Knowledge and understanding of Target Heart Rate Range (THRR) Yes       Intervention Provide education and explanation of THRR including how the numbers were predicted and where they are located for reference       Expected Outcomes Short Term: Able to state/look up THRR;Short Term: Able to use daily as guideline for intensity in rehab;Long Term: Able to use THRR to govern intensity when exercising independently  Understanding of Exercise Prescription Yes       Intervention Provide education, explanation, and written materials on patient's individual exercise prescription       Expected Outcomes Short Term: Able to explain program exercise prescription;Long Term: Able to explain home exercise prescription to exercise independently                Exercise Goals Re-Evaluation :  Exercise Goals Re-Evaluation     Row Name 03/02/23 1645             Exercise Goal Re-Evaluation   Exercise Goals Review Increase Physical Activity;Able to understand and use rate of perceived exertion (RPE) scale;Increase Strength and Stamina       Comments Patient able to understand and use RPE scale appropriately.       Expected Outcomes Progress workloads as tolerated to help increase strength and stamina.                Discharge Exercise Prescription (Final Exercise Prescription Changes):  Exercise Prescription Changes - 03/02/23 1018       Response to Exercise   Blood Pressure (Admit) 110/60    Blood Pressure (Exercise) 120/70    Blood Pressure (Exit) 110/64    Heart Rate (Admit) 64 bpm    Heart Rate (Exercise) 85  bpm    Heart Rate (Exit) 64 bpm    Rating of Perceived Exertion (Exercise) 13    Symptoms None    Comments Off to a good start with exercise.    Duration Continue with 30 min of aerobic exercise without signs/symptoms of physical distress.    Intensity THRR unchanged      Progression   Progression Continue to progress workloads to maintain intensity without signs/symptoms of physical distress.    Average METs 2      Resistance Training   Training Prescription Yes    Weight 2 lbs    Reps 10-15    Time 10 Minutes      Interval Training   Interval Training No      Recumbant Bike   Level 1    RPM 65    Minutes 15    METs 2.2      NuStep   Level 2    SPM 68    Minutes 15    METs 1.9             Nutrition:  Target Goals: Understanding of nutrition guidelines, daily intake of sodium 1500mg , cholesterol 200mg , calories 30% from fat and 7% or less from saturated fats, daily to have 5 or more servings of fruits and vegetables.  Biometrics:  Pre Biometrics - 02/24/23 1025       Pre Biometrics   Waist Circumference 35 inches    Hip Circumference 37.5 inches    Waist to Hip Ratio 0.93 %    Triceps Skinfold 11 mm    % Body Fat 33.4 %    Grip Strength 17 kg    Flexibility 16.75 in    Single Leg Stand 2.37 seconds              Nutrition Therapy Plan and Nutrition Goals:  Nutrition Therapy & Goals - 03/02/23 1113       Nutrition Therapy   Diet Heart healthy diet    Drug/Food Interactions Statins/Certain Fruits      Personal Nutrition Goals   Nutrition Goal Patient to identify strategies for reducing cardiovascular risk by attending the Pritikin education and nutrition series weekly.  Personal Goal #2 Patient to improve diet quality by using the plate method as a guide for meal planning to include lean protein/plant protein, fruits, vegetables, whole grains, nonfat dairy as part of a well-balanced diet.    Personal Goal #3 Patient to reduce sodium to  1500mg  per day.    Comments Krissa reports making efforts to reduce saturated fat intake and be mindful of sodium intake 2500mg  per day at this time. She is hopeful to getting back to playing tennis regularly. Patient will benefit from participation in intensive cardiac rehab for nutrition, exercise, and lifestyle modification.      Intervention Plan   Intervention Prescribe, educate and counsel regarding individualized specific dietary modifications aiming towards targeted core components such as weight, hypertension, lipid management, diabetes, heart failure and other comorbidities.;Nutrition handout(s) given to patient.    Expected Outcomes Short Term Goal: Understand basic principles of dietary content, such as calories, fat, sodium, cholesterol and nutrients.;Long Term Goal: Adherence to prescribed nutrition plan.             Nutrition Assessments:  Nutrition Assessments - 03/02/23 1427       Rate Your Plate Scores   Pre Score 70            MEDIFICTS Score Key: ?70 Need to make dietary changes  40-70 Heart Healthy Diet ? 40 Therapeutic Level Cholesterol Diet   Flowsheet Row INTENSIVE CARDIAC REHAB from 03/02/2023 in [facility]  Picture Your Plate Total Score on Admission 70      Picture Your Plate Scores: <16 Unhealthy dietary pattern with much room for improvement. 41-50 Dietary pattern unlikely to meet recommendations for good health and room for improvement. 51-60 More healthful dietary pattern, with some room for improvement.  >60 Healthy dietary pattern, although there may be some specific behaviors that could be improved.    Nutrition Goals Re-Evaluation:  Nutrition Goals Re-Evaluation     Row Name 03/02/23 1113             Goals   Current Weight 132 lb 0.9 oz (59.9 kg)       Comment HDL 39, GFR 40, CR 1.26, BNP 331       Expected Outcome Ishrat reports making efforts to reduce saturated fat  intake and be mindful of sodium intake 2500mg  per day at this time. She is hopeful to getting back to playing tennis regularly. Patient will benefit from participation in intensive cardiac rehab for nutrition, exercise, and lifestyle modification.                Nutrition Goals Re-Evaluation:  Nutrition Goals Re-Evaluation     Row Name 03/02/23 1113             Goals   Current Weight 132 lb 0.9 oz (59.9 kg)       Comment HDL 39, GFR 40, CR 1.26, BNP 331       Expected Outcome Denae reports making efforts to reduce saturated fat intake and be mindful of sodium intake 2500mg  per day at this time. She is hopeful to getting back to playing tennis regularly. Patient will benefit from participation in intensive cardiac rehab for nutrition, exercise, and lifestyle modification.                Nutrition Goals Discharge (Final Nutrition Goals Re-Evaluation):  Nutrition Goals Re-Evaluation - 03/02/23 1113       Goals   Current Weight 132 lb 0.9  oz (59.9 kg)    Comment HDL 39, GFR 40, CR 1.26, BNP 331    Expected Outcome Aneissa reports making efforts to reduce saturated fat intake and be mindful of sodium intake 2500mg  per day at this time. She is hopeful to getting back to playing tennis regularly. Patient will benefit from participation in intensive cardiac rehab for nutrition, exercise, and lifestyle modification.             Psychosocial: Target Goals: Acknowledge presence or absence of significant depression and/or stress, maximize coping skills, provide positive support system. Participant is able to verbalize types and ability to use techniques and skills needed for reducing stress and depression.  Initial Review & Psychosocial Screening:  Initial Psych Review & Screening - 02/24/23 1431       Initial Review   Current issues with None Identified      Family Dynamics   Good Support System? Yes   Has daughter for support     Barriers   Psychosocial barriers to  participate in program There are no identifiable barriers or psychosocial needs.      Screening Interventions   Interventions Encouraged to exercise             Quality of Life Scores:  Quality of Life - 02/24/23 1432       Quality of Life   Select Quality of Life      Quality of Life Scores   Health/Function Pre 23.38 %    Socioeconomic Pre 30 %    Psych/Spiritual Pre 29.64 %    Family Pre 30 %    GLOBAL Pre 29.19 %            Scores of 19 and below usually indicate a poorer quality of life in these areas.  A difference of  2-3 points is a clinically meaningful difference.  A difference of 2-3 points in the total score of the Quality of Life Index has been associated with significant improvement in overall quality of life, self-image, physical symptoms, and general health in studies assessing change in quality of life.  PHQ-9: Review Flowsheet       02/24/2023  Depression screen PHQ 2/9  Decreased Interest 0  Down, Depressed, Hopeless 0  PHQ - 2 Score 0  Altered sleeping 0  Tired, decreased energy 0  Change in appetite 0  Feeling bad or failure about yourself  0  Trouble concentrating 0  Moving slowly or fidgety/restless 0  Suicidal thoughts 0  PHQ-9 Score 0  Difficult doing work/chores Not difficult at all   Interpretation of Total Score  Total Score Depression Severity:  1-4 = Minimal depression, 5-9 = Mild depression, 10-14 = Moderate depression, 15-19 = Moderately severe depression, 20-27 = Severe depression   Psychosocial Evaluation and Intervention:   Psychosocial Re-Evaluation:  Psychosocial Re-Evaluation     Row Name 03/02/23 1658             Psychosocial Re-Evaluation   Current issues with None Identified       Interventions Encouraged to attend Cardiac Rehabilitation for the exercise       Continue Psychosocial Services  No Follow up required                Psychosocial Discharge (Final Psychosocial Re-Evaluation):  Psychosocial  Re-Evaluation - 03/02/23 1658       Psychosocial Re-Evaluation   Current issues with None Identified    Interventions Encouraged to attend Cardiac Rehabilitation for the exercise  Continue Psychosocial Services  No Follow up required             Vocational Rehabilitation: Provide vocational rehab assistance to qualifying candidates.   Vocational Rehab Evaluation & Intervention:  Vocational Rehab - 02/24/23 1434       Initial Vocational Rehab Evaluation & Intervention   Assessment shows need for Vocational Rehabilitation No   Pt is retired            Education: Education Goals: Education classes will be provided on a weekly basis, covering required topics. Participant will state understanding/return demonstration of topics presented.    Education     Row Name 03/02/23 1300     Education   Cardiac Education Topics Pritikin   Select Workshops     Workshops   Educator Exercise Physiologist   Select Exercise   Exercise Workshop Location manager and Fall Prevention   Instruction Review Code 1- Verbalizes Understanding   Class Start Time 1146   Class Stop Time 1226   Class Time Calculation (min) 40 min            Core Videos: Exercise    Move It!  Clinical staff conducted group or individual video education with verbal and written material and guidebook.  Patient learns the recommended Pritikin exercise program. Exercise with the goal of living a long, healthy life. Some of the health benefits of exercise include controlled diabetes, healthier blood pressure levels, improved cholesterol levels, improved heart and lung capacity, improved sleep, and better body composition. Everyone should speak with their doctor before starting or changing an exercise routine.  Biomechanical Limitations Clinical staff conducted group or individual video education with verbal and written material and guidebook.  Patient learns how biomechanical limitations can impact exercise  and how we can mitigate and possibly overcome limitations to have an impactful and balanced exercise routine.  Body Composition Clinical staff conducted group or individual video education with verbal and written material and guidebook.  Patient learns that body composition (ratio of muscle mass to fat mass) is a key component to assessing overall fitness, rather than body weight alone. Increased fat mass, especially visceral belly fat, can put [place] at increased risk for metabolic syndrome, type 2 diabetes, heart disease, and even death. It is recommended to combine diet and exercise (cardiovascular and resistance training) to improve your body composition. Seek guidance from your physician and exercise physiologist before implementing an exercise routine.  Exercise Action Plan Clinical staff conducted group or individual video education with verbal and written material and guidebook.  Patient learns the recommended strategies to achieve and enjoy long-term exercise adherence, including variety, self-motivation, self-efficacy, and positive decision making. Benefits of exercise include fitness, good health, weight management, more energy, better sleep, less stress, and overall well-being.  Medical   Heart Disease Risk Reduction Clinical staff conducted group or individual video education with verbal and written material and guidebook.  Patient learns our heart is our most vital organ as it circulates oxygen, nutrients, white blood cells, and hormones throughout the entire body, and carries waste away. Data supports a plant-based eating plan like the Pritikin Program for its effectiveness in slowing progression of and reversing heart disease. The video provides a number of recommendations to address heart disease.   Metabolic Syndrome and Belly Fat  Clinical staff conducted group or individual video education with verbal and written material and guidebook.  Patient learns what metabolic syndrome is, how  it leads to heart disease, and how one can reverse  it and keep it from coming back. You have metabolic syndrome if you have 3 of the following 5 criteria: abdominal obesity, high blood pressure, high triglycerides, low HDL cholesterol, and high blood sugar.  Hypertension and Heart Disease Clinical staff conducted group or individual video education with verbal and written material and guidebook.  Patient learns that high blood pressure, or hypertension, is very common in the [place]. Hypertension is largely due to excessive salt intake, but other important risk factors include being overweight, physical inactivity, drinking too much alcohol, smoking, and not eating enough potassium from fruits and vegetables. High blood pressure is a leading risk factor for heart attack, stroke, congestive heart failure, dementia, kidney failure, and premature death. Long-term effects of excessive salt intake include stiffening of the arteries and thickening of heart muscle and organ damage. Recommendations include ways to reduce hypertension and the risk of heart disease.  Diseases of Our Time - Focusing on Diabetes Clinical staff conducted group or individual video education with verbal and written material and guidebook.  Patient learns why the best way to stop diseases of our time is prevention, through food and other lifestyle changes. Medicine (such as prescription pills and surgeries) is often only a Band-Aid on the problem, not a long-term solution. Most common diseases of our time include obesity, type 2 diabetes, hypertension, heart disease, and cancer. The Pritikin Program is recommended and has been proven to help reduce, reverse, and/or prevent the damaging effects of metabolic syndrome.  Nutrition   Overview of the Pritikin Eating Plan  Clinical staff conducted group or individual video education with verbal and written material and guidebook.  Patient learns about the Pritikin Eating Plan for  disease risk reduction. The Pritikin Eating Plan emphasizes a wide variety of unrefined, minimally-processed carbohydrates, like fruits, vegetables, whole grains, and legumes. Go, Caution, and Stop food choices are explained. Plant-based and lean animal proteins are emphasized. Rationale provided for low sodium intake for blood pressure control, low added sugars for blood sugar stabilization, and low added fats and oils for coronary artery disease risk reduction and weight management.  Calorie Density  Clinical staff conducted group or individual video education with verbal and written material and guidebook.  Patient learns about calorie density and how it impacts the Pritikin Eating Plan. Knowing the characteristics of the food you choose will help you decide whether those foods will lead to weight gain or weight loss, and whether you want to consume more or less of them. Weight loss is usually a side effect of the Pritikin Eating Plan because of its focus on low calorie-dense foods.  Label Reading  Clinical staff conducted group or individual video education with verbal and written material and guidebook.  Patient learns about the Pritikin recommended label reading guidelines and corresponding recommendations regarding calorie density, added sugars, sodium content, and whole grains.  Dining Out - Part 1  Clinical staff conducted group or individual video education with verbal and written material and guidebook.  Patient learns that restaurant meals can be sabotaging because they can be so high in calories, fat, sodium, and/or sugar. Patient learns recommended strategies on how to positively address this and avoid unhealthy pitfalls.  Facts on Fats  Clinical staff conducted group or individual video education with verbal and written material and guidebook.  Patient learns that lifestyle modifications can be just as effective, if not more so, as many medications for lowering your risk of heart  disease. A Pritikin lifestyle can help to reduce your  risk of inflammation and atherosclerosis (cholesterol build-up, or plaque, in the artery walls). Lifestyle interventions such as dietary choices and physical activity address the cause of atherosclerosis. A review of the types of fats and their impact on blood cholesterol levels, along with dietary recommendations to reduce fat intake is also included.  Nutrition Action Plan  Clinical staff conducted group or individual video education with verbal and written material and guidebook.  Patient learns how to incorporate Pritikin recommendations into their lifestyle. Recommendations include planning and keeping personal health goals in mind as an important part of their success.  Healthy Mind-Set    Healthy Minds, Bodies, Hearts  Clinical staff conducted group or individual video education with verbal and written material and guidebook.  Patient learns how to identify when they are stressed. Video will discuss the impact of that stress, as well as the many benefits of stress management. Patient will also be introduced to stress management techniques. The way we think, act, and feel has an impact on our hearts.  How Our Thoughts Can Heal Our Hearts  Clinical staff conducted group or individual video education with verbal and written material and guidebook.  Patient learns that negative thoughts can cause depression and anxiety. This can result in negative lifestyle behavior and serious health problems. Cognitive behavioral therapy is an effective method to help control our thoughts in order to change and improve our emotional outlook.  Additional Videos:  Exercise    Improving Performance  Clinical staff conducted group or individual video education with verbal and written material and guidebook.  Patient learns to use a non-linear approach by alternating intensity levels and lengths of time spent exercising to help burn more calories and lose more  body fat. Cardiovascular exercise helps improve heart health, metabolism, hormonal balance, blood sugar control, and recovery from fatigue. Resistance training improves strength, endurance, balance, coordination, reaction time, metabolism, and muscle mass. Flexibility exercise improves circulation, posture, and balance. Seek guidance from your physician and exercise physiologist before implementing an exercise routine and learn your capabilities and proper form for all exercise.  Introduction to Yoga  Clinical staff conducted group or individual video education with verbal and written material and guidebook.  Patient learns about yoga, a discipline of the coming together of mind, breath, and body. The benefits of yoga include improved flexibility, improved range of motion, better posture and core strength, increased lung function, weight loss, and positive self-image. Yoga's heart health benefits include lowered blood pressure, healthier heart rate, decreased cholesterol and triglyceride levels, improved immune function, and reduced stress. Seek guidance from your physician and exercise physiologist before implementing an exercise routine and learn your capabilities and proper form for all exercise.  Medical   Aging: Enhancing Your Quality of Life  Clinical staff conducted group or individual video education with verbal and written material and guidebook.  Patient learns key strategies and recommendations to stay in good physical health and enhance quality of life, such as prevention strategies, having an advocate, securing a Health Care Proxy and Power of Attorney, and keeping a list of medications and system for tracking them. It also discusses how to avoid risk for bone loss.  Biology of Weight Control  Clinical staff conducted group or individual video education with verbal and written material and guidebook.  Patient learns that weight gain occurs because we consume more calories than we burn  (eating more, moving less). Even if your body weight is normal, you may have higher ratios of fat compared to muscle  mass. Too much body fat puts you at increased risk for cardiovascular disease, heart attack, stroke, type 2 diabetes, and obesity-related cancers. In addition to exercise, following the Pritikin Eating Plan can help reduce your risk.  Decoding Lab Results  Clinical staff conducted group or individual video education with verbal and written material and guidebook.  Patient learns that lab test reflects one measurement whose values change over time and are influenced by many factors, including medication, stress, sleep, exercise, food, hydration, pre-existing medical conditions, and more. It is recommended to use the knowledge from this video to become more involved with your lab results and evaluate your numbers to speak with your doctor.   Diseases of Our Time - Overview  Clinical staff conducted group or individual video education with verbal and written material and guidebook.  Patient learns that according to the CDC, 50% to 70% of chronic diseases (such as obesity, type 2 diabetes, elevated lipids, hypertension, and heart disease) are avoidable through lifestyle improvements including healthier food choices, listening to satiety cues, and increased physical activity.  Sleep Disorders Clinical staff conducted group or individual video education with verbal and written material and guidebook.  Patient learns how good quality and duration of sleep are important to overall health and well-being. Patient also learns about sleep disorders and how they impact health along with recommendations to address them, including discussing with a physician.  Nutrition  Dining Out - Part 2 Clinical staff conducted group or individual video education with verbal and written material and guidebook.  Patient learns how to plan ahead and communicate in order to maximize their dining experience in a  healthy and nutritious manner. Included are recommended food choices based on the type of restaurant the patient is visiting.   Fueling a Banker conducted group or individual video education with verbal and written material and guidebook.  There is a strong connection between our food choices and our health. Diseases like obesity and type 2 diabetes are very prevalent and are in large-part due to lifestyle choices. The Pritikin Eating Plan provides plenty of food and hunger-curbing satisfaction. It is easy to follow, affordable, and helps reduce health risks.  Menu Workshop  Clinical staff conducted group or individual video education with verbal and written material and guidebook.  Patient learns that restaurant meals can sabotage health goals because they are often packed with calories, fat, sodium, and sugar. Recommendations include strategies to plan ahead and to communicate with the manager, chef, or server to help order a healthier meal.  Planning Your Eating Strategy  Clinical staff conducted group or individual video education with verbal and written material and guidebook.  Patient learns about the Pritikin Eating Plan and its benefit of reducing the risk of disease. The Pritikin Eating Plan does not focus on calories. Instead, it emphasizes high-quality, nutrient-rich foods. By knowing the characteristics of the foods, we choose, we can determine their calorie density and make informed decisions.  Targeting Your Nutrition Priorities  Clinical staff conducted group or individual video education with verbal and written material and guidebook.  Patient learns that lifestyle habits have a tremendous impact on disease risk and progression. This video provides eating and physical activity recommendations based on your personal health goals, such as reducing LDL cholesterol, losing weight, preventing or controlling type 2 diabetes, and reducing high blood  pressure.  Vitamins and Minerals  Clinical staff conducted group or individual video education with verbal and written material and guidebook.  Patient learns  different ways to obtain key vitamins and minerals, including through a recommended healthy diet. It is important to discuss all supplements you take with your doctor.   Healthy Mind-Set    Smoking Cessation  Clinical staff conducted group or individual video education with verbal and written material and guidebook.  Patient learns that cigarette smoking and tobacco addiction pose a serious health risk which affects millions of people. Stopping smoking will significantly reduce the risk of heart disease, lung disease, and many forms of cancer. Recommended strategies for quitting are covered, including working with your doctor to develop a successful plan.  Culinary   Becoming a Set designer conducted group or individual video education with verbal and written material and guidebook.  Patient learns that cooking at home can be healthy, cost-effective, quick, and puts them in control. Keys to cooking healthy recipes will include looking at your recipe, assessing your equipment needs, planning ahead, making it simple, choosing cost-effective seasonal ingredients, and limiting the use of added fats, salts, and sugars.  Cooking - Breakfast and Snacks  Clinical staff conducted group or individual video education with verbal and written material and guidebook.  Patient learns how important breakfast is to satiety and nutrition through the entire day. Recommendations include key foods to eat during breakfast to help stabilize blood sugar levels and to prevent overeating at meals later in the day. Planning ahead is also a key component.  Cooking - Educational psychologist conducted group or individual video education with verbal and written material and guidebook.  Patient learns eating strategies to improve overall  health, including an approach to cook more at home. Recommendations include thinking of animal protein as a side on your plate rather than center stage and focusing instead on lower calorie dense options like vegetables, fruits, whole grains, and plant-based proteins, such as beans. Making sauces in large quantities to freeze for later and leaving the skin on your vegetables are also recommended to maximize your experience.  Cooking - Healthy Salads and Dressing Clinical staff conducted group or individual video education with verbal and written material and guidebook.  Patient learns that vegetables, fruits, whole grains, and legumes are the foundations of the Pritikin Eating Plan. Recommendations include how to incorporate each of these in flavorful and healthy salads, and how to create homemade salad dressings. Proper handling of ingredients is also covered. Cooking - Soups and [employer] - Soups and Desserts Clinical staff conducted group or individual video education with verbal and written material and guidebook.  Patient learns that Pritikin soups and desserts make for easy, nutritious, and delicious snacks and meal components that are low in sodium, fat, sugar, and calorie density, while high in vitamins, minerals, and filling fiber. Recommendations include simple and healthy ideas for soups and desserts.   Overview     The Pritikin Solution Program Overview Clinical staff conducted group or individual video education with verbal and written material and guidebook.  Patient learns that the results of the Pritikin Program have been documented in more than 100 articles published in peer-reviewed journals, and the benefits include reducing risk factors for (and, in some cases, even reversing) high cholesterol, high blood pressure, type 2 diabetes, obesity, and more! An overview of the three key pillars of the Pritikin Program will be covered: eating well, doing regular exercise, and having a  healthy mind-set.  WORKSHOPS  Exercise: Exercise Basics: Building Your Action Plan Clinical staff led group instruction and group discussion  with PowerPoint presentation and patient guidebook. To enhance the learning environment the use of posters, models and videos may be added. At the conclusion of this workshop, patients will comprehend the difference between physical activity and exercise, as well as the benefits of incorporating both, into their routine. Patients will understand the FITT (Frequency, Intensity, Time, and Type) principle and how to use it to build an exercise action plan. In addition, safety concerns and other considerations for exercise and cardiac rehab will be addressed by the presenter. The purpose of this lesson is to promote a comprehensive and effective weekly exercise routine in order to improve patients' overall level of fitness.   Managing Heart Disease: Your Path to a Healthier Heart Clinical staff led group instruction and group discussion with PowerPoint presentation and patient guidebook. To enhance the learning environment the use of posters, models and videos may be added.At the conclusion of this workshop, patients will understand the anatomy and physiology of the heart. Additionally, they will understand how Pritikin's three pillars impact the risk factors, the progression, and the management of heart disease.  The purpose of this lesson is to provide a high-level overview of the heart, heart disease, and how the Pritikin lifestyle positively impacts risk factors.  Exercise Biomechanics Clinical staff led group instruction and group discussion with PowerPoint presentation and patient guidebook. To enhance the learning environment the use of posters, models and videos may be added. Patients will learn how the structural parts of their bodies function and how these functions impact their daily activities, movement, and exercise. Patients will learn how to  promote a neutral spine, learn how to manage pain, and identify ways to improve their physical movement in order to promote healthy living. The purpose of this lesson is to expose patients to common physical limitations that impact physical activity. Participants will learn practical ways to adapt and manage aches and pains, and to minimize their effect on regular exercise. Patients will learn how to maintain good posture while sitting, walking, and lifting.  Balance Training and Fall Prevention  Clinical staff led group instruction and group discussion with PowerPoint presentation and patient guidebook. To enhance the learning environment the use of posters, models and videos may be added. At the conclusion of this workshop, patients will understand the importance of their sensorimotor skills (vision, proprioception, and the vestibular system) in maintaining their ability to balance as they age. Patients will apply a variety of balancing exercises that are appropriate for their current level of function. Patients will understand the common causes for poor balance, possible solutions to these problems, and ways to modify their physical environment in order to minimize their fall risk. The purpose of this lesson is to teach patients about the importance of maintaining balance as they age and ways to minimize their risk of falling.  WORKSHOPS   Nutrition:  Fueling a Ship broker led group instruction and group discussion with PowerPoint presentation and patient guidebook. To enhance the learning environment the use of posters, models and videos may be added. Patients will review the foundational principles of the Pritikin Eating Plan and understand what constitutes a serving size in each of the food groups. Patients will also learn Pritikin-friendly foods that are better choices when away from home and review make-ahead meal and snack options. Calorie density will be reviewed and  applied to three nutrition priorities: weight maintenance, weight loss, and weight gain. The purpose of this lesson is to reinforce (in a group setting) the key  concepts around what patients are recommended to eat and how to apply these guidelines when away from home by planning and selecting Pritikin-friendly options. Patients will understand how calorie density may be adjusted for different weight management goals.  Mindful Eating  Clinical staff led group instruction and group discussion with PowerPoint presentation and patient guidebook. To enhance the learning environment the use of posters, models and videos may be added. Patients will briefly review the concepts of the Pritikin Eating Plan and the importance of low-calorie dense foods. The concept of mindful eating will be introduced as well as the importance of paying attention to internal hunger signals. Triggers for non-hunger eating and techniques for dealing with triggers will be explored. The purpose of this lesson is to provide patients with the opportunity to review the basic principles of the Pritikin Eating Plan, discuss the value of eating mindfully and how to measure internal cues of hunger and fullness using the Hunger Scale. Patients will also discuss reasons for non-hunger eating and learn strategies to use for controlling emotional eating.  Targeting Your Nutrition Priorities Clinical staff led group instruction and group discussion with PowerPoint presentation and patient guidebook. To enhance the learning environment the use of posters, models and videos may be added. Patients will learn how to determine their genetic susceptibility to disease by reviewing their family history. Patients will gain insight into the importance of diet as part of an overall healthy lifestyle in mitigating the impact of genetics and other environmental insults. The purpose of this lesson is to provide patients with the opportunity to assess their personal  nutrition priorities by looking at their family history, their own health history and current risk factors. Patients will also be able to discuss ways of prioritizing and modifying the Pritikin Eating Plan for their highest risk areas  Menu  Clinical staff led group instruction and group discussion with PowerPoint presentation and patient guidebook. To enhance the learning environment the use of posters, models and videos may be added. Using menus brought in from [employer], or printed from [employer], patients will apply the Pritikin dining out guidelines that were presented in the [employer] video. Patients will also be able to practice these guidelines in a variety of provided scenarios. The purpose of this lesson is to provide patients with the opportunity to practice hands-on learning of the Pritikin Dining Out guidelines with actual menus and practice scenarios.  Label Reading Clinical staff led group instruction and group discussion with PowerPoint presentation and patient guidebook. To enhance the learning environment the use of posters, models and videos may be added. Patients will review and discuss the Pritikin label reading guidelines presented in Pritikin's Label Reading Educational series video. Using fool labels brought in from local grocery stores and markets, patients will apply the label reading guidelines and determine if the packaged food meet the Pritikin guidelines. The purpose of this lesson is to provide patients with the opportunity to review, discuss, and practice hands-on learning of the Pritikin Label Reading guidelines with actual packaged food labels. Cooking School  Pritikin's [employer] are designed to teach patients ways to prepare quick, simple, and affordable recipes at home. The importance of nutrition's role in chronic disease risk reduction is reflected in its emphasis in the overall Pritikin program. By learning how to prepare  essential core Pritikin Eating Plan recipes, patients will increase control over what they eat; be able to customize the flavor of foods without the use of added salt,  sugar, or fat; and improve the quality of the food they consume. By learning a set of core recipes which are easily assembled, quickly prepared, and affordable, patients are more likely to prepare more healthy foods at home. These workshops focus on convenient breakfasts, simple entres, side dishes, and desserts which can be prepared with minimal effort and are consistent with nutrition recommendations for cardiovascular risk reduction. Cooking [employer] are taught by a Armed forces logistics/support/administrative officer (RD) who has been trained by the [employer]. The chef or RD has a clear understanding of the importance of minimizing - if not completely eliminating - added fat, sugar, and sodium in recipes. Throughout the series of Cooking School Workshop sessions, patients will learn about healthy ingredients and efficient methods of cooking to build confidence in their capability to prepare    Cooking School weekly topics:  Adding Flavor- Sodium-Free  Fast and Healthy Breakfasts  Powerhouse Plant-Based Proteins  Satisfying Salads and Dressings  Simple Sides and Sauces  International Cuisine-Spotlight on the [employer] Zones  Delicious Desserts  Savory Soups  [employer] - Meals in a Astronomer Appetizers and Snacks  Comforting Weekend Breakfasts  One-Pot Wonders   Fast Evening Meals  Landscape architect Your Pritikin Plate  WORKSHOPS   Healthy Mindset (Psychosocial):  Focused Goals, Sustainable Changes Clinical staff led group instruction and group discussion with PowerPoint presentation and patient guidebook. To enhance the learning environment the use of posters, models and videos may be added. Patients will be able to apply effective goal setting strategies to establish at least one personal goal, and  then take consistent, meaningful action toward that goal. They will learn to identify common barriers to achieving personal goals and develop strategies to overcome them. Patients will also gain an understanding of how our mind-set can impact our ability to achieve goals and the importance of cultivating a positive and growth-oriented mind-set. The purpose of this lesson is to provide patients with a deeper understanding of how to set and achieve personal goals, as well as the tools and strategies needed to overcome common obstacles which may arise along the way.  From Head to Heart: The Power of a Healthy Outlook  Clinical staff led group instruction and group discussion with PowerPoint presentation and patient guidebook. To enhance the learning environment the use of posters, models and videos may be added. Patients will be able to recognize and describe the impact of emotions and mood on physical health. They will discover the importance of self-care and explore self-care practices which may work for them. Patients will also learn how to utilize the 4 C's to cultivate a healthier outlook and better manage stress and challenges. The purpose of this lesson is to demonstrate to patients how a healthy outlook is an essential part of maintaining good health, especially as they continue their cardiac rehab journey.  Healthy Sleep for a Healthy Heart Clinical staff led group instruction and group discussion with PowerPoint presentation and patient guidebook. To enhance the learning environment the use of posters, models and videos may be added. At the conclusion of this workshop, patients will be able to demonstrate knowledge of the importance of sleep to overall health, well-being, and quality of life. They will understand the symptoms of, and treatments for, common sleep disorders. Patients will also be able to identify daytime and nighttime behaviors which impact sleep, and they will be able to apply these  tools to help manage sleep-related challenges. The purpose of  this lesson is to provide patients with a general overview of sleep and outline the importance of quality sleep. Patients will learn about a few of the most common sleep disorders. Patients will also be introduced to the concept of "sleep hygiene," and discover ways to self-manage certain sleeping problems through simple daily behavior changes. Finally, the workshop will motivate patients by clarifying the links between quality sleep and their goals of heart-healthy living.   Recognizing and Reducing Stress Clinical staff led group instruction and group discussion with PowerPoint presentation and patient guidebook. To enhance the learning environment the use of posters, models and videos may be added. At the conclusion of this workshop, patients will be able to understand the types of stress reactions, differentiate between acute and chronic stress, and recognize the impact that chronic stress has on their health. They will also be able to apply different coping mechanisms, such as reframing negative self-talk. Patients will have the opportunity to practice a variety of stress management techniques, such as deep abdominal breathing, progressive muscle relaxation, and/or guided imagery.  The purpose of this lesson is to educate patients on the role of stress in their lives and to provide healthy techniques for coping with it.  Learning Barriers/Preferences:  Learning Barriers/Preferences - 02/24/23 1434       Learning Barriers/Preferences   Learning Barriers Hearing   bilateral hearing aids   Learning Preferences Written Material;Computer/Internet             Education Topics:  Knowledge Questionnaire Score:  Knowledge Questionnaire Score - 02/24/23 1433       Knowledge Questionnaire Score   Pre Score 16/24             Core Components/Risk Factors/Patient Goals at Admission:  Personal Goals and Risk Factors at Admission -  02/24/23 1433       Core Components/Risk Factors/Patient Goals on Admission    Weight Management Weight Maintenance;Yes    Expected Outcomes Long Term: Adherence to nutrition and physical activity/exercise program aimed toward attainment of established weight goal;Weight Maintenance: Understanding of the daily nutrition guidelines, which includes 25-35% calories from fat, 7% or less cal from saturated fats, less than 200mg  cholesterol, less than 1.5gm of sodium, & 5 or more servings of fruits and vegetables daily;Understanding recommendations for meals to include 15-35% energy as protein, 25-35% energy from fat, 35-60% energy from carbohydrates, less than 200mg  of dietary cholesterol, 20-35 gm of total fiber daily;Understanding of distribution of calorie intake throughout the day with the consumption of 4-5 meals/snacks    Heart Failure Yes    Intervention Provide a combined exercise and nutrition program that is supplemented with education, support and counseling about heart failure. Directed toward relieving symptoms such as shortness of breath, decreased exercise tolerance, and extremity edema.    Expected Outcomes Improve functional capacity of life;Short term: Attendance in program 2-3 days a week with increased exercise capacity. Reported lower sodium intake. Reported increased fruit and vegetable intake. Reports medication compliance.;Short term: Daily weights obtained and reported for increase. Utilizing diuretic protocols set by physician.;Long term: Adoption of self-care skills and reduction of barriers for early signs and symptoms recognition and intervention leading to self-care maintenance.    Hypertension Yes    Intervention Provide education on lifestyle modifcations including regular physical activity/exercise, weight management, moderate sodium restriction and increased consumption of fresh fruit, vegetables, and low fat dairy, alcohol moderation, and smoking cessation.;Monitor  prescription use compliance.    Expected Outcomes Short Term: Continued assessment and intervention  until BP is < 140/56mm HG in hypertensive participants. < 130/68mm HG in hypertensive participants with diabetes, heart failure or chronic kidney disease.;Long Term: Maintenance of blood pressure at goal levels.    Lipids Yes    Intervention Provide education and support for participant on nutrition & aerobic/resistive exercise along with prescribed medications to achieve LDL 70mg , HDL >40mg .    Expected Outcomes Short Term: Participant states understanding of desired cholesterol values and is compliant with medications prescribed. Participant is following exercise prescription and nutrition guidelines.;Long Term: Cholesterol controlled with medications as prescribed, with individualized exercise RX and with personalized nutrition plan. Value goals: LDL < 70mg , HDL > 40 mg.             Core Components/Risk Factors/Patient Goals Review:   Goals and Risk Factor Review     Row Name 03/02/23 1659             Core Components/Risk Factors/Patient Goals Review   Personal Goals Review Weight Management/Obesity;Heart Failure;Hypertension;Lipids       Review Fanisha started intensive cardiac rehab on 03/02/23 and did well with exercise, Vital signs were stable       Expected Outcomes Randi will continue to participate in intensive cardiac rehab for exercise, nutrition and lifestyle modifications                Core Components/Risk Factors/Patient Goals at Discharge (Final Review):   Goals and Risk Factor Review - 03/02/23 1659       Core Components/Risk Factors/Patient Goals Review   Personal Goals Review Weight Management/Obesity;Heart Failure;Hypertension;Lipids    Review Natausha started intensive cardiac rehab on 03/02/23 and did well with exercise, Vital signs were stable    Expected Outcomes [place] will continue to participate in intensive cardiac rehab for exercise, nutrition and lifestyle  modifications             ITP Comments:  ITP Comments     Row Name 02/24/23 1047 03/02/23 1150         ITP Comments Introduction to [facility] Program/ Intensive Cardiac Rehab. Initial Orientation Packet reviewed with the patient. 30 Day ITP Review. Rhena  started intensive cardiac rehab on 03/02/23 and did well with exercise               Comments: See ITP Comments

## 2023-03-04 ENCOUNTER — Encounter (HOSPITAL_COMMUNITY)
Admission: RE | Admit: 2023-03-04 | Discharge: 2023-03-04 | Disposition: A | Discharge status: Home / Self Care | Payer: Medicare Other | Source: Ambulatory Visit | Attending: Cardiology | Admitting: Cardiology

## 2023-03-04 DIAGNOSIS — I214 Non-ST elevation (NSTEMI) myocardial infarction: Secondary | ICD-10-CM

## 2023-03-04 DIAGNOSIS — Z955 Presence of coronary angioplasty implant and graft: Secondary | ICD-10-CM

## 2023-03-06 ENCOUNTER — Encounter (HOSPITAL_COMMUNITY)
Admission: RE | Admit: 2023-03-06 | Discharge: 2023-03-06 | Disposition: A | Discharge status: Home / Self Care | Payer: Medicare Other | Source: Ambulatory Visit | Attending: Cardiology | Admitting: Cardiology

## 2023-03-06 DIAGNOSIS — Z955 Presence of coronary angioplasty implant and graft: Secondary | ICD-10-CM

## 2023-03-06 DIAGNOSIS — I214 Non-ST elevation (NSTEMI) myocardial infarction: Secondary | ICD-10-CM | POA: Diagnosis not present

## 2023-03-09 ENCOUNTER — Encounter (HOSPITAL_COMMUNITY)
Admission: RE | Admit: 2023-03-09 | Discharge: 2023-03-09 | Disposition: A | Discharge status: Home / Self Care | Payer: Medicare Other | Source: Ambulatory Visit | Attending: Cardiology | Admitting: Cardiology

## 2023-03-09 DIAGNOSIS — I214 Non-ST elevation (NSTEMI) myocardial infarction: Secondary | ICD-10-CM | POA: Diagnosis not present

## 2023-03-09 DIAGNOSIS — Z955 Presence of coronary angioplasty implant and graft: Secondary | ICD-10-CM

## 2023-03-11 ENCOUNTER — Encounter (HOSPITAL_COMMUNITY)
Admission: RE | Admit: 2023-03-11 | Discharge: 2023-03-11 | Disposition: A | Discharge status: Home / Self Care | Payer: Medicare Other | Source: Ambulatory Visit | Attending: Cardiology | Admitting: Cardiology

## 2023-03-11 DIAGNOSIS — I214 Non-ST elevation (NSTEMI) myocardial infarction: Secondary | ICD-10-CM

## 2023-03-11 DIAGNOSIS — Z955 Presence of coronary angioplasty implant and graft: Secondary | ICD-10-CM | POA: Diagnosis not present

## 2023-03-13 ENCOUNTER — Encounter (HOSPITAL_COMMUNITY)
Admission: RE | Admit: 2023-03-13 | Discharge: 2023-03-13 | Disposition: A | Discharge status: Home / Self Care | Payer: Medicare Other | Source: Ambulatory Visit | Attending: Cardiology | Admitting: Cardiology

## 2023-03-13 DIAGNOSIS — Z955 Presence of coronary angioplasty implant and graft: Secondary | ICD-10-CM | POA: Diagnosis not present

## 2023-03-13 DIAGNOSIS — I214 Non-ST elevation (NSTEMI) myocardial infarction: Secondary | ICD-10-CM | POA: Diagnosis not present

## 2023-03-16 ENCOUNTER — Encounter (HOSPITAL_COMMUNITY)
Admission: RE | Admit: 2023-03-16 | Discharge: 2023-03-16 | Disposition: A | Discharge status: Home / Self Care | Payer: Medicare Other | Source: Ambulatory Visit | Attending: Cardiology | Admitting: Cardiology

## 2023-03-16 DIAGNOSIS — Z955 Presence of coronary angioplasty implant and graft: Secondary | ICD-10-CM

## 2023-03-16 DIAGNOSIS — I214 Non-ST elevation (NSTEMI) myocardial infarction: Secondary | ICD-10-CM | POA: Diagnosis not present

## 2023-03-17 ENCOUNTER — Ambulatory Visit (HOSPITAL_COMMUNITY)
Admission: RE | Admit: 2023-03-17 | Discharge: 2023-03-17 | Disposition: A | Discharge status: Home / Self Care | Payer: Medicare Other | Source: Ambulatory Visit | Attending: Family Medicine | Admitting: Family Medicine

## 2023-03-17 ENCOUNTER — Encounter (HOSPITAL_COMMUNITY): Payer: Self-pay

## 2023-03-17 VITALS — BP 102/58 | HR 62 | Ht 64.0 in | Wt 129.0 lb

## 2023-03-17 DIAGNOSIS — I2510 Atherosclerotic heart disease of native coronary artery without angina pectoris: Secondary | ICD-10-CM | POA: Diagnosis not present

## 2023-03-17 DIAGNOSIS — I255 Ischemic cardiomyopathy: Secondary | ICD-10-CM | POA: Diagnosis not present

## 2023-03-17 DIAGNOSIS — Z79899 Other long term (current) drug therapy: Secondary | ICD-10-CM | POA: Insufficient documentation

## 2023-03-17 DIAGNOSIS — I447 Left bundle-branch block, unspecified: Secondary | ICD-10-CM | POA: Insufficient documentation

## 2023-03-17 DIAGNOSIS — I110 Hypertensive heart disease with heart failure: Secondary | ICD-10-CM | POA: Insufficient documentation

## 2023-03-17 DIAGNOSIS — I480 Paroxysmal atrial fibrillation: Secondary | ICD-10-CM | POA: Diagnosis not present

## 2023-03-17 DIAGNOSIS — Z7984 Long term (current) use of oral hypoglycemic drugs: Secondary | ICD-10-CM | POA: Insufficient documentation

## 2023-03-17 DIAGNOSIS — Z7901 Long term (current) use of anticoagulants: Secondary | ICD-10-CM | POA: Insufficient documentation

## 2023-03-17 DIAGNOSIS — I5023 Acute on chronic systolic (congestive) heart failure: Secondary | ICD-10-CM | POA: Insufficient documentation

## 2023-03-17 DIAGNOSIS — I5022 Chronic systolic (congestive) heart failure: Secondary | ICD-10-CM

## 2023-03-17 DIAGNOSIS — Z7902 Long term (current) use of antithrombotics/antiplatelets: Secondary | ICD-10-CM | POA: Diagnosis not present

## 2023-03-17 DIAGNOSIS — I252 Old myocardial infarction: Secondary | ICD-10-CM | POA: Diagnosis not present

## 2023-03-17 DIAGNOSIS — Z8249 Family history of ischemic heart disease and other diseases of the circulatory system: Secondary | ICD-10-CM | POA: Diagnosis not present

## 2023-03-17 DIAGNOSIS — I4901 Ventricular fibrillation: Secondary | ICD-10-CM | POA: Diagnosis not present

## 2023-03-17 MED ORDER — FUROSEMIDE 40 MG PO TABS: 40.0000 mg | ORAL_TABLET | Freq: Every day | ORAL | 3 refills | Status: DC

## 2023-03-17 NOTE — Progress Notes (Signed)
PCP: Geoffry Paradise, MD Cardiology: Dr. Shirlee Latch  89 y.o. with history of breast cancer, chronic LBBB, and HTN was admitted to a hospital in [place], [place] with NSTEMI and VF arrest in 2/24.  She was referred by Dr. Jacky Kindle for evaluation of CHF, atrial fibrillation, and acute systolic CHF.  Prior to MI in 2/24, patient was quite healthy.  She played tennis and pickleball. She had no exertional dyspnea or chest pain.  She had a DVT in 2019 after a long car trip.  She had also, of note, received Adriamycin years ago with treatment of breast cancer.   In 2/24, she developed severe "heartburn" one evening.  Meds for heartburn did not help.  Pain eventually eased off and she was able to sleep.  However, chest pain returned again the next day and she went to the hospital.  While checking into the ER, she had a VF arrest requiring defibrillation and CPR. She broke several ribs. She was taken for cath, this showed severe stenosis in LCx that was treated with DES.  She developed atrial fibrillation while in the hospital and Eliquis was started.  She also was noted to have CHF and received IV Lasix. She was discharged with a Lifevest. At my initial visit with her, she was short of breath and volume overloaded, Lasix was increased.  Echo in 3/24 showed EF 30-35%, moderate infarct-related MR, normal RV.   Echo 5/24 showed  EF 35-40% with basal to mid inferolateral and anterolateral akinesis, RV normal, PASP 31 mmHg, trivial MR. LifeVest removed.  Today she returns for HF follow up. Overall feeling fine. She recently played golf with her son and had no dyspnea. She is doing CR 3x/week and doing well with this. Denies palpitations, abnormal bleeding, CP, dizziness, edema, or PND/Orthopnea. Appetite ok. No fever or chills. Weight at home 127 pounds. Taking all medications, she is asking if she can decrease her Lasix dosage.  ECG (personally reviewed): none ordered today.  Labs (2/24): LDL 69, HDL 36 Labs (5/24): K  3.9, creatinine 1.16, BNP 331, hgb 13.1 Labs (5/24): K 3.5, creatinine 1.26, LDL 50  PMH: 1. LBBB: Chronic 2. HTN 3. Breast cancer: Treated in 2008 with surgery and radiation.  She had Adriamycin.  4. Hyperlipidemia 5. DVT: 2019, in setting of long car ride.  6. Atrial fibrillation: Paroxysmal.  First noted during admission for MI in 2024.  7. CAD: NSTEMI 2/24, treated in [place], [place].  Cath showed severe stenosis in LCx, she had DES placed.  8. Chronic systolic CHF: Echo in 9/19 with EF 50-55%.  Ischemic cardiomyopathy.  Echo from 2/24 in [place] per family's report showed a weak heart.  - Echo (3/24): EF 30-35%, moderate infarct-related MR, normal RV.  - Echo (5/24): EF 35-40% with basal to mid inferolateral and anterolateral akinesis, RV normal, PASP 31 mmHg, trivial MR.  9. VF arrest: In 2/24 in the setting of MI.    SH: Widow, lives in [place].  2 children.  No smoking.  No ETOH.   FH: Son with CABG.   ROS: All systems reviewed and negative except as per HPI.   Current Outpatient Medications  Medication Sig Dispense Refill   apixaban (ELIQUIS) 5 MG TABS tablet Take 1 tablet (5 mg total) by mouth 2 (two) times daily. 60 tablet 11   atorvastatin (LIPITOR) 40 MG tablet Take 1 tablet (40 mg total) by mouth daily. 90 tablet 3   calcium-vitamin D (OSCAL WITH D) 500-5 MG-MCG tablet Take 1  tablet by mouth.     carvedilol (COREG) 3.125 MG tablet Take 1 tablet (3.125 mg total) by mouth 2 (two) times daily with a meal. 60 tablet 11   clopidogrel (PLAVIX) 75 MG tablet Take 1 tablet (75 mg total) by mouth daily. 30 tablet 11   dapagliflozin propanediol (FARXIGA) 10 MG TABS tablet Take 1 tablet (10 mg total) by mouth daily before breakfast. 30 tablet 11   furosemide (LASIX) 40 MG tablet Take 1 tablet (40 mg total) by mouth 2 (two) times daily. 90 tablet 3   Multiple Vitamins-Minerals (EYE VITAMINS PO) Take 1 tablet by mouth daily.      spironolactone (ALDACTONE) 25 MG tablet Take 1 tablet  (25 mg total) by mouth daily. 90 tablet 3   valsartan (DIOVAN) 40 MG tablet Take 0.5 tablets (20 mg total) by mouth 2 (two) times daily. 90 tablet 3   diazepam (VALIUM) 5 MG tablet As needed (Patient not taking: Reported on 03/17/2023)     nitroGLYCERIN (NITROSTAT) 0.4 MG SL tablet Place 1 tablet (0.4 mg total) under the tongue every 5 (five) minutes as needed for chest pain. (Patient not taking: Reported on 03/17/2023) 90 tablet 3   No current facility-administered medications for this encounter.   Wt Readings from Last 3 Encounters:  03/17/23 58.5 kg (129 lb)  02/24/23 59.9 kg (132 lb 0.9 oz)  02/03/23 59.1 kg (130 lb 6.4 oz)   BP (!) 102/58   Pulse 62   Ht 5\' 4"  (1.626 m)   Wt 58.5 kg (129 lb)   SpO2 96%   BMI 22.14 kg/m  Physical Exam General:  NAD. No resp difficulty, appears younger than stated age. HEENT: Normal Neck: Supple. No JVD. Carotids 2+ bilat; no bruits. No lymphadenopathy or thryomegaly appreciated. Cor: PMI nondisplaced. Regular rate & rhythm. No rubs, gallops or murmurs. Lungs: Clear Abdomen: Soft, nontender, nondistended. No hepatosplenomegaly. No bruits or masses. Good bowel sounds. Extremities: No cyanosis, clubbing, rash, edema Neuro: Alert & oriented x 3, cranial nerves grossly intact. Moves all 4 extremities w/o difficulty. Affect pleasant.  Assessment/Plan: 1. CAD: NSTEMI in 2/24 with DES to LCx.  Complicated by VF arrest, acute systolic CHF, and atrial fibrillation.  No further chest pain.  ECG with LBBB-like IVCD.  - Continue Plavix 75 daily.  She is not on ASA given Eliquis use. Will continue Plavix for up to a year as long as no bleeding issues.  - Continue atorvastatin 40 mg daily, good lipids 5/24. - Continue cardiac rehab. - She has a prescription for sublingual NTG.  2. Atrial fibrillation: Paroxysmal.  First noted in 2/24 with MI.  She is regular on exam today.  - Continue Eliquis. No bleeding issues. - Continue Coreg.  3. Chronic systolic CHF:  Ischemic cardiomyopathy. Echo in [place], [place] at time of NSTEMI apparently showed low EF per family's report.  Echo in 3/24 with EF 30-35%, moderate infarct-related MR, normal RV.  Echo today showed EF 35-40%, trivial MR, normal RV.  NYHA class I-II, not volume overloaded on exam, weight down 3 lbs.  Doing well. - She would like to try decreasing Lasix dose. OK to decrease Lasix to 40 mg daily, we discussed watching for signs of volume overload and gave instructions to add back 20-40 mg in evenings if she needs.  - Continue valsartan 20 mg bid. Recent labs reviewed and stable.  - Continue Farxiga 10 mg daily.  - Continue spironolactone 25 mg daily.  - EF > 35% at this  point and no further ventricular arrhythmias, no indication for LifeVest. LBBB-like IVCD is not wide enough that she would be likely to show benefit with CRT.  4. VF arrest: In setting of MI in [place] in 2/24. Given revascularization, ICD was not placed during her admission.  - EF > 35%, no indication for LifeVest and now removed.  Follow up in 4 months with Dr. Kathreen Cornfield [facility] FNP-BC 03/17/2023

## 2023-03-17 NOTE — Patient Instructions (Addendum)
Thank you for coming in today  If you had labs drawn today, any labs that are abnormal the clinic will call you No news is good news  Medications: DECREASE Lasix to 40 mg daily   (its ok to add 20-40 mg back every evening ifyou have swelling or shortness of breath)  Follow up appointments:  Your physician recommends that you schedule a follow-up appointment in:  4 months With Dr. Earlean Shawl will receive a reminder letter in the mail a few months in advance. If you don't receive a letter, please call our office to schedule the follow-up appointment.    Do the following things EVERYDAY: Weigh yourself in the morning before breakfast. Write it down and keep it in a log. Take your medicines as prescribed Eat low salt foods--Limit salt (sodium) to 2000 mg per day.  Stay as active as you can everyday Limit all fluids for the day to less than 2 liters   At the [facility], you and your health needs are our priority. As part of our continuing mission to provide you with exceptional heart care, we have created designated Provider Care Teams. These Care Teams include your primary Cardiologist (physician) and Advanced Practice Providers (APPs- Physician Assistants and Nurse Practitioners) who all work together to provide you with the care you need, when you need it.   You may see any of the following providers on your designated Care Team at your next follow up: Dr Arvilla Meres Dr [name] Dr. Marcos Eke, NP Robbie Lis, [place] [facility] [place], [place] Brynda Peon, NP Karle Plumber, PharmD   Please be sure to bring in all your medications bottles to every appointment.    Thank you for choosing [facility]  If you have any questions or concerns before your next appointment please send [place] a message through [place] or call our office at [phone].    TO LEAVE A MESSAGE FOR THE NURSE SELECT  OPTION 2, PLEASE LEAVE A MESSAGE INCLUDING: YOUR NAME DATE OF BIRTH CALL BACK NUMBER REASON FOR CALL**this is important as we prioritize the call backs  YOU WILL RECEIVE A CALL BACK THE SAME DAY AS LONG AS YOU CALL BEFORE 4:00 PM

## 2023-03-18 ENCOUNTER — Encounter (HOSPITAL_COMMUNITY)
Admission: RE | Admit: 2023-03-18 | Discharge: 2023-03-18 | Disposition: A | Discharge status: Home / Self Care | Payer: Medicare Other | Source: Ambulatory Visit | Attending: Cardiology | Admitting: Cardiology

## 2023-03-18 DIAGNOSIS — Z955 Presence of coronary angioplasty implant and graft: Secondary | ICD-10-CM | POA: Diagnosis not present

## 2023-03-18 DIAGNOSIS — I214 Non-ST elevation (NSTEMI) myocardial infarction: Secondary | ICD-10-CM

## 2023-03-20 ENCOUNTER — Encounter (HOSPITAL_COMMUNITY)
Admission: RE | Admit: 2023-03-20 | Discharge: 2023-03-20 | Disposition: A | Discharge status: Home / Self Care | Payer: Medicare Other | Source: Ambulatory Visit | Attending: Cardiology | Admitting: Cardiology

## 2023-03-20 DIAGNOSIS — I214 Non-ST elevation (NSTEMI) myocardial infarction: Secondary | ICD-10-CM

## 2023-03-20 DIAGNOSIS — Z955 Presence of coronary angioplasty implant and graft: Secondary | ICD-10-CM | POA: Diagnosis not present

## 2023-03-23 ENCOUNTER — Encounter (HOSPITAL_COMMUNITY)
Admission: RE | Admit: 2023-03-23 | Discharge: 2023-03-23 | Disposition: A | Discharge status: Home / Self Care | Payer: Medicare Other | Source: Ambulatory Visit | Attending: Cardiology | Admitting: Cardiology

## 2023-03-23 DIAGNOSIS — Z955 Presence of coronary angioplasty implant and graft: Secondary | ICD-10-CM | POA: Diagnosis not present

## 2023-03-23 DIAGNOSIS — M19071 Primary osteoarthritis, right ankle and foot: Secondary | ICD-10-CM | POA: Diagnosis not present

## 2023-03-23 DIAGNOSIS — I214 Non-ST elevation (NSTEMI) myocardial infarction: Secondary | ICD-10-CM

## 2023-03-23 DIAGNOSIS — M67471 Ganglion, right ankle and foot: Secondary | ICD-10-CM | POA: Diagnosis not present

## 2023-03-23 DIAGNOSIS — M792 Neuralgia and neuritis, unspecified: Secondary | ICD-10-CM | POA: Diagnosis not present

## 2023-03-23 DIAGNOSIS — M19072 Primary osteoarthritis, left ankle and foot: Secondary | ICD-10-CM | POA: Diagnosis not present

## 2023-03-23 DIAGNOSIS — M2041 Other hammer toe(s) (acquired), right foot: Secondary | ICD-10-CM | POA: Diagnosis not present

## 2023-03-23 DIAGNOSIS — M2042 Other hammer toe(s) (acquired), left foot: Secondary | ICD-10-CM | POA: Diagnosis not present

## 2023-03-23 NOTE — Progress Notes (Signed)
Reviewed home exercise guidelines with [place] including endpoints, temperature precautions, target heart rate and rate of perceived exertion. She is  currently walking 30 minutes 3 days/week in her outdoor pool as her mode of home exercise. She voices understanding of instructions given.  Artist Pais, MS, ACSM CEP

## 2023-03-25 ENCOUNTER — Encounter (HOSPITAL_COMMUNITY)
Admission: RE | Admit: 2023-03-25 | Discharge: 2023-03-25 | Disposition: A | Discharge status: Home / Self Care | Payer: Medicare Other | Source: Ambulatory Visit | Attending: Cardiology | Admitting: Cardiology

## 2023-03-25 DIAGNOSIS — Z955 Presence of coronary angioplasty implant and graft: Secondary | ICD-10-CM | POA: Diagnosis not present

## 2023-03-25 DIAGNOSIS — I214 Non-ST elevation (NSTEMI) myocardial infarction: Secondary | ICD-10-CM

## 2023-03-27 ENCOUNTER — Encounter (HOSPITAL_COMMUNITY)
Admission: RE | Admit: 2023-03-27 | Discharge: 2023-03-27 | Disposition: A | Discharge status: Home / Self Care | Payer: Medicare Other | Source: Ambulatory Visit | Attending: Cardiology | Admitting: Cardiology

## 2023-03-27 DIAGNOSIS — Z955 Presence of coronary angioplasty implant and graft: Secondary | ICD-10-CM | POA: Diagnosis not present

## 2023-03-27 DIAGNOSIS — I214 Non-ST elevation (NSTEMI) myocardial infarction: Secondary | ICD-10-CM | POA: Diagnosis not present

## 2023-03-30 ENCOUNTER — Encounter (HOSPITAL_COMMUNITY)
Admission: RE | Admit: 2023-03-30 | Discharge: 2023-03-30 | Disposition: A | Discharge status: Home / Self Care | Payer: Medicare Other | Source: Ambulatory Visit | Attending: Cardiology | Admitting: Cardiology

## 2023-03-30 DIAGNOSIS — I214 Non-ST elevation (NSTEMI) myocardial infarction: Secondary | ICD-10-CM | POA: Diagnosis not present

## 2023-03-30 DIAGNOSIS — Z955 Presence of coronary angioplasty implant and graft: Secondary | ICD-10-CM | POA: Diagnosis not present

## 2023-03-31 NOTE — Progress Notes (Signed)
Cardiac Individual Treatment Plan  Patient Details  Name: Marissa Carroll MRN: [id number] Date of Birth: 05-10-1928 Referring Provider:   Flowsheet Row INTENSIVE CARDIAC REHAB ORIENT from 02/24/2023 in [facility]  Referring Provider [name], MD       Initial Encounter Date:  Flowsheet Row INTENSIVE CARDIAC REHAB ORIENT from 02/24/2023 in [facility]  Date 02/24/23       Visit Diagnosis: 11/14/22 NSTEMI (non-ST elevated myocardial infarction) (HCC)  11/14/22 Status post coronary artery stent placement  Patient's Home Medications on Admission:  Current Outpatient Medications:    apixaban (ELIQUIS) 5 MG TABS tablet, Take 1 tablet (5 mg total) by mouth 2 (two) times daily., Disp: 60 tablet, Rfl: 11   atorvastatin (LIPITOR) 40 MG tablet, Take 1 tablet (40 mg total) by mouth daily., Disp: 90 tablet, Rfl: 3   calcium-vitamin D (OSCAL WITH D) 500-5 MG-MCG tablet, Take 1 tablet by mouth., Disp: , Rfl:    carvedilol (COREG) 3.125 MG tablet, Take 1 tablet (3.125 mg total) by mouth 2 (two) times daily with a meal., Disp: 60 tablet, Rfl: 11   clopidogrel (PLAVIX) 75 MG tablet, Take 1 tablet (75 mg total) by mouth daily., Disp: 30 tablet, Rfl: 11   dapagliflozin propanediol (FARXIGA) 10 MG TABS tablet, Take 1 tablet (10 mg total) by mouth daily before breakfast., Disp: 30 tablet, Rfl: 11   diazepam (VALIUM) 5 MG tablet, As needed (Patient not taking: Reported on 03/17/2023), Disp: , Rfl:    furosemide (LASIX) 40 MG tablet, Take 1 tablet (40 mg total) by mouth daily., Disp: 90 tablet, Rfl: 3   Multiple Vitamins-Minerals (EYE VITAMINS PO), Take 1 tablet by mouth daily. , Disp: , Rfl:    nitroGLYCERIN (NITROSTAT) 0.4 MG SL tablet, Place 1 tablet (0.4 mg total) under the tongue every 5 (five) minutes as needed for chest pain. (Patient not taking: Reported on 03/17/2023), Disp: 90  tablet, Rfl: 3   spironolactone (ALDACTONE) 25 MG tablet, Take 1 tablet (25 mg total) by mouth daily., Disp: 90 tablet, Rfl: 3   valsartan (DIOVAN) 40 MG tablet, Take 0.5 tablets (20 mg total) by mouth 2 (two) times daily., Disp: 90 tablet, Rfl: 3  Past Medical History: Past Medical History:  Diagnosis Date   CHF (congestive heart failure) (HCC)    Coronary artery disease    History of breast cancer    Hyperlipidemia    Hypertension     Tobacco Use: Social History   Tobacco Use  Smoking Status Never  Smokeless Tobacco Never    Labs: Review Flowsheet       Latest Ref Rng & Units 11/27/2022 02/13/2023  Labs for ITP Cardiac and Pulmonary Rehab  Cholestrol 0 - 200 mg/dL 782  956   LDL (calc) 0 - 99 mg/dL 69  50   HDL-C >21 mg/dL 36  39   Trlycerides <308 mg/dL 94  87     Capillary Blood Glucose: No results found for: "GLUCAP"   Exercise Target Goals: Exercise Program Goal: Individual exercise prescription set using results from initial 6 min walk test and THRR while considering  patient's activity barriers and safety.   Exercise Prescription Goal: Initial exercise prescription builds to 30-45 minutes a day of aerobic activity, 2-3 days per week.  Home exercise guidelines will be given to patient during program as part of exercise prescription that the participant will acknowledge.  Activity Barriers & Risk Stratification:  Activity Barriers & Cardiac Risk Stratification - 02/24/23 1427       Activity Barriers & Cardiac Risk Stratification   Activity Barriers Joint Problems;Deconditioning;Muscular Weakness;Decreased Ventricular Function;Balance Concerns;Arthritis    Cardiac Risk Stratification High             6 Minute Walk:  6 Minute Walk     Row Name 02/24/23 1143         6 Minute Walk   Phase Initial     Distance 1274 feet     Walk Time 6 minutes     # of Rest Breaks 0     MPH 2.41     METS 1.7     RPE 11     Perceived Dyspnea  0     VO2 Peak 5.8      Symptoms Yes (comment)     Comments Bilateral Hip pain 3/10     Resting HR 64 bpm     Resting BP 88/50     Resting Oxygen Saturation  99 %     Exercise Oxygen Saturation  during 6 min walk 100 %     Max Ex. HR 83 bpm     Max Ex. BP 126/70     2 Minute Post BP 110/68              Oxygen Initial Assessment:   Oxygen Re-Evaluation:   Oxygen Discharge (Final Oxygen Re-Evaluation):   Initial Exercise Prescription:  Initial Exercise Prescription - 02/24/23 1400       Date of Initial Exercise RX and Referring Provider   Date 02/24/23    Referring Provider [name], MD    Expected Discharge Date 05/08/23      Recumbant Bike   Level 1    RPM 60    Watts 25    Minutes 15    METs 1.7      NuStep   Level 2    SPM 75    Minutes 15    METs 1.7      Prescription Details   Frequency (times per week) 3    Duration Progress to 30 minutes of continuous aerobic without signs/symptoms of physical distress      Intensity   THRR 40-80% of Max Heartrate 51-102    Ratings of Perceived Exertion 11-13    Perceived Dyspnea 0-4      Progression   Progression Continue progressive overload as per policy without signs/symptoms or physical distress.      Resistance Training   Training Prescription Yes    Weight 2 lbs    Reps 10-15             Perform Capillary Blood Glucose checks as needed.  Exercise Prescription Changes:   Exercise Prescription Changes     Row Name 03/02/23 1018 03/16/23 1020 03/30/23 1022         Response to Exercise   Blood Pressure (Admit) 110/60 112/60 102/68     Blood Pressure (Exercise) 120/70 110/58 130/78     Blood Pressure (Exit) 110/64 104/70 104/60     Heart Rate (Admit) 64 bpm 62 bpm 66 bpm     Heart Rate (Exercise) 85 bpm 89 bpm 96 bpm     Heart Rate (Exit) 64 bpm 71 bpm 75 bpm     Rating of Perceived Exertion (Exercise) 13 11 11      Symptoms None None None     Comments Off to a  good start with exercise. -- Reviewed  METs with patient.     Duration Continue with 30 min of aerobic exercise without signs/symptoms of physical distress. Continue with 30 min of aerobic exercise without signs/symptoms of physical distress. Continue with 30 min of aerobic exercise without signs/symptoms of physical distress.     Intensity THRR unchanged THRR unchanged THRR unchanged       Progression   Progression Continue to progress workloads to maintain intensity without signs/symptoms of physical distress. Continue to progress workloads to maintain intensity without signs/symptoms of physical distress. Continue to progress workloads to maintain intensity without signs/symptoms of physical distress.     Average METs 2 2.3 2.8       Resistance Training   Training Prescription Yes Yes Yes     Weight 2 lbs 2 lbs 2 lbs     Reps 10-15 10-15 10-15     Time 10 Minutes 10 Minutes 10 Minutes       Interval Training   Interval Training No No No       Recumbant Bike   Level 1 1 2      RPM 65 58 63     Watts -- 14 25     Minutes 15 15 15      METs 2.2 2 2.5       NuStep   Level 2 2 3      SPM 68 84 85     Minutes 15 15 15      METs 1.9 2.7 3.1       Home Exercise Plan   Plans to continue exercise at -- -- Home (comment)  Walking in pool     Frequency -- -- Add 3 additional days to program exercise sessions.     Initial Home Exercises Provided -- -- 03/23/23              Exercise Comments:   Exercise Comments     Row Name 03/02/23 1130 03/18/23 1112 03/23/23 1057 03/30/23 1115     Exercise Comments Patient tolerated low intensity exercise well without symptoms. Reviewed METs and goals with patient. Reviewed home exercise guidelines and goals with patient. Reviewed METs with patient.             Exercise Goals and Review:   Exercise Goals     Row Name 02/24/23 1429             Exercise Goals   Increase Physical Activity Yes       Intervention Provide advice, education, support and counseling about  physical activity/exercise needs.;Develop an individualized exercise prescription for aerobic and resistive training based on initial evaluation findings, risk stratification, comorbidities and participant's personal goals.       Expected Outcomes Short Term: Attend rehab on a regular basis to increase amount of physical activity.;Long Term: Add in home exercise to make exercise part of routine and to increase amount of physical activity.;Long Term: Exercising regularly at least 3-5 days a week.       Increase Strength and Stamina Yes       Intervention Provide advice, education, support and counseling about physical activity/exercise needs.;Develop an individualized exercise prescription for aerobic and resistive training based on initial evaluation findings, risk stratification, comorbidities and participant's personal goals.       Expected Outcomes Short Term: Increase workloads from initial exercise prescription for resistance, speed, and METs.;Short Term: Perform resistance training exercises routinely during rehab and add in resistance training at home;Long Term: Improve cardiorespiratory fitness,  muscular endurance and strength as measured by increased METs and functional capacity ( )       Able to understand and use rate of perceived exertion (RPE) scale Yes       Intervention Provide education and explanation on how to use RPE scale       Expected Outcomes Short Term: Able to use RPE daily in rehab to express subjective intensity level;Long Term:  Able to use RPE to guide intensity level when exercising independently       Knowledge and understanding of Target Heart Rate Range (THRR) Yes       Intervention Provide education and explanation of THRR including how the numbers were predicted and where they are located for reference       Expected Outcomes Short Term: Able to state/look up THRR;Short Term: Able to use daily as guideline for intensity in rehab;Long Term: Able to use THRR to govern  intensity when exercising independently       Understanding of Exercise Prescription Yes       Intervention Provide education, explanation, and written materials on patient's individual exercise prescription       Expected Outcomes Short Term: Able to explain program exercise prescription;Long Term: Able to explain home exercise prescription to exercise independently                Exercise Goals Re-Evaluation :  Exercise Goals Re-Evaluation     Row Name 03/02/23 1645 03/18/23 1112 03/23/23 1057         Exercise Goal Re-Evaluation   Exercise Goals Review Increase Physical Activity;Able to understand and use rate of perceived exertion (RPE) scale;Increase Strength and Stamina Increase Physical Activity;Able to understand and use rate of perceived exertion (RPE) scale;Increase Strength and Stamina Increase Physical Activity;Able to understand and use rate of perceived exertion (RPE) scale;Increase Strength and Stamina;Knowledge and understanding of Target Heart Rate Range (THRR);Understanding of Exercise Prescription     Comments Patient able to understand and use RPE scale appropriately. Marissa Carroll is walking in the pool 30 minutes 3 days/week in addition to exercise at cardiac rehab. Reviewed exercise prescription with Marissa Carroll. She  is walking in her outdoor pool 30 minutes 3 days/week. her goal is to know what to do to stay healthy.     Expected Outcomes Progress workloads as tolerated to help increase strength and stamina. Continue 30 minutes of aerobic exercise 6 days/week. Continue 30 minutes of aerobic exercise 6 days/week.              Discharge Exercise Prescription (Final Exercise Prescription Changes):  Exercise Prescription Changes - 03/30/23 1022       Response to Exercise   Blood Pressure (Admit) 102/68    Blood Pressure (Exercise) 130/78    Blood Pressure (Exit) 104/60    Heart Rate (Admit) 66 bpm    Heart Rate (Exercise) 96 bpm    Heart Rate (Exit) 75 bpm    Rating of  Perceived Exertion (Exercise) 11    Symptoms None    Comments Reviewed METs with patient.    Duration Continue with 30 min of aerobic exercise without signs/symptoms of physical distress.    Intensity THRR unchanged      Progression   Progression Continue to progress workloads to maintain intensity without signs/symptoms of physical distress.    Average METs 2.8      Resistance Training   Training Prescription Yes    Weight 2 lbs    Reps 10-15    Time 10 Minutes  Interval Training   Interval Training No      Recumbant Bike   Level 2    RPM 63    Watts 25    Minutes 15    METs 2.5      NuStep   Level 3    SPM 85    Minutes 15    METs 3.1      Home Exercise Plan   Plans to continue exercise at Home (comment)   Walking in pool   Frequency Add 3 additional days to program exercise sessions.    Initial Home Exercises Provided 03/23/23             Nutrition:  Target Goals: Understanding of nutrition guidelines, daily intake of sodium 1500mg , cholesterol 200mg , calories 30% from fat and 7% or less from saturated fats, daily to have 5 or more servings of fruits and vegetables.  Biometrics:  Pre Biometrics - 02/24/23 1025       Pre Biometrics   Waist Circumference 35 inches    Hip Circumference 37.5 inches    Waist to Hip Ratio 0.93 %    Triceps Skinfold 11 mm    % Body Fat 33.4 %    Grip Strength 17 kg    Flexibility 16.75 in    Single Leg Stand 2.37 seconds              Nutrition Therapy Plan and Nutrition Goals:  Nutrition Therapy & Goals - 03/30/23 1021       Nutrition Therapy   Diet Heart healthy diet    Drug/Food Interactions Statins/Certain Fruits      Personal Nutrition Goals   Nutrition Goal Patient to identify strategies for reducing cardiovascular risk by attending the [employer] education and nutrition series weekly.    Personal Goal #2 Patient to improve diet quality by using the plate method as a guide for meal planning to include  lean protein/plant protein, fruits, vegetables, whole grains, nonfat dairy as part of a well-balanced diet.    Personal Goal #3 Patient to reduce sodium to 1500mg  per day.    Comments Goals in action. Marissa Carroll continues to attend the [employer] series regularly. She continues to eat a wide variety of foods and has good understanding of reading food labels for sodium. Patient will benefit from participation in intensive cardiac rehab for nutrition, exercise, and lifestyle modification.      Intervention Plan   Intervention Prescribe, educate and counsel regarding individualized specific dietary modifications aiming towards targeted core components such as weight, hypertension, lipid management, diabetes, heart failure and other comorbidities.;Nutrition handout(s) given to patient.    Expected Outcomes Short Term Goal: Understand basic principles of dietary content, such as calories, fat, sodium, cholesterol and nutrients.;Long Term Goal: Adherence to prescribed nutrition plan.             Nutrition Assessments:  Nutrition Assessments - 03/02/23 1427       Rate Your Plate Scores   Pre Score 70            MEDIFICTS Score Key: ?70 Need to make dietary changes  40-70 Heart Healthy Diet ? 40 Therapeutic Level Cholesterol Diet   Flowsheet Row INTENSIVE CARDIAC REHAB from 03/02/2023 in [facility]  Picture Your Plate Total Score on Admission 70      Picture Your Plate Scores: <16 Unhealthy dietary pattern with much room for improvement. 41-50 Dietary pattern unlikely to meet recommendations for good  health and room for improvement. 51-60 More healthful dietary pattern, with some room for improvement.  >60 Healthy dietary pattern, although there may be some specific behaviors that could be improved.    Nutrition Goals Re-Evaluation:  Nutrition Goals Re-Evaluation     Row Name 03/02/23 1113 03/30/23 1021            Goals   Current Weight 132 lb 0.9 oz (59.9 kg) 131 lb 6.3 oz (59.6 kg)      Comment HDL 39, GFR 40, CR 1.26, BNP 331 no new labs; most recent labs HDL 39, GFR 40, CR 1.26, BNP 331      Expected Outcome Marissa Carroll reports making efforts to reduce saturated fat intake and be mindful of sodium intake 2500mg  per day at this time. She is hopeful to getting back to playing tennis regularly. Patient will benefit from participation in intensive cardiac rehab for nutrition, exercise, and lifestyle modification. Goals in action. Marissa Carroll continues to attend the [employer] series regularly. She continues to eat a wide variety of foods and has good understanding of reading food labels for sodium. Patient will benefit from participation in intensive cardiac rehab for nutrition, exercise, and lifestyle modification.               Nutrition Goals Re-Evaluation:  Nutrition Goals Re-Evaluation     Row Name 03/02/23 1113 03/30/23 1021           Goals   Current Weight 132 lb 0.9 oz (59.9 kg) 131 lb 6.3 oz (59.6 kg)      Comment HDL 39, GFR 40, CR 1.26, BNP 331 no new labs; most recent labs HDL 39, GFR 40, CR 1.26, BNP 331      Expected Outcome Marissa Carroll reports making efforts to reduce saturated fat intake and be mindful of sodium intake 2500mg  per day at this time. She is hopeful to getting back to playing tennis regularly. Patient will benefit from participation in intensive cardiac rehab for nutrition, exercise, and lifestyle modification. Goals in action. Marissa Carroll continues to attend the [employer] series regularly. She continues to eat a wide variety of foods and has good understanding of reading food labels for sodium. Patient will benefit from participation in intensive cardiac rehab for nutrition, exercise, and lifestyle modification.               Nutrition Goals Discharge (Final Nutrition Goals Re-Evaluation):  Nutrition Goals Re-Evaluation - 03/30/23 1021       Goals   Current Weight  131 lb 6.3 oz (59.6 kg)    Comment no new labs; most recent labs HDL 39, GFR 40, CR 1.26, BNP 331    Expected Outcome Goals in action. Luzmarie continues to attend the [employer] series regularly. She continues to eat a wide variety of foods and has good understanding of reading food labels for sodium. Patient will benefit from participation in intensive cardiac rehab for nutrition, exercise, and lifestyle modification.             Psychosocial: Target Goals: Acknowledge presence or absence of significant depression and/or stress, maximize coping skills, provide positive support system. Participant is able to verbalize types and ability to use techniques and skills needed for reducing stress and depression.  Initial Review & Psychosocial Screening:  Initial Psych Review & Screening - 02/24/23 1431       Initial Review   Current issues with None Identified      Family Dynamics   Good Support System? Yes  Has daughter for support     Barriers   Psychosocial barriers to participate in program There are no identifiable barriers or psychosocial needs.      Screening Interventions   Interventions Encouraged to exercise             Quality of Life Scores:  Quality of Life - 02/24/23 1432       Quality of Life   Select Quality of Life      Quality of Life Scores   Health/Function Pre 23.38 %    Socioeconomic Pre 30 %    Psych/Spiritual Pre 29.64 %    Family Pre 30 %    GLOBAL Pre 29.19 %            Scores of 19 and below usually indicate a poorer quality of life in these areas.  A difference of  2-3 points is a clinically meaningful difference.  A difference of 2-3 points in the total score of the Quality of Life Index has been associated with significant improvement in overall quality of life, self-image, physical symptoms, and general health in studies assessing change in quality of life.  PHQ-9: Review Flowsheet       02/24/2023  Depression screen PHQ 2/9   Decreased Interest 0  Down, Depressed, Hopeless 0  PHQ - 2 Score 0  Altered sleeping 0  Tired, decreased energy 0  Change in appetite 0  Feeling bad or failure about yourself  0  Trouble concentrating 0  Moving slowly or fidgety/restless 0  Suicidal thoughts 0  PHQ-9 Score 0  Difficult doing work/chores Not difficult at all   Interpretation of Total Score  Total Score Depression Severity:  1-4 = Minimal depression, 5-9 = Mild depression, 10-14 = Moderate depression, 15-19 = Moderately severe depression, 20-27 = Severe depression   Psychosocial Evaluation and Intervention:   Psychosocial Re-Evaluation:  Psychosocial Re-Evaluation     Row Name 03/02/23 1658 03/31/23 1342 03/31/23 1343         Psychosocial Re-Evaluation   Current issues with None Identified None Identified None Identified     Comments -- -- Marissa Carroll is a widow she has the support of her adult children     Interventions Encouraged to attend Cardiac Rehabilitation for the exercise Encouraged to attend Cardiac Rehabilitation for the exercise Encouraged to attend Cardiac Rehabilitation for the exercise     Continue Psychosocial Services  No Follow up required No Follow up required No Follow up required              Psychosocial Discharge (Final Psychosocial Re-Evaluation):  Psychosocial Re-Evaluation - 03/31/23 1343       Psychosocial Re-Evaluation   Current issues with None Identified    Comments Marissa Carroll is a widow she has the support of her adult children    Interventions Encouraged to attend Cardiac Rehabilitation for the exercise    Continue Psychosocial Services  No Follow up required             Vocational Rehabilitation: Provide vocational rehab assistance to qualifying candidates.   Vocational Rehab Evaluation & Intervention:  Vocational Rehab - 02/24/23 1434       Initial Vocational Rehab Evaluation & Intervention   Assessment shows need for Vocational Rehabilitation No   Pt is retired             Education: Education Goals: Education classes will be provided on a weekly basis, covering required topics. Participant will state understanding/return demonstration of topics presented.  Education     Row Name 03/02/23 1300     Education   Cardiac Education Topics [employer]   Geographical information systems officer Exercise   Exercise Workshop Location manager and Fall Prevention   Instruction Review Code 1- Verbalizes Understanding   Class Start Time 1146   Class Stop Time 1226   Class Time Calculation (min) 40 min    Row Name 03/04/23 1300     Education   Cardiac Education Topics [employer]   Customer service manager   Weekly Topic Fast and Healthy Breakfasts   Instruction Review Code 1- Verbalizes Understanding   Class Start Time 1140   Class Stop Time 1212   Class Time Calculation (min) 32 min    Row Name 03/09/23 1600     Education   Cardiac Education Topics [employer]   [employer] Workshops     Workshops   Educator Exercise Physiologist   Select Psychosocial   Psychosocial Workshop Recognizing and Reducing Stress   Instruction Review Code 1- Verbalizes Understanding   Class Start Time 1152   Class Stop Time 1234   Class Time Calculation (min) 42 min    Row Name 03/11/23 1300     Education   Cardiac Education Topics [employer]   Customer service manager   Weekly Topic Personalizing Your [employer] Plate   Instruction Review Code 1- Verbalizes Understanding   Class Start Time 1145   Class Stop Time 1220   Class Time Calculation (min) 35 min    Row Name 03/16/23 1500     Education   Cardiac Education Topics [employer]   Glass blower/designer Nutrition   Nutrition Workshop Label Reading   Instruction Review Code 1- Verbalizes Understanding   Class Start Time 1145   Class Stop Time 1221   Class  Time Calculation (min) 36 min    Row Name 03/18/23 1500     Education   Cardiac Education Topics [employer]   Customer service manager   Weekly Topic [employer] Desserts   Instruction Review Code 1- Verbalizes Understanding   Class Start Time 1145   Class Stop Time 1235   Class Time Calculation (min) 50 min    Row Name 03/23/23 1300     Education   Cardiac Education Topics --   Select --     Workshops   Educator --   Physiological scientist --   Exercise Workshop --   Instruction Review Code --   Class Start Time --   Class Stop Time --   Class Time Calculation (min) --    Row Name 03/25/23 1300     Education   Cardiac Education Topics [employer]   Customer service manager   Weekly Topic Tasty Appetizers and Snacks   Instruction Review Code 1- Verbalizes Understanding   Class Start Time 1143   Class Stop Time 1215   Class Time Calculation (min) 32 min    Row Name 03/30/23 1200     Education   Cardiac Education Topics [employer]   Licensed conveyancer Nutrition   Nutrition Calorie Density  Instruction Review Code 1- Verbalizes Understanding   Class Start Time 1145   Class Stop Time 1223   Class Time Calculation (min) 38 min            Core Videos: Exercise    Move It!  Clinical staff conducted group or individual video education with verbal and written material and guidebook.  Patient learns the recommended Pritikin exercise program. Exercise with the goal of living a long, healthy life. Some of the health benefits of exercise include controlled diabetes, healthier blood pressure levels, improved cholesterol levels, improved heart and lung capacity, improved sleep, and better body composition. Everyone should speak with their doctor before starting or changing an exercise routine.  Biomechanical Limitations Clinical staff conducted group or individual  video education with verbal and written material and guidebook.  Patient learns how biomechanical limitations can impact exercise and how we can mitigate and possibly overcome limitations to have an impactful and balanced exercise routine.  Body Composition Clinical staff conducted group or individual video education with verbal and written material and guidebook.  Patient learns that body composition (ratio of muscle mass to fat mass) is a key component to assessing overall fitness, rather than body weight alone. Increased fat mass, especially visceral belly fat, can put [place] at increased risk for metabolic syndrome, type 2 diabetes, heart disease, and even death. It is recommended to combine diet and exercise (cardiovascular and resistance training) to improve your body composition. Seek guidance from your physician and exercise physiologist before implementing an exercise routine.  Exercise Action Plan Clinical staff conducted group or individual video education with verbal and written material and guidebook.  Patient learns the recommended strategies to achieve and enjoy long-term exercise adherence, including variety, self-motivation, self-efficacy, and positive decision making. Benefits of exercise include fitness, good health, weight management, more energy, better sleep, less stress, and overall well-being.  Medical   Heart Disease Risk Reduction Clinical staff conducted group or individual video education with verbal and written material and guidebook.  Patient learns our heart is our most vital organ as it circulates oxygen, nutrients, white blood cells, and hormones throughout the entire body, and carries waste away. Data supports a plant-based eating plan like the Pritikin Program for its effectiveness in slowing progression of and reversing heart disease. The video provides a number of recommendations to address heart disease.   Metabolic Syndrome and Belly Fat  Clinical staff conducted  group or individual video education with verbal and written material and guidebook.  Patient learns what metabolic syndrome is, how it leads to heart disease, and how one can reverse it and keep it from coming back. You have metabolic syndrome if you have 3 of the following 5 criteria: abdominal obesity, high blood pressure, high triglycerides, low HDL cholesterol, and high blood sugar.  Hypertension and Heart Disease Clinical staff conducted group or individual video education with verbal and written material and guidebook.  Patient learns that high blood pressure, or hypertension, is very common in the [place]. Hypertension is largely due to excessive salt intake, but other important risk factors include being overweight, physical inactivity, drinking too much alcohol, smoking, and not eating enough potassium from fruits and vegetables. High blood pressure is a leading risk factor for heart attack, stroke, congestive heart failure, dementia, kidney failure, and premature death. Long-term effects of excessive salt intake include stiffening of the arteries and thickening of heart muscle and organ damage. Recommendations include ways to reduce hypertension and the risk of heart disease.  Diseases of Our Time - Focusing on Diabetes Clinical staff conducted group or individual video education with verbal and written material and guidebook.  Patient learns why the best way to stop diseases of our time is prevention, through food and other lifestyle changes. Medicine (such as prescription pills and surgeries) is often only a Band-Aid on the problem, not a long-term solution. Most common diseases of our time include obesity, type 2 diabetes, hypertension, heart disease, and cancer. The Pritikin Program is recommended and has been proven to help reduce, reverse, and/or prevent the damaging effects of metabolic syndrome.  Nutrition   Overview of the Pritikin Eating Plan  Clinical staff conducted group or  individual video education with verbal and written material and guidebook.  Patient learns about the Pritikin Eating Plan for disease risk reduction. The Pritikin Eating Plan emphasizes a wide variety of unrefined, minimally-processed carbohydrates, like fruits, vegetables, whole grains, and legumes. Go, Caution, and Stop food choices are explained. Plant-based and lean animal proteins are emphasized. Rationale provided for low sodium intake for blood pressure control, low added sugars for blood sugar stabilization, and low added fats and oils for coronary artery disease risk reduction and weight management.  Calorie Density  Clinical staff conducted group or individual video education with verbal and written material and guidebook.  Patient learns about calorie density and how it impacts the Pritikin Eating Plan. Knowing the characteristics of the food you choose will help you decide whether those foods will lead to weight gain or weight loss, and whether you want to consume more or less of them. Weight loss is usually a side effect of the Pritikin Eating Plan because of its focus on low calorie-dense foods.  Label Reading  Clinical staff conducted group or individual video education with verbal and written material and guidebook.  Patient learns about the Pritikin recommended label reading guidelines and corresponding recommendations regarding calorie density, added sugars, sodium content, and whole grains.  Dining Out - Part 1  Clinical staff conducted group or individual video education with verbal and written material and guidebook.  Patient learns that restaurant meals can be sabotaging because they can be so high in calories, fat, sodium, and/or sugar. Patient learns recommended strategies on how to positively address this and avoid unhealthy pitfalls.  Facts on Fats  Clinical staff conducted group or individual video education with verbal and written material and guidebook.  Patient learns  that lifestyle modifications can be just as effective, if not more so, as many medications for lowering your risk of heart disease. A Pritikin lifestyle can help to reduce your risk of inflammation and atherosclerosis (cholesterol build-up, or plaque, in the artery walls). Lifestyle interventions such as dietary choices and physical activity address the cause of atherosclerosis. A review of the types of fats and their impact on blood cholesterol levels, along with dietary recommendations to reduce fat intake is also included.  Nutrition Action Plan  Clinical staff conducted group or individual video education with verbal and written material and guidebook.  Patient learns how to incorporate Pritikin recommendations into their lifestyle. Recommendations include planning and keeping personal health goals in mind as an important part of their success.  Healthy Mind-Set    Healthy Minds, Bodies, Hearts  Clinical staff conducted group or individual video education with verbal and written material and guidebook.  Patient learns how to identify when they are stressed. Video will discuss the impact of that stress, as well as the many benefits of stress management. Patient will  also be introduced to stress management techniques. The way we think, act, and feel has an impact on our hearts.  How Our Thoughts Can Heal Our Hearts  Clinical staff conducted group or individual video education with verbal and written material and guidebook.  Patient learns that negative thoughts can cause depression and anxiety. This can result in negative lifestyle behavior and serious health problems. Cognitive behavioral therapy is an effective method to help control our thoughts in order to change and improve our emotional outlook.  Additional Videos:  Exercise    Improving Performance  Clinical staff conducted group or individual video education with verbal and written material and guidebook.  Patient learns to use a  non-linear approach by alternating intensity levels and lengths of time spent exercising to help burn more calories and lose more body fat. Cardiovascular exercise helps improve heart health, metabolism, hormonal balance, blood sugar control, and recovery from fatigue. Resistance training improves strength, endurance, balance, coordination, reaction time, metabolism, and muscle mass. Flexibility exercise improves circulation, posture, and balance. Seek guidance from your physician and exercise physiologist before implementing an exercise routine and learn your capabilities and proper form for all exercise.  Introduction to Yoga  Clinical staff conducted group or individual video education with verbal and written material and guidebook.  Patient learns about yoga, a discipline of the coming together of mind, breath, and body. The benefits of yoga include improved flexibility, improved range of motion, better posture and core strength, increased lung function, weight loss, and positive self-image. Yoga's heart health benefits include lowered blood pressure, healthier heart rate, decreased cholesterol and triglyceride levels, improved immune function, and reduced stress. Seek guidance from your physician and exercise physiologist before implementing an exercise routine and learn your capabilities and proper form for all exercise.  Medical   Aging: Enhancing Your Quality of Life  Clinical staff conducted group or individual video education with verbal and written material and guidebook.  Patient learns key strategies and recommendations to stay in good physical health and enhance quality of life, such as prevention strategies, having an advocate, securing a Health Care Proxy and Power of Attorney, and keeping a list of medications and system for tracking them. It also discusses how to avoid risk for bone loss.  Biology of Weight Control  Clinical staff conducted group or individual video education with  verbal and written material and guidebook.  Patient learns that weight gain occurs because we consume more calories than we burn (eating more, moving less). Even if your body weight is normal, you may have higher ratios of fat compared to muscle mass. Too much body fat puts you at increased risk for cardiovascular disease, heart attack, stroke, type 2 diabetes, and obesity-related cancers. In addition to exercise, following the Pritikin Eating Plan can help reduce your risk.  Decoding Lab Results  Clinical staff conducted group or individual video education with verbal and written material and guidebook.  Patient learns that lab test reflects one measurement whose values change over time and are influenced by many factors, including medication, stress, sleep, exercise, food, hydration, pre-existing medical conditions, and more. It is recommended to use the knowledge from this video to become more involved with your lab results and evaluate your numbers to speak with your doctor.   Diseases of Our Time - Overview  Clinical staff conducted group or individual video education with verbal and written material and guidebook.  Patient learns that according to the CDC, 50% to 70% of chronic diseases (such as  obesity, type 2 diabetes, elevated lipids, hypertension, and heart disease) are avoidable through lifestyle improvements including healthier food choices, listening to satiety cues, and increased physical activity.  Sleep Disorders Clinical staff conducted group or individual video education with verbal and written material and guidebook.  Patient learns how good quality and duration of sleep are important to overall health and well-being. Patient also learns about sleep disorders and how they impact health along with recommendations to address them, including discussing with a physician.  Nutrition  Dining Out - Part 2 Clinical staff conducted group or individual video education with verbal and  written material and guidebook.  Patient learns how to plan ahead and communicate in order to maximize their dining experience in a healthy and nutritious manner. Included are recommended food choices based on the type of restaurant the patient is visiting.   Fueling a Banker conducted group or individual video education with verbal and written material and guidebook.  There is a strong connection between our food choices and our health. Diseases like obesity and type 2 diabetes are very prevalent and are in large-part due to lifestyle choices. The Pritikin Eating Plan provides plenty of food and hunger-curbing satisfaction. It is easy to follow, affordable, and helps reduce health risks.  Menu Workshop  Clinical staff conducted group or individual video education with verbal and written material and guidebook.  Patient learns that restaurant meals can sabotage health goals because they are often packed with calories, fat, sodium, and sugar. Recommendations include strategies to plan ahead and to communicate with the manager, chef, or server to help order a healthier meal.  Planning Your Eating Strategy  Clinical staff conducted group or individual video education with verbal and written material and guidebook.  Patient learns about the Pritikin Eating Plan and its benefit of reducing the risk of disease. The Pritikin Eating Plan does not focus on calories. Instead, it emphasizes high-quality, nutrient-rich foods. By knowing the characteristics of the foods, we choose, we can determine their calorie density and make informed decisions.  Targeting Your Nutrition Priorities  Clinical staff conducted group or individual video education with verbal and written material and guidebook.  Patient learns that lifestyle habits have a tremendous impact on disease risk and progression. This video provides eating and physical activity recommendations based on your personal health goals,  such as reducing LDL cholesterol, losing weight, preventing or controlling type 2 diabetes, and reducing high blood pressure.  Vitamins and Minerals  Clinical staff conducted group or individual video education with verbal and written material and guidebook.  Patient learns different ways to obtain key vitamins and minerals, including through a recommended healthy diet. It is important to discuss all supplements you take with your doctor.   Healthy Mind-Set    Smoking Cessation  Clinical staff conducted group or individual video education with verbal and written material and guidebook.  Patient learns that cigarette smoking and tobacco addiction pose a serious health risk which affects millions of people. Stopping smoking will significantly reduce the risk of heart disease, lung disease, and many forms of cancer. Recommended strategies for quitting are covered, including working with your doctor to develop a successful plan.  Culinary   Becoming a Set designer conducted group or individual video education with verbal and written material and guidebook.  Patient learns that cooking at home can be healthy, cost-effective, quick, and puts them in control. Keys to cooking healthy recipes will include looking at your recipe,  assessing your equipment needs, planning ahead, making it simple, choosing cost-effective seasonal ingredients, and limiting the use of added fats, salts, and sugars.  Cooking - Breakfast and Snacks  Clinical staff conducted group or individual video education with verbal and written material and guidebook.  Patient learns how important breakfast is to satiety and nutrition through the entire day. Recommendations include key foods to eat during breakfast to help stabilize blood sugar levels and to prevent overeating at meals later in the day. Planning ahead is also a key component.  Cooking - Educational psychologist conducted group or individual video  education with verbal and written material and guidebook.  Patient learns eating strategies to improve overall health, including an approach to cook more at home. Recommendations include thinking of animal protein as a side on your plate rather than center stage and focusing instead on lower calorie dense options like vegetables, fruits, whole grains, and plant-based proteins, such as beans. Making sauces in large quantities to freeze for later and leaving the skin on your vegetables are also recommended to maximize your experience.  Cooking - Healthy Salads and Dressing Clinical staff conducted group or individual video education with verbal and written material and guidebook.  Patient learns that vegetables, fruits, whole grains, and legumes are the foundations of the Pritikin Eating Plan. Recommendations include how to incorporate each of these in flavorful and healthy salads, and how to create homemade salad dressings. Proper handling of ingredients is also covered. Cooking - Soups and [employer] - Soups and Desserts Clinical staff conducted group or individual video education with verbal and written material and guidebook.  Patient learns that Pritikin soups and desserts make for easy, nutritious, and delicious snacks and meal components that are low in sodium, fat, sugar, and calorie density, while high in vitamins, minerals, and filling fiber. Recommendations include simple and healthy ideas for soups and desserts.   Overview     The Pritikin Solution Program Overview Clinical staff conducted group or individual video education with verbal and written material and guidebook.  Patient learns that the results of the Pritikin Program have been documented in more than 100 articles published in peer-reviewed journals, and the benefits include reducing risk factors for (and, in some cases, even reversing) high cholesterol, high blood pressure, type 2 diabetes, obesity, and more! An overview of  the three key pillars of the Pritikin Program will be covered: eating well, doing regular exercise, and having a healthy mind-set.  WORKSHOPS  Exercise: Exercise Basics: Building Your Action Plan Clinical staff led group instruction and group discussion with PowerPoint presentation and patient guidebook. To enhance the learning environment the use of posters, models and videos may be added. At the conclusion of this workshop, patients will comprehend the difference between physical activity and exercise, as well as the benefits of incorporating both, into their routine. Patients will understand the FITT (Frequency, Intensity, Time, and Type) principle and how to use it to build an exercise action plan. In addition, safety concerns and other considerations for exercise and cardiac rehab will be addressed by the presenter. The purpose of this lesson is to promote a comprehensive and effective weekly exercise routine in order to improve patients' overall level of fitness.   Managing Heart Disease: Your Path to a Healthier Heart Clinical staff led group instruction and group discussion with PowerPoint presentation and patient guidebook. To enhance the learning environment the use of posters, models and videos may be added.At the conclusion of  this workshop, patients will understand the anatomy and physiology of the heart. Additionally, they will understand how Pritikin's three pillars impact the risk factors, the progression, and the management of heart disease.  The purpose of this lesson is to provide a high-level overview of the heart, heart disease, and how the Pritikin lifestyle positively impacts risk factors.  Exercise Biomechanics Clinical staff led group instruction and group discussion with PowerPoint presentation and patient guidebook. To enhance the learning environment the use of posters, models and videos may be added. Patients will learn how the structural parts of their bodies  function and how these functions impact their daily activities, movement, and exercise. Patients will learn how to promote a neutral spine, learn how to manage pain, and identify ways to improve their physical movement in order to promote healthy living. The purpose of this lesson is to expose patients to common physical limitations that impact physical activity. Participants will learn practical ways to adapt and manage aches and pains, and to minimize their effect on regular exercise. Patients will learn how to maintain good posture while sitting, walking, and lifting.  Balance Training and Fall Prevention  Clinical staff led group instruction and group discussion with PowerPoint presentation and patient guidebook. To enhance the learning environment the use of posters, models and videos may be added. At the conclusion of this workshop, patients will understand the importance of their sensorimotor skills (vision, proprioception, and the vestibular system) in maintaining their ability to balance as they age. Patients will apply a variety of balancing exercises that are appropriate for their current level of function. Patients will understand the common causes for poor balance, possible solutions to these problems, and ways to modify their physical environment in order to minimize their fall risk. The purpose of this lesson is to teach patients about the importance of maintaining balance as they age and ways to minimize their risk of falling.  WORKSHOPS   Nutrition:  Fueling a Ship broker led group instruction and group discussion with PowerPoint presentation and patient guidebook. To enhance the learning environment the use of posters, models and videos may be added. Patients will review the foundational principles of the Pritikin Eating Plan and understand what constitutes a serving size in each of the food groups. Patients will also learn Pritikin-friendly foods that are better  choices when away from home and review make-ahead meal and snack options. Calorie density will be reviewed and applied to three nutrition priorities: weight maintenance, weight loss, and weight gain. The purpose of this lesson is to reinforce (in a group setting) the key concepts around what patients are recommended to eat and how to apply these guidelines when away from home by planning and selecting Pritikin-friendly options. Patients will understand how calorie density may be adjusted for different weight management goals.  Mindful Eating  Clinical staff led group instruction and group discussion with PowerPoint presentation and patient guidebook. To enhance the learning environment the use of posters, models and videos may be added. Patients will briefly review the concepts of the Pritikin Eating Plan and the importance of low-calorie dense foods. The concept of mindful eating will be introduced as well as the importance of paying attention to internal hunger signals. Triggers for non-hunger eating and techniques for dealing with triggers will be explored. The purpose of this lesson is to provide patients with the opportunity to review the basic principles of the Pritikin Eating Plan, discuss the value of eating mindfully and how to measure internal  cues of hunger and fullness using the Hunger Scale. Patients will also discuss reasons for non-hunger eating and learn strategies to use for controlling emotional eating.  Targeting Your Nutrition Priorities Clinical staff led group instruction and group discussion with PowerPoint presentation and patient guidebook. To enhance the learning environment the use of posters, models and videos may be added. Patients will learn how to determine their genetic susceptibility to disease by reviewing their family history. Patients will gain insight into the importance of diet as part of an overall healthy lifestyle in mitigating the impact of genetics and other  environmental insults. The purpose of this lesson is to provide patients with the opportunity to assess their personal nutrition priorities by looking at their family history, their own health history and current risk factors. Patients will also be able to discuss ways of prioritizing and modifying the Pritikin Eating Plan for their highest risk areas  Menu  Clinical staff led group instruction and group discussion with PowerPoint presentation and patient guidebook. To enhance the learning environment the use of posters, models and videos may be added. Using menus brought in from [employer], or printed from [employer], patients will apply the [facility] dining out guidelines that were presented in the [employer] video. Patients will also be able to practice these guidelines in a variety of provided scenarios. The purpose of this lesson is to provide patients with the opportunity to practice hands-on learning of the [facility] Dining Out guidelines with actual menus and practice scenarios.  Label Reading Clinical staff led group instruction and group discussion with PowerPoint presentation and patient guidebook. To enhance the learning environment the use of posters, models and videos may be added. Patients will review and discuss the [facility] label reading guidelines presented in [employer]'s Label Reading Educational series video. Using fool labels brought in from local grocery stores and markets, patients will apply the label reading guidelines and determine if the packaged food meet the [facility] guidelines. The purpose of this lesson is to provide patients with the opportunity to review, discuss, and practice hands-on learning of the [facility] Label Reading guidelines with actual packaged food labels. Cooking School  [employer]'s [employer] are designed to teach patients ways to prepare quick, simple, and affordable recipes at home. The importance of nutrition's role in  chronic disease risk reduction is reflected in its emphasis in the overall [facility] program. By learning how to prepare essential core [facility] Eating Plan recipes, patients will increase control over what they eat; be able to customize the flavor of foods without the use of added salt, sugar, or fat; and improve the quality of the food they consume. By learning a set of core recipes which are easily assembled, quickly prepared, and affordable, patients are more likely to prepare more healthy foods at home. These workshops focus on convenient breakfasts, simple entres, side dishes, and desserts which can be prepared with minimal effort and are consistent with nutrition recommendations for cardiovascular risk reduction. Cooking [employer] are taught by a Armed forces logistics/support/administrative officer (RD) who has been trained by the [employer]. The chef or RD has a clear understanding of the importance of minimizing - if not completely eliminating - added fat, sugar, and sodium in recipes. Throughout the series of Cooking School Workshop sessions, patients will learn about healthy ingredients and efficient methods of cooking to build confidence in their capability to prepare    Cooking School weekly topics:  Adding Flavor- Sodium-Free  Fast  and Healthy Breakfasts  Powerhouse Plant-Based Proteins  Satisfying Salads and Dressings  Simple Sides and Sauces  International Cuisine-Spotlight on the Blue Zones  Delicious Desserts  Savory Soups  Efficiency Cooking - Meals in a Snap  Tasty Appetizers and Snacks  Comforting Weekend Breakfasts  One-Pot Wonders   Fast Evening Meals  Landscape architect Your Pritikin Plate  WORKSHOPS   Healthy Mindset (Psychosocial):  Focused Goals, Sustainable Changes Clinical staff led group instruction and group discussion with PowerPoint presentation and patient guidebook. To enhance the learning environment the use of posters, models and videos may  be added. Patients will be able to apply effective goal setting strategies to establish at least one personal goal, and then take consistent, meaningful action toward that goal. They will learn to identify common barriers to achieving personal goals and develop strategies to overcome them. Patients will also gain an understanding of how our mind-set can impact our ability to achieve goals and the importance of cultivating a positive and growth-oriented mind-set. The purpose of this lesson is to provide patients with a deeper understanding of how to set and achieve personal goals, as well as the tools and strategies needed to overcome common obstacles which may arise along the way.  From Head to Heart: The Power of a Healthy Outlook  Clinical staff led group instruction and group discussion with PowerPoint presentation and patient guidebook. To enhance the learning environment the use of posters, models and videos may be added. Patients will be able to recognize and describe the impact of emotions and mood on physical health. They will discover the importance of self-care and explore self-care practices which may work for them. Patients will also learn how to utilize the 4 C's to cultivate a healthier outlook and better manage stress and challenges. The purpose of this lesson is to demonstrate to patients how a healthy outlook is an essential part of maintaining good health, especially as they continue their cardiac rehab journey.  Healthy Sleep for a Healthy Heart Clinical staff led group instruction and group discussion with PowerPoint presentation and patient guidebook. To enhance the learning environment the use of posters, models and videos may be added. At the conclusion of this workshop, patients will be able to demonstrate knowledge of the importance of sleep to overall health, well-being, and quality of life. They will understand the symptoms of, and treatments for, common sleep disorders. Patients  will also be able to identify daytime and nighttime behaviors which impact sleep, and they will be able to apply these tools to help manage sleep-related challenges. The purpose of this lesson is to provide patients with a general overview of sleep and outline the importance of quality sleep. Patients will learn about a few of the most common sleep disorders. Patients will also be introduced to the concept of "sleep hygiene," and discover ways to self-manage certain sleeping problems through simple daily behavior changes. Finally, the workshop will motivate patients by clarifying the links between quality sleep and their goals of heart-healthy living.   Recognizing and Reducing Stress Clinical staff led group instruction and group discussion with PowerPoint presentation and patient guidebook. To enhance the learning environment the use of posters, models and videos may be added. At the conclusion of this workshop, patients will be able to understand the types of stress reactions, differentiate between acute and chronic stress, and recognize the impact that chronic stress has on their health. They will also be able to apply different coping mechanisms, such as  reframing negative self-talk. Patients will have the opportunity to practice a variety of stress management techniques, such as deep abdominal breathing, progressive muscle relaxation, and/or guided imagery.  The purpose of this lesson is to educate patients on the role of stress in their lives and to provide healthy techniques for coping with it.  Learning Barriers/Preferences:  Learning Barriers/Preferences - 02/24/23 1434       Learning Barriers/Preferences   Learning Barriers Hearing   bilateral hearing aids   Learning Preferences Written Material;Computer/Internet             Education Topics:  Knowledge Questionnaire Score:  Knowledge Questionnaire Score - 02/24/23 1433       Knowledge Questionnaire Score   Pre Score 16/24              Core Components/Risk Factors/Patient Goals at Admission:  Personal Goals and Risk Factors at Admission - 02/24/23 1433       Core Components/Risk Factors/Patient Goals on Admission    Weight Management Weight Maintenance;Yes    Expected Outcomes Long Term: Adherence to nutrition and physical activity/exercise program aimed toward attainment of established weight goal;Weight Maintenance: Understanding of the daily nutrition guidelines, which includes 25-35% calories from fat, 7% or less cal from saturated fats, less than 200mg  cholesterol, less than 1.5gm of sodium, & 5 or more servings of fruits and vegetables daily;Understanding recommendations for meals to include 15-35% energy as protein, 25-35% energy from fat, 35-60% energy from carbohydrates, less than 200mg  of dietary cholesterol, 20-35 gm of total fiber daily;Understanding of distribution of calorie intake throughout the day with the consumption of 4-5 meals/snacks    Heart Failure Yes    Intervention Provide a combined exercise and nutrition program that is supplemented with education, support and counseling about heart failure. Directed toward relieving symptoms such as shortness of breath, decreased exercise tolerance, and extremity edema.    Expected Outcomes Improve functional capacity of life;Short term: Attendance in program 2-3 days a week with increased exercise capacity. Reported lower sodium intake. Reported increased fruit and vegetable intake. Reports medication compliance.;Short term: Daily weights obtained and reported for increase. Utilizing diuretic protocols set by physician.;Long term: Adoption of self-care skills and reduction of barriers for early signs and symptoms recognition and intervention leading to self-care maintenance.    Hypertension Yes    Intervention Provide education on lifestyle modifcations including regular physical activity/exercise, weight management, moderate sodium restriction and increased  consumption of fresh fruit, vegetables, and low fat dairy, alcohol moderation, and smoking cessation.;Monitor prescription use compliance.    Expected Outcomes Short Term: Continued assessment and intervention until BP is < 140/76mm HG in hypertensive participants. < 130/66mm HG in hypertensive participants with diabetes, heart failure or chronic kidney disease.;Long Term: Maintenance of blood pressure at goal levels.    Lipids Yes    Intervention Provide education and support for participant on nutrition & aerobic/resistive exercise along with prescribed medications to achieve LDL 70mg , HDL >40mg .    Expected Outcomes Short Term: Participant states understanding of desired cholesterol values and is compliant with medications prescribed. Participant is following exercise prescription and nutrition guidelines.;Long Term: Cholesterol controlled with medications as prescribed, with individualized exercise RX and with personalized nutrition plan. Value goals: LDL < 70mg , HDL > 40 mg.             Core Components/Risk Factors/Patient Goals Review:   Goals and Risk Factor Review     Row Name 03/02/23 1659 03/31/23 1343  Core Components/Risk Factors/Patient Goals Review   Personal Goals Review Weight Management/Obesity;Heart Failure;Hypertension;Lipids Weight Management/Obesity;Heart Failure;Hypertension;Lipids      Review Marissa Carroll started intensive cardiac rehab on 03/02/23 and did well with exercise, Vital signs were stable Marissa Carroll is doing  well with exercise, Vital signs have been stable. Marissa Carroll is enjoying participating in cardiac rehab. Marissa Carroll water walks in the pool on the days she does not attend cardiac rehab      Expected Outcomes Marissa Carroll will continue to participate in intensive cardiac rehab for exercise, nutrition and lifestyle modifications Marissa Carroll will continue to participate in intensive cardiac rehab for exercise, nutrition and lifestyle modifications               Core  Components/Risk Factors/Patient Goals at Discharge (Final Review):   Goals and Risk Factor Review - 03/31/23 1343       Core Components/Risk Factors/Patient Goals Review   Personal Goals Review Weight Management/Obesity;Heart Failure;Hypertension;Lipids    Review Marissa Carroll is doing  well with exercise, Vital signs have been stable. Chloeann is enjoying participating in cardiac rehab. Margrette water walks in the pool on the days she does not attend cardiac rehab    Expected Outcomes Khylei will continue to participate in intensive cardiac rehab for exercise, nutrition and lifestyle modifications             ITP Comments:  ITP Comments     Row Name 02/24/23 1047 03/02/23 1150 03/31/23 1339       ITP Comments Introduction to [facility] Education Program/ Intensive Cardiac Rehab. Initial Orientation Packet reviewed with the patient. 30 Day ITP Review. Narah  started intensive cardiac rehab on 03/02/23 and did well with exercise 30 Day ITP Review. Hermione has good attendance and participation in intensive cardiac rehab              Comments: See ITP Comments

## 2023-04-01 ENCOUNTER — Encounter (HOSPITAL_COMMUNITY): Payer: Medicare Other

## 2023-04-03 ENCOUNTER — Encounter (HOSPITAL_COMMUNITY): Payer: Medicare Other

## 2023-04-06 ENCOUNTER — Encounter (HOSPITAL_COMMUNITY)
Admission: RE | Admit: 2023-04-06 | Discharge: 2023-04-06 | Disposition: A | Discharge status: Home / Self Care | Payer: Medicare Other | Source: Ambulatory Visit | Attending: Cardiology | Admitting: Cardiology

## 2023-04-06 DIAGNOSIS — D0439 Carcinoma in situ of skin of other parts of face: Secondary | ICD-10-CM | POA: Diagnosis not present

## 2023-04-06 DIAGNOSIS — I214 Non-ST elevation (NSTEMI) myocardial infarction: Secondary | ICD-10-CM | POA: Diagnosis not present

## 2023-04-06 DIAGNOSIS — Z955 Presence of coronary angioplasty implant and graft: Secondary | ICD-10-CM | POA: Diagnosis not present

## 2023-04-06 DIAGNOSIS — D485 Neoplasm of uncertain behavior of skin: Secondary | ICD-10-CM | POA: Diagnosis not present

## 2023-04-06 DIAGNOSIS — Z85828 Personal history of other malignant neoplasm of skin: Secondary | ICD-10-CM | POA: Diagnosis not present

## 2023-04-08 ENCOUNTER — Encounter (HOSPITAL_COMMUNITY)
Admission: RE | Admit: 2023-04-08 | Discharge: 2023-04-08 | Disposition: A | Discharge status: Home / Self Care | Payer: Medicare Other | Source: Ambulatory Visit | Attending: Cardiology | Admitting: Cardiology

## 2023-04-08 DIAGNOSIS — Z955 Presence of coronary angioplasty implant and graft: Secondary | ICD-10-CM

## 2023-04-08 DIAGNOSIS — I214 Non-ST elevation (NSTEMI) myocardial infarction: Secondary | ICD-10-CM

## 2023-04-10 ENCOUNTER — Encounter (HOSPITAL_COMMUNITY)
Admission: RE | Admit: 2023-04-10 | Discharge: 2023-04-10 | Disposition: A | Discharge status: Home / Self Care | Payer: Medicare Other | Source: Ambulatory Visit | Attending: Cardiology | Admitting: Cardiology

## 2023-04-10 DIAGNOSIS — I214 Non-ST elevation (NSTEMI) myocardial infarction: Secondary | ICD-10-CM | POA: Diagnosis not present

## 2023-04-10 DIAGNOSIS — Z955 Presence of coronary angioplasty implant and graft: Secondary | ICD-10-CM | POA: Diagnosis not present

## 2023-04-13 ENCOUNTER — Encounter (HOSPITAL_COMMUNITY)
Admission: RE | Admit: 2023-04-13 | Discharge: 2023-04-13 | Disposition: A | Discharge status: Home / Self Care | Payer: Medicare Other | Source: Ambulatory Visit | Attending: Cardiology | Admitting: Cardiology

## 2023-04-13 DIAGNOSIS — Z955 Presence of coronary angioplasty implant and graft: Secondary | ICD-10-CM

## 2023-04-13 DIAGNOSIS — I214 Non-ST elevation (NSTEMI) myocardial infarction: Secondary | ICD-10-CM | POA: Diagnosis not present

## 2023-04-15 ENCOUNTER — Encounter (HOSPITAL_COMMUNITY)
Admission: RE | Admit: 2023-04-15 | Discharge: 2023-04-15 | Disposition: A | Discharge status: Home / Self Care | Payer: Medicare Other | Source: Ambulatory Visit | Attending: Cardiology | Admitting: Cardiology

## 2023-04-15 DIAGNOSIS — I214 Non-ST elevation (NSTEMI) myocardial infarction: Secondary | ICD-10-CM | POA: Diagnosis not present

## 2023-04-15 DIAGNOSIS — Z955 Presence of coronary angioplasty implant and graft: Secondary | ICD-10-CM

## 2023-04-17 ENCOUNTER — Encounter (HOSPITAL_COMMUNITY): Payer: Medicare Other

## 2023-04-17 ENCOUNTER — Telehealth (HOSPITAL_COMMUNITY): Payer: Self-pay

## 2023-04-20 ENCOUNTER — Encounter (HOSPITAL_COMMUNITY)
Admission: RE | Admit: 2023-04-20 | Discharge: 2023-04-20 | Disposition: A | Discharge status: Home / Self Care | Payer: Medicare Other | Source: Ambulatory Visit | Attending: Cardiology | Admitting: Cardiology

## 2023-04-20 DIAGNOSIS — I214 Non-ST elevation (NSTEMI) myocardial infarction: Secondary | ICD-10-CM

## 2023-04-20 DIAGNOSIS — Z955 Presence of coronary angioplasty implant and graft: Secondary | ICD-10-CM

## 2023-04-22 ENCOUNTER — Encounter (HOSPITAL_COMMUNITY)
Admission: RE | Admit: 2023-04-22 | Discharge: 2023-04-22 | Disposition: A | Discharge status: Home / Self Care | Payer: Medicare Other | Source: Ambulatory Visit | Attending: Cardiology | Admitting: Cardiology

## 2023-04-22 DIAGNOSIS — Z955 Presence of coronary angioplasty implant and graft: Secondary | ICD-10-CM

## 2023-04-22 DIAGNOSIS — I214 Non-ST elevation (NSTEMI) myocardial infarction: Secondary | ICD-10-CM | POA: Diagnosis not present

## 2023-04-24 ENCOUNTER — Encounter (HOSPITAL_COMMUNITY)
Admission: RE | Admit: 2023-04-24 | Discharge: 2023-04-24 | Disposition: A | Discharge status: Home / Self Care | Payer: Medicare Other | Source: Ambulatory Visit | Attending: Cardiology | Admitting: Cardiology

## 2023-04-24 DIAGNOSIS — Z955 Presence of coronary angioplasty implant and graft: Secondary | ICD-10-CM | POA: Diagnosis not present

## 2023-04-24 DIAGNOSIS — I214 Non-ST elevation (NSTEMI) myocardial infarction: Secondary | ICD-10-CM | POA: Diagnosis not present

## 2023-04-27 ENCOUNTER — Encounter (HOSPITAL_COMMUNITY)
Admission: RE | Admit: 2023-04-27 | Discharge: 2023-04-27 | Disposition: A | Discharge status: Home / Self Care | Payer: Medicare Other | Source: Ambulatory Visit | Attending: Cardiology | Admitting: Cardiology

## 2023-04-27 DIAGNOSIS — Z955 Presence of coronary angioplasty implant and graft: Secondary | ICD-10-CM

## 2023-04-27 DIAGNOSIS — I214 Non-ST elevation (NSTEMI) myocardial infarction: Secondary | ICD-10-CM

## 2023-04-27 NOTE — Progress Notes (Incomplete)
Cardiac Individual Treatment Plan  Patient Details  Name: Marissa Carroll MRN: [id number] Date of Birth: [birth year] Referring Provider:   Flowsheet Row INTENSIVE CARDIAC REHAB ORIENT from 02/24/2023 in [facility] for [facility], Vascular, & Lung [facility]  Referring Provider [name], MD       Initial Encounter Date:  Flowsheet Row INTENSIVE CARDIAC REHAB ORIENT from 02/24/2023 in [facility] for Heart, Vascular, & Lung Health  Date 02/24/23       Visit Diagnosis: 11/14/22 NSTEMI (non-ST elevated myocardial infarction) (HCC)  11/14/22 Status post coronary artery stent placement  Patient's Home Medications on Admission:  Current Outpatient Medications:    apixaban (ELIQUIS) 5 MG TABS tablet, Take 1 tablet (5 mg total) by mouth 2 (two) times daily., Disp: 60 tablet, Rfl: 11   atorvastatin (LIPITOR) 40 MG tablet, Take 1 tablet (40 mg total) by mouth daily., Disp: 90 tablet, Rfl: 3   calcium-vitamin D (OSCAL WITH D) 500-5 MG-MCG tablet, Take 1 tablet by mouth., Disp: , Rfl:    carvedilol (COREG) 3.125 MG tablet, Take 1 tablet (3.125 mg total) by mouth 2 (two) times daily with a meal., Disp: 60 tablet, Rfl: 11   clopidogrel (PLAVIX) 75 MG tablet, Take 1 tablet (75 mg total) by mouth daily., Disp: 30 tablet, Rfl: 11   dapagliflozin propanediol (FARXIGA) 10 MG TABS tablet, Take 1 tablet (10 mg total) by mouth daily before breakfast., Disp: 30 tablet, Rfl: 11   diazepam (VALIUM) 5 MG tablet, As needed (Patient not taking: Reported on 03/17/2023), Disp: , Rfl:    furosemide (LASIX) 40 MG tablet, Take 1 tablet (40 mg total) by mouth daily., Disp: 90 tablet, Rfl: 3   Multiple Vitamins-Minerals (EYE VITAMINS PO), Take 1 tablet by mouth daily. , Disp: , Rfl:    nitroGLYCERIN (NITROSTAT) 0.4 MG SL tablet, Place 1 tablet (0.4 mg total) under the tongue every 5 (five) minutes as needed for chest pain. (Patient not taking: Reported on 03/17/2023), Disp: 90  tablet, Rfl: 3   spironolactone (ALDACTONE) 25 MG tablet, Take 1 tablet (25 mg total) by mouth daily., Disp: 90 tablet, Rfl: 3   valsartan (DIOVAN) 40 MG tablet, Take 0.5 tablets (20 mg total) by mouth 2 (two) times daily., Disp: 90 tablet, Rfl: 3  Past Medical History: Past Medical History:  Diagnosis Date   CHF (congestive heart failure) (HCC)    Coronary artery disease    History of breast cancer    Hyperlipidemia    Hypertension     Tobacco Use: Social History   Tobacco Use  Smoking Status Never  Smokeless Tobacco Never    Labs: Review Flowsheet       Latest Ref Rng & Units 11/27/2022 02/13/2023  Labs for ITP Cardiac and Pulmonary Rehab  Cholestrol 0 - 200 mg/dL 213  086   LDL (calc) 0 - 99 mg/dL 69  50   HDL-C >57 mg/dL 36  39   Trlycerides <846 mg/dL 94  87     Details            Capillary Blood Glucose: No results found for: "GLUCAP"   Exercise Target Goals: Exercise Program Goal: Individual exercise prescription set using results from initial 6 min walk test and THRR while considering  patient's activity barriers and safety.   Exercise Prescription Goal: Initial exercise prescription builds to 30-45 minutes a day of aerobic activity, 2-3 days per week.  Home exercise guidelines will be given to patient during  program as part of exercise prescription that the participant will acknowledge.  Activity Barriers & Risk Stratification:  Activity Barriers & Cardiac Risk Stratification - 02/24/23 1427       Activity Barriers & Cardiac Risk Stratification   Activity Barriers Joint Problems;Deconditioning;Muscular Weakness;Decreased Ventricular Function;Balance Concerns;Arthritis    Cardiac Risk Stratification High             6 Minute Walk:  6 Minute Walk     Row Name 02/24/23 1143         6 Minute Walk   Phase Initial     Distance 1274 feet     Walk Time 6 minutes     # of Rest Breaks 0     MPH 2.41     METS 1.7     RPE 11     Perceived  Dyspnea  0     VO2 Peak 5.8     Symptoms Yes (comment)     Comments Bilateral Hip pain 3/10     Resting HR 64 bpm     Resting BP 88/50     Resting Oxygen Saturation  99 %     Exercise Oxygen Saturation  during 6 min walk 100 %     Max Ex. HR 83 bpm     Max Ex. BP 126/70     2 Minute Post BP 110/68              Oxygen Initial Assessment:   Oxygen Re-Evaluation:   Oxygen Discharge (Final Oxygen Re-Evaluation):   Initial Exercise Prescription:  Initial Exercise Prescription - 02/24/23 1400       Date of Initial Exercise RX and Referring Provider   Date 02/24/23    Referring Provider [name], MD    Expected Discharge Date 05/08/23      Recumbant Bike   Level 1    RPM 60    Watts 25    Minutes 15    METs 1.7      NuStep   Level 2    SPM 75    Minutes 15    METs 1.7      Prescription Details   Frequency (times per week) 3    Duration Progress to 30 minutes of continuous aerobic without signs/symptoms of physical distress      Intensity   THRR 40-80% of Max Heartrate 51-102    Ratings of Perceived Exertion 11-13    Perceived Dyspnea 0-4      Progression   Progression Continue progressive overload as per policy without signs/symptoms or physical distress.      Resistance Training   Training Prescription Yes    Weight 2 lbs    Reps 10-15             Perform Capillary Blood Glucose checks as needed.  Exercise Prescription Changes:   Exercise Prescription Changes     Row Name 03/02/23 1018 03/16/23 1020 03/30/23 1022 04/13/23 1020 04/27/23 1024     Response to Exercise   Blood Pressure (Admit) 110/60 112/60 102/68 102/58 110/58   Blood Pressure (Exercise) 120/70 110/58 130/78 114/78 142/66   Blood Pressure (Exit) 110/64 104/70 104/60 102/64 102/62   Heart Rate (Admit) 64 bpm 62 bpm 66 bpm 62 bpm 62 bpm   Heart Rate (Exercise) 85 bpm 89 bpm 96 bpm 95 bpm 92 bpm   Heart Rate (Exit) 64 bpm 71 bpm 75 bpm 66 bpm 66 bpm   Rating of Perceived  Exertion (Exercise)  13 11 11 11 11    Symptoms None None None None None   Comments Off to a good start with exercise. -- Reviewed METs with patient. -- Reviewed METs   Duration Continue with 30 min of aerobic exercise without signs/symptoms of physical distress. Continue with 30 min of aerobic exercise without signs/symptoms of physical distress. Continue with 30 min of aerobic exercise without signs/symptoms of physical distress. Continue with 30 min of aerobic exercise without signs/symptoms of physical distress. Continue with 30 min of aerobic exercise without signs/symptoms of physical distress.   Intensity THRR unchanged THRR unchanged THRR unchanged THRR unchanged THRR unchanged     Progression   Progression Continue to progress workloads to maintain intensity without signs/symptoms of physical distress. Continue to progress workloads to maintain intensity without signs/symptoms of physical distress. Continue to progress workloads to maintain intensity without signs/symptoms of physical distress. Continue to progress workloads to maintain intensity without signs/symptoms of physical distress. Continue to progress workloads to maintain intensity without signs/symptoms of physical distress.   Average METs 2 2.3 2.8 2.8 3     Resistance Training   Training Prescription Yes Yes Yes Yes Yes   Weight 2 lbs 2 lbs 2 lbs 3 lbs 3 lbs   Reps 10-15 10-15 10-15 10-15 10-15   Time 10 Minutes 10 Minutes 10 Minutes 10 Minutes 10 Minutes     Interval Training   Interval Training No No No No No     Recumbant Bike   Level 1 1 2 2 2    RPM 65 58 63 68 65   Watts -- 14 25 19 22    Minutes 15 15 15 15 15    METs 2.2 2 2.5 2.4 2.6     NuStep   Level 2 2 3 3 3    SPM 68 84 85 90 91   Minutes 15 15 15 15 15    METs 1.9 2.7 3.1 3.2 3.5     Home Exercise Plan   Plans to continue exercise at -- -- Home (comment)  Walking in pool Home (comment)  Walking in pool Home (comment)  Walking in pool   Frequency -- --  Add 3 additional days to program exercise sessions. Add 3 additional days to program exercise sessions. Add 3 additional days to program exercise sessions.   Initial Home Exercises Provided -- -- 03/23/23 03/23/23 03/23/23            Exercise Comments:   Exercise Comments     Row Name 03/02/23 1130 03/18/23 1112 03/23/23 1057 03/30/23 1115 04/15/23 1103   Exercise Comments Patient tolerated low intensity exercise well without symptoms. Reviewed METs and goals with patient. Reviewed home exercise guidelines and goals with patient. Reviewed METs with patient. Reviewed METs with patient.    Row Name 04/22/23 1050 04/24/23 1111 04/27/23 1048       Exercise Comments Reviewed goals with patient. Reviewed METs with patient. Reviewed METs with patient.              Exercise Goals and Review:   Exercise Goals     Row Name 02/24/23 1429             Exercise Goals   Increase Physical Activity Yes       Intervention Provide advice, education, support and counseling about physical activity/exercise needs.;Develop an individualized exercise prescription for aerobic and resistive training based on initial evaluation findings, risk stratification, comorbidities and participant's personal goals.       Expected Outcomes Short  Term: Attend rehab on a regular basis to increase amount of physical activity.;Long Term: Add in home exercise to make exercise part of routine and to increase amount of physical activity.;Long Term: Exercising regularly at least 3-5 days a week.       Increase Strength and Stamina Yes       Intervention Provide advice, education, support and counseling about physical activity/exercise needs.;Develop an individualized exercise prescription for aerobic and resistive training based on initial evaluation findings, risk stratification, comorbidities and participant's personal goals.       Expected Outcomes Short Term: Increase workloads from initial exercise prescription  for resistance, speed, and METs.;Short Term: Perform resistance training exercises routinely during rehab and add in resistance training at home;Long Term: Improve cardiorespiratory fitness, muscular endurance and strength as measured by increased METs and functional capacity ( )       Able to understand and use rate of perceived exertion (RPE) scale Yes       Intervention Provide education and explanation on how to use RPE scale       Expected Outcomes Short Term: Able to use RPE daily in rehab to express subjective intensity level;Long Term:  Able to use RPE to guide intensity level when exercising independently       Knowledge and understanding of Target Heart Rate Range (THRR) Yes       Intervention Provide education and explanation of THRR including how the numbers were predicted and where they are located for reference       Expected Outcomes Short Term: Able to state/look up THRR;Short Term: Able to use daily as guideline for intensity in rehab;Long Term: Able to use THRR to govern intensity when exercising independently       Understanding of Exercise Prescription Yes       Intervention Provide education, explanation, and written materials on patient's individual exercise prescription       Expected Outcomes Short Term: Able to explain program exercise prescription;Long Term: Able to explain home exercise prescription to exercise independently                Exercise Goals Re-Evaluation :  Exercise Goals Re-Evaluation     Row Name 03/02/23 1645 03/18/23 1112 03/23/23 1057 04/22/23 1050       Exercise Goal Re-Evaluation   Exercise Goals Review Increase Physical Activity;Able to understand and use rate of perceived exertion (RPE) scale;Increase Strength and Stamina Increase Physical Activity;Able to understand and use rate of perceived exertion (RPE) scale;Increase Strength and Stamina Increase Physical Activity;Able to understand and use rate of perceived exertion (RPE)  scale;Increase Strength and Stamina;Knowledge and understanding of Target Heart Rate Range (THRR);Understanding of Exercise Prescription Increase Physical Activity;Able to understand and use rate of perceived exertion (RPE) scale;Increase Strength and Stamina;Knowledge and understanding of Target Heart Rate Range (THRR);Understanding of Exercise Prescription    Comments Patient able to understand and use RPE scale appropriately. Marissa Carroll is walking in the pool 30 minutes 3 days/week in addition to exercise at cardiac rehab. Reviewed exercise prescription with Marissa Carroll. She  is walking in her outdoor pool 30 minutes 3 days/week. her goal is to know what to do to stay healthy. Marissa Carroll is making steady progess with exercise. She is consistently walking in her pool in addition to exercise at cardiac rehab. She plans to resume walking on the street in addition to walking in the pool, weather permitting. She still gets tired and sits for 30 minutes and is ready to move again. She recently increased her  hand weights at cardiac rehab from 2 to 3 lbs.    Expected Outcomes Progress workloads as tolerated to help increase strength and stamina. Continue 30 minutes of aerobic exercise 6 days/week. Continue 30 minutes of aerobic exercise 6 days/week. Progress workloads as tolerated. Add street walking to pool walking at home.             Discharge Exercise Prescription (Final Exercise Prescription Changes):  Exercise Prescription Changes - 04/27/23 1024       Response to Exercise   Blood Pressure (Admit) 110/58    Blood Pressure (Exercise) 142/66    Blood Pressure (Exit) 102/62    Heart Rate (Admit) 62 bpm    Heart Rate (Exercise) 92 bpm    Heart Rate (Exit) 66 bpm    Rating of Perceived Exertion (Exercise) 11    Symptoms None    Comments Reviewed METs    Duration Continue with 30 min of aerobic exercise without signs/symptoms of physical distress.    Intensity THRR unchanged      Progression   Progression  Continue to progress workloads to maintain intensity without signs/symptoms of physical distress.    Average METs 3      Resistance Training   Training Prescription Yes    Weight 3 lbs    Reps 10-15    Time 10 Minutes      Interval Training   Interval Training No      Recumbant Bike   Level 2    RPM 65    Watts 22    Minutes 15    METs 2.6      NuStep   Level 3    SPM 91    Minutes 15    METs 3.5      Home Exercise Plan   Plans to continue exercise at Home (comment)   Walking in pool   Frequency Add 3 additional days to program exercise sessions.    Initial Home Exercises Provided 03/23/23             Nutrition:  Target Goals: Understanding of nutrition guidelines, daily intake of sodium 1500mg , cholesterol 200mg , calories 30% from fat and 7% or less from saturated fats, daily to have 5 or more servings of fruits and vegetables.  Biometrics:  Pre Biometrics - 02/24/23 1025       Pre Biometrics   Waist Circumference 35 inches    Hip Circumference 37.5 inches    Waist to Hip Ratio 0.93 %    Triceps Skinfold 11 mm    % Body Fat 33.4 %    Grip Strength 17 kg    Flexibility 16.75 in    Single Leg Stand 2.37 seconds              Nutrition Therapy Plan and Nutrition Goals:  Nutrition Therapy & Goals - 03/30/23 1021       Nutrition Therapy   Diet Heart healthy diet    Drug/Food Interactions Statins/Certain Fruits      Personal Nutrition Goals   Nutrition Goal Patient to identify strategies for reducing cardiovascular risk by attending the [employer] education and nutrition series weekly.    Personal Goal #2 Patient to improve diet quality by using the plate method as a guide for meal planning to include lean protein/plant protein, fruits, vegetables, whole grains, nonfat dairy as part of a well-balanced diet.    Personal Goal #3 Patient to reduce sodium to 1500mg  per day.    Comments Goals in action.  Marissa Carroll continues to attend the [employer]  series regularly. She continues to eat a wide variety of foods and has good understanding of reading food labels for sodium. Patient will benefit from participation in intensive cardiac rehab for nutrition, exercise, and lifestyle modification.      Intervention Plan   Intervention Prescribe, educate and counsel regarding individualized specific dietary modifications aiming towards targeted core components such as weight, hypertension, lipid management, diabetes, heart failure and other comorbidities.;Nutrition handout(s) given to patient.    Expected Outcomes Short Term Goal: Understand basic principles of dietary content, such as calories, fat, sodium, cholesterol and nutrients.;Long Term Goal: Adherence to prescribed nutrition plan.             Nutrition Assessments:  Nutrition Assessments - 03/02/23 1427       Rate Your Plate Scores   Pre Score 70            MEDIFICTS Score Key: ?70 Need to make dietary changes  40-70 Heart Healthy Diet ? 40 Therapeutic Level Cholesterol Diet   Flowsheet Row INTENSIVE CARDIAC REHAB from 03/02/2023 in [facility] Heart, Vascular, & Lung Health  Picture Your Plate Total Score on Admission 70      Picture Your Plate Scores: <65 Unhealthy dietary pattern with much room for improvement. 41-50 Dietary pattern unlikely to meet recommendations for good health and room for improvement. 51-60 More healthful dietary pattern, with some room for improvement.  >60 Healthy dietary pattern, although there may be some specific behaviors that could be improved.    Nutrition Goals Re-Evaluation:  Nutrition Goals Re-Evaluation     Row Name 03/02/23 1113 03/30/23 1021           Goals   Current Weight 132 lb 0.9 oz (59.9 kg) 131 lb 6.3 oz (59.6 kg)      Comment HDL 39, GFR 40, CR 1.26, BNP 331 no new labs; most recent labs HDL 39, GFR 40, CR 1.26, BNP 331      Expected Outcome Marissa Carroll reports making efforts to reduce saturated  fat intake and be mindful of sodium intake 2500mg  per day at this time. She is hopeful to getting back to playing tennis regularly. Patient will benefit from participation in intensive cardiac rehab for nutrition, exercise, and lifestyle modification. Goals in action. Marissa Carroll continues to attend the [employer] series regularly. She continues to eat a wide variety of foods and has good understanding of reading food labels for sodium. Patient will benefit from participation in intensive cardiac rehab for nutrition, exercise, and lifestyle modification.               Nutrition Goals Re-Evaluation:  Nutrition Goals Re-Evaluation     Row Name 03/02/23 1113 03/30/23 1021           Goals   Current Weight 132 lb 0.9 oz (59.9 kg) 131 lb 6.3 oz (59.6 kg)      Comment HDL 39, GFR 40, CR 1.26, BNP 331 no new labs; most recent labs HDL 39, GFR 40, CR 1.26, BNP 331      Expected Outcome Marissa Carroll reports making efforts to reduce saturated fat intake and be mindful of sodium intake 2500mg  per day at this time. She is hopeful to getting back to playing tennis regularly. Patient will benefit from participation in intensive cardiac rehab for nutrition, exercise, and lifestyle modification. Goals in action. Marissa Carroll continues to attend the [employer] series regularly. She continues to eat a wide variety  of foods and has good understanding of reading food labels for sodium. Patient will benefit from participation in intensive cardiac rehab for nutrition, exercise, and lifestyle modification.               Nutrition Goals Discharge (Final Nutrition Goals Re-Evaluation):  Nutrition Goals Re-Evaluation - 03/30/23 1021       Goals   Current Weight 131 lb 6.3 oz (59.6 kg)    Comment no new labs; most recent labs HDL 39, GFR 40, CR 1.26, BNP 331    Expected Outcome Goals in action. Marissa Carroll continues to attend the [employer] series regularly. She continues to eat a wide variety of foods and has  good understanding of reading food labels for sodium. Patient will benefit from participation in intensive cardiac rehab for nutrition, exercise, and lifestyle modification.             Psychosocial: Target Goals: Acknowledge presence or absence of significant depression and/or stress, maximize coping skills, provide positive support system. Participant is able to verbalize types and ability to use techniques and skills needed for reducing stress and depression.  Initial Review & Psychosocial Screening:  Initial Psych Review & Screening - 02/24/23 1431       Initial Review   Current issues with None Identified      Family Dynamics   Good Support System? Yes   Has daughter for support     Barriers   Psychosocial barriers to participate in program There are no identifiable barriers or psychosocial needs.      Screening Interventions   Interventions Encouraged to exercise             Quality of Life Scores:  Quality of Life - 02/24/23 1432       Quality of Life   Select Quality of Life      Quality of Life Scores   Health/Function Pre 23.38 %    Socioeconomic Pre 30 %    Psych/Spiritual Pre 29.64 %    Family Pre 30 %    GLOBAL Pre 29.19 %            Scores of 19 and below usually indicate a poorer quality of life in these areas.  A difference of  2-3 points is a clinically meaningful difference.  A difference of 2-3 points in the total score of the Quality of Life Index has been associated with significant improvement in overall quality of life, self-image, physical symptoms, and general health in studies assessing change in quality of life.  PHQ-9: Review Flowsheet       02/24/2023  Depression screen PHQ 2/9  Decreased Interest 0  Down, Depressed, Hopeless 0  PHQ - 2 Score 0  Altered sleeping 0  Tired, decreased energy 0  Change in appetite 0  Feeling bad or failure about yourself  0  Trouble concentrating 0  Moving slowly or fidgety/restless 0   Suicidal thoughts 0  PHQ-9 Score 0  Difficult doing work/chores Not difficult at all    Details           Interpretation of Total Score  Total Score Depression Severity:  1-4 = Minimal depression, 5-9 = Mild depression, 10-14 = Moderate depression, 15-19 = Moderately severe depression, 20-27 = Severe depression   Psychosocial Evaluation and Intervention:   Psychosocial Re-Evaluation:  Psychosocial Re-Evaluation     Row Name 03/02/23 1658 03/31/23 1342 03/31/23 1343         Psychosocial Re-Evaluation   Current issues  with None Identified None Identified None Identified     Comments -- -- Marissa Carroll is a widow she has the support of her adult children     Interventions Encouraged to attend Cardiac Rehabilitation for the exercise Encouraged to attend Cardiac Rehabilitation for the exercise Encouraged to attend Cardiac Rehabilitation for the exercise     Continue Psychosocial Services  No Follow up required No Follow up required No Follow up required              Psychosocial Discharge (Final Psychosocial Re-Evaluation):  Psychosocial Re-Evaluation - 03/31/23 1343       Psychosocial Re-Evaluation   Current issues with None Identified    Comments Marissa Carroll is a widow she has the support of her adult children    Interventions Encouraged to attend Cardiac Rehabilitation for the exercise    Continue Psychosocial Services  No Follow up required             Vocational Rehabilitation: Provide vocational rehab assistance to qualifying candidates.   Vocational Rehab Evaluation & Intervention:  Vocational Rehab - 02/24/23 1434       Initial Vocational Rehab Evaluation & Intervention   Assessment shows need for Vocational Rehabilitation No   Pt is retired            Education: Education Goals: Education classes will be provided on a weekly basis, covering required topics. Participant will state understanding/return demonstration of topics presented.    Education      Row Name 03/02/23 1300     Education   Cardiac Education Topics [employer]   Geographical information systems officer Exercise   Exercise Workshop Location manager and Fall Prevention   Instruction Review Code 1- Verbalizes Understanding   Class Start Time 1146   Class Stop Time 1226   Class Time Calculation (min) 40 min    Row Name 03/04/23 1300     Education   Cardiac Education Topics [employer]   Customer service manager   Weekly Topic Fast and Healthy Breakfasts   Instruction Review Code 1- Verbalizes Understanding   Class Start Time 1140   Class Stop Time 1212   Class Time Calculation (min) 32 min    Row Name 03/09/23 1600     Education   Cardiac Education Topics [employer]   [employer] Workshops     Workshops   Educator Exercise Physiologist   Select Psychosocial   Psychosocial Workshop Recognizing and Reducing Stress   Instruction Review Code 1- Verbalizes Understanding   Class Start Time 1152   Class Stop Time 1234   Class Time Calculation (min) 42 min    Row Name 03/11/23 1300     Education   Cardiac Education Topics [employer]   Orthoptist   Educator Dietitian   Weekly Topic Personalizing Your [employer] Plate   Instruction Review Code 1- Verbalizes Understanding   Class Start Time 1145   Class Stop Time 1220   Class Time Calculation (min) 35 min    Row Name 03/16/23 1500     Education   Cardiac Education Topics Pritikin   Glass blower/designer Nutrition   Nutrition Workshop Label Reading   Instruction Review Code 1- Verbalizes Understanding   Class Start Time 1145   Class Stop Time 1221   Class  Time Calculation (min) 36 min    Row Name 03/18/23 1500     Education   Cardiac Education Topics Pritikin   Orthoptist   Educator Dietitian   Weekly Topic [employer] Desserts    Instruction Review Code 1- Verbalizes Understanding   Class Start Time 1145   Class Stop Time 1235   Class Time Calculation (min) 50 min    Row Name 03/23/23 1300     Education   Cardiac Education Topics --   Select --     Workshops   Educator --   Physiological scientist --   Exercise Workshop --   Instruction Review Code --   Class Start Time --   Class Stop Time --   Class Time Calculation (min) --    Row Name 03/25/23 1300     Education   Cardiac Education Topics Pritikin   Customer service manager   Weekly Topic Tasty Appetizers and Snacks   Instruction Review Code 1- Verbalizes Understanding   Class Start Time 1143   Class Stop Time 1215   Class Time Calculation (min) 32 min    Row Name 03/30/23 1200     Education   Cardiac Education Topics Pritikin   Nurse, children's   Educator Dietitian   Select Nutrition   Nutrition Calorie Density   Instruction Review Code 1- Verbalizes Understanding   Class Start Time 1145   Class Stop Time 1223   Class Time Calculation (min) 38 min    Row Name 04/06/23 1000     Education   Cardiac Education Topics Pritikin   Geographical information systems officer Psychosocial   Psychosocial Workshop Focused Goals, Sustainable Changes   Instruction Review Code 1- Verbalizes Understanding   Class Start Time 1135   Class Stop Time 1210   Class Time Calculation (min) 35 min    Row Name 04/08/23 1200     Education   Cardiac Education Topics Pritikin   Orthoptist   Educator Dietitian   Weekly Topic One-Pot Wonders   Instruction Review Code 1- Verbalizes Understanding   Class Start Time 1140   Class Stop Time 1211   Class Time Calculation (min) 31 min    Row Name 04/13/23 1100     Education   Cardiac Education Topics Pritikin   Psychologist, forensic  Exercise Education   Exercise Education Biomechanial Limitations   Instruction Review Code 1- Verbalizes Understanding   Class Start Time 1150   Class Stop Time 1230   Class Time Calculation (min) 40 min    Row Name 04/15/23 1300     Education   Cardiac Education Topics Pritikin   Customer service manager   Weekly Topic Comforting Weekend Breakfasts   Instruction Review Code 1- Verbalizes Understanding   Class Start Time 1143   Class Stop Time 1216   Class Time Calculation (min) 33 min    Row Name 04/20/23 1600     Education   Cardiac Education Topics Pritikin   Nurse, children's   Educator Dietitian   Select Nutrition   Nutrition Facts on  Fat   Instruction Review Code 1- Verbalizes Understanding   Class Start Time 1150   Class Stop Time 1222   Class Time Calculation (min) 32 min    Row Name 04/22/23 1300     Education   Cardiac Education Topics Pritikin   Customer service manager   Weekly Topic Fast Evening Meals   Instruction Review Code 1- Verbalizes Understanding   Class Start Time 1138   Class Stop Time 1216   Class Time Calculation (min) 38 min            Core Videos: Exercise    Move It!  Clinical staff conducted group or individual video education with verbal and written material and guidebook.  Patient learns the recommended Pritikin exercise program. Exercise with the goal of living a long, healthy life. Some of the health benefits of exercise include controlled diabetes, healthier blood pressure levels, improved cholesterol levels, improved heart and lung capacity, improved sleep, and better body composition. Everyone should speak with their doctor before starting or changing an exercise routine.  Biomechanical Limitations Clinical staff conducted group or individual video education with verbal and written material and guidebook.  Patient learns how  biomechanical limitations can impact exercise and how we can mitigate and possibly overcome limitations to have an impactful and balanced exercise routine.  Body Composition Clinical staff conducted group or individual video education with verbal and written material and guidebook.  Patient learns that body composition (ratio of muscle mass to fat mass) is a key component to assessing overall fitness, rather than body weight alone. Increased fat mass, especially visceral belly fat, can put [place] at increased risk for metabolic syndrome, type 2 diabetes, heart disease, and even death. It is recommended to combine diet and exercise (cardiovascular and resistance training) to improve your body composition. Seek guidance from your physician and exercise physiologist before implementing an exercise routine.  Exercise Action Plan Clinical staff conducted group or individual video education with verbal and written material and guidebook.  Patient learns the recommended strategies to achieve and enjoy long-term exercise adherence, including variety, self-motivation, self-efficacy, and positive decision making. Benefits of exercise include fitness, good health, weight management, more energy, better sleep, less stress, and overall well-being.  Medical   Heart Disease Risk Reduction Clinical staff conducted group or individual video education with verbal and written material and guidebook.  Patient learns our heart is our most vital organ as it circulates oxygen, nutrients, white blood cells, and hormones throughout the entire body, and carries waste away. Data supports a plant-based eating plan like the Pritikin Program for its effectiveness in slowing progression of and reversing heart disease. The video provides a number of recommendations to address heart disease.   Metabolic Syndrome and Belly Fat  Clinical staff conducted group or individual video education with verbal and written material and guidebook.   Patient learns what metabolic syndrome is, how it leads to heart disease, and how one can reverse it and keep it from coming back. You have metabolic syndrome if you have 3 of the following 5 criteria: abdominal obesity, high blood pressure, high triglycerides, low HDL cholesterol, and high blood sugar.  Hypertension and Heart Disease Clinical staff conducted group or individual video education with verbal and written material and guidebook.  Patient learns that high blood pressure, or hypertension, is very common in the [place]. Hypertension is largely due to excessive salt intake, but other important risk factors  include being overweight, physical inactivity, drinking too much alcohol, smoking, and not eating enough potassium from fruits and vegetables. High blood pressure is a leading risk factor for heart attack, stroke, congestive heart failure, dementia, kidney failure, and premature death. Long-term effects of excessive salt intake include stiffening of the arteries and thickening of heart muscle and organ damage. Recommendations include ways to reduce hypertension and the risk of heart disease.  Diseases of Our Time - Focusing on Diabetes Clinical staff conducted group or individual video education with verbal and written material and guidebook.  Patient learns why the best way to stop diseases of our time is prevention, through food and other lifestyle changes. Medicine (such as prescription pills and surgeries) is often only a Band-Aid on the problem, not a long-term solution. Most common diseases of our time include obesity, type 2 diabetes, hypertension, heart disease, and cancer. The Pritikin Program is recommended and has been proven to help reduce, reverse, and/or prevent the damaging effects of metabolic syndrome.  Nutrition   Overview of the Pritikin Eating Plan  Clinical staff conducted group or individual video education with verbal and written material and guidebook.  Patient  learns about the Pritikin Eating Plan for disease risk reduction. The Pritikin Eating Plan emphasizes a wide variety of unrefined, minimally-processed carbohydrates, like fruits, vegetables, whole grains, and legumes. Go, Caution, and Stop food choices are explained. Plant-based and lean animal proteins are emphasized. Rationale provided for low sodium intake for blood pressure control, low added sugars for blood sugar stabilization, and low added fats and oils for coronary artery disease risk reduction and weight management.  Calorie Density  Clinical staff conducted group or individual video education with verbal and written material and guidebook.  Patient learns about calorie density and how it impacts the Pritikin Eating Plan. Knowing the characteristics of the food you choose will help you decide whether those foods will lead to weight gain or weight loss, and whether you want to consume more or less of them. Weight loss is usually a side effect of the Pritikin Eating Plan because of its focus on low calorie-dense foods.  Label Reading  Clinical staff conducted group or individual video education with verbal and written material and guidebook.  Patient learns about the Pritikin recommended label reading guidelines and corresponding recommendations regarding calorie density, added sugars, sodium content, and whole grains.  Dining Out - Part 1  Clinical staff conducted group or individual video education with verbal and written material and guidebook.  Patient learns that restaurant meals can be sabotaging because they can be so high in calories, fat, sodium, and/or sugar. Patient learns recommended strategies on how to positively address this and avoid unhealthy pitfalls.  Facts on Fats  Clinical staff conducted group or individual video education with verbal and written material and guidebook.  Patient learns that lifestyle modifications can be just as effective, if not more so, as many  medications for lowering your risk of heart disease. A Pritikin lifestyle can help to reduce your risk of inflammation and atherosclerosis (cholesterol build-up, or plaque, in the artery walls). Lifestyle interventions such as dietary choices and physical activity address the cause of atherosclerosis. A review of the types of fats and their impact on blood cholesterol levels, along with dietary recommendations to reduce fat intake is also included.  Nutrition Action Plan  Clinical staff conducted group or individual video education with verbal and written material and guidebook.  Patient learns how to incorporate Pritikin recommendations into their lifestyle.  Recommendations include planning and keeping personal health goals in mind as an important part of their success.  Healthy Mind-Set    Healthy Minds, Bodies, Hearts  Clinical staff conducted group or individual video education with verbal and written material and guidebook.  Patient learns how to identify when they are stressed. Video will discuss the impact of that stress, as well as the many benefits of stress management. Patient will also be introduced to stress management techniques. The way we think, act, and feel has an impact on our hearts.  How Our Thoughts Can Heal Our Hearts  Clinical staff conducted group or individual video education with verbal and written material and guidebook.  Patient learns that negative thoughts can cause depression and anxiety. This can result in negative lifestyle behavior and serious health problems. Cognitive behavioral therapy is an effective method to help control our thoughts in order to change and improve our emotional outlook.  Additional Videos:  Exercise    Improving Performance  Clinical staff conducted group or individual video education with verbal and written material and guidebook.  Patient learns to use a non-linear approach by alternating intensity levels and lengths of time spent  exercising to help burn more calories and lose more body fat. Cardiovascular exercise helps improve heart health, metabolism, hormonal balance, blood sugar control, and recovery from fatigue. Resistance training improves strength, endurance, balance, coordination, reaction time, metabolism, and muscle mass. Flexibility exercise improves circulation, posture, and balance. Seek guidance from your physician and exercise physiologist before implementing an exercise routine and learn your capabilities and proper form for all exercise.  Introduction to Yoga  Clinical staff conducted group or individual video education with verbal and written material and guidebook.  Patient learns about yoga, a discipline of the coming together of mind, breath, and body. The benefits of yoga include improved flexibility, improved range of motion, better posture and core strength, increased lung function, weight loss, and positive self-image. Yoga's heart health benefits include lowered blood pressure, healthier heart rate, decreased cholesterol and triglyceride levels, improved immune function, and reduced stress. Seek guidance from your physician and exercise physiologist before implementing an exercise routine and learn your capabilities and proper form for all exercise.  Medical   Aging: Enhancing Your Quality of Life  Clinical staff conducted group or individual video education with verbal and written material and guidebook.  Patient learns key strategies and recommendations to stay in good physical health and enhance quality of life, such as prevention strategies, having an advocate, securing a Health Care Proxy and Power of Attorney, and keeping a list of medications and system for tracking them. It also discusses how to avoid risk for bone loss.  Biology of Weight Control  Clinical staff conducted group or individual video education with verbal and written material and guidebook.  Patient learns that weight gain occurs  because we consume more calories than we burn (eating more, moving less). Even if your body weight is normal, you may have higher ratios of fat compared to muscle mass. Too much body fat puts you at increased risk for cardiovascular disease, heart attack, stroke, type 2 diabetes, and obesity-related cancers. In addition to exercise, following the Pritikin Eating Plan can help reduce your risk.  Decoding Lab Results  Clinical staff conducted group or individual video education with verbal and written material and guidebook.  Patient learns that lab test reflects one measurement whose values change over time and are influenced by many factors, including medication, stress, sleep, exercise, food, hydration,  pre-existing medical conditions, and more. It is recommended to use the knowledge from this video to become more involved with your lab results and evaluate your numbers to speak with your doctor.   Diseases of Our Time - Overview  Clinical staff conducted group or individual video education with verbal and written material and guidebook.  Patient learns that according to the CDC, 50% to 70% of chronic diseases (such as obesity, type 2 diabetes, elevated lipids, hypertension, and heart disease) are avoidable through lifestyle improvements including healthier food choices, listening to satiety cues, and increased physical activity.  Sleep Disorders Clinical staff conducted group or individual video education with verbal and written material and guidebook.  Patient learns how good quality and duration of sleep are important to overall health and well-being. Patient also learns about sleep disorders and how they impact health along with recommendations to address them, including discussing with a physician.  Nutrition  Dining Out - Part 2 Clinical staff conducted group or individual video education with verbal and written material and guidebook.  Patient learns how to plan ahead and communicate in  order to maximize their dining experience in a healthy and nutritious manner. Included are recommended food choices based on the type of restaurant the patient is visiting.   Fueling a Banker conducted group or individual video education with verbal and written material and guidebook.  There is a strong connection between our food choices and our health. Diseases like obesity and type 2 diabetes are very prevalent and are in large-part due to lifestyle choices. The Pritikin Eating Plan provides plenty of food and hunger-curbing satisfaction. It is easy to follow, affordable, and helps reduce health risks.  Menu Workshop  Clinical staff conducted group or individual video education with verbal and written material and guidebook.  Patient learns that restaurant meals can sabotage health goals because they are often packed with calories, fat, sodium, and sugar. Recommendations include strategies to plan ahead and to communicate with the manager, chef, or server to help order a healthier meal.  Planning Your Eating Strategy  Clinical staff conducted group or individual video education with verbal and written material and guidebook.  Patient learns about the Pritikin Eating Plan and its benefit of reducing the risk of disease. The Pritikin Eating Plan does not focus on calories. Instead, it emphasizes high-quality, nutrient-rich foods. By knowing the characteristics of the foods, we choose, we can determine their calorie density and make informed decisions.  Targeting Your Nutrition Priorities  Clinical staff conducted group or individual video education with verbal and written material and guidebook.  Patient learns that lifestyle habits have a tremendous impact on disease risk and progression. This video provides eating and physical activity recommendations based on your personal health goals, such as reducing LDL cholesterol, losing weight, preventing or controlling type 2  diabetes, and reducing high blood pressure.  Vitamins and Minerals  Clinical staff conducted group or individual video education with verbal and written material and guidebook.  Patient learns different ways to obtain key vitamins and minerals, including through a recommended healthy diet. It is important to discuss all supplements you take with your doctor.   Healthy Mind-Set    Smoking Cessation  Clinical staff conducted group or individual video education with verbal and written material and guidebook.  Patient learns that cigarette smoking and tobacco addiction pose a serious health risk which affects millions of people. Stopping smoking will significantly reduce the risk of heart disease, lung disease, and  many forms of cancer. Recommended strategies for quitting are covered, including working with your doctor to develop a successful plan.  Culinary   Becoming a Set designer conducted group or individual video education with verbal and written material and guidebook.  Patient learns that cooking at home can be healthy, cost-effective, quick, and puts them in control. Keys to cooking healthy recipes will include looking at your recipe, assessing your equipment needs, planning ahead, making it simple, choosing cost-effective seasonal ingredients, and limiting the use of added fats, salts, and sugars.  Cooking - Breakfast and Snacks  Clinical staff conducted group or individual video education with verbal and written material and guidebook.  Patient learns how important breakfast is to satiety and nutrition through the entire day. Recommendations include key foods to eat during breakfast to help stabilize blood sugar levels and to prevent overeating at meals later in the day. Planning ahead is also a key component.  Cooking - Educational psychologist conducted group or individual video education with verbal and written material and guidebook.  Patient learns eating  strategies to improve overall health, including an approach to cook more at home. Recommendations include thinking of animal protein as a side on your plate rather than center stage and focusing instead on lower calorie dense options like vegetables, fruits, whole grains, and plant-based proteins, such as beans. Making sauces in large quantities to freeze for later and leaving the skin on your vegetables are also recommended to maximize your experience.  Cooking - Healthy Salads and Dressing Clinical staff conducted group or individual video education with verbal and written material and guidebook.  Patient learns that vegetables, fruits, whole grains, and legumes are the foundations of the Pritikin Eating Plan. Recommendations include how to incorporate each of these in flavorful and healthy salads, and how to create homemade salad dressings. Proper handling of ingredients is also covered. Cooking - Soups and [employer] - Soups and Desserts Clinical staff conducted group or individual video education with verbal and written material and guidebook.  Patient learns that Pritikin soups and desserts make for easy, nutritious, and delicious snacks and meal components that are low in sodium, fat, sugar, and calorie density, while high in vitamins, minerals, and filling fiber. Recommendations include simple and healthy ideas for soups and desserts.   Overview     The Pritikin Solution Program Overview Clinical staff conducted group or individual video education with verbal and written material and guidebook.  Patient learns that the results of the Pritikin Program have been documented in more than 100 articles published in peer-reviewed journals, and the benefits include reducing risk factors for (and, in some cases, even reversing) high cholesterol, high blood pressure, type 2 diabetes, obesity, and more! An overview of the three key pillars of the Pritikin Program will be covered: eating well, doing  regular exercise, and having a healthy mind-set.  WORKSHOPS  Exercise: Exercise Basics: Building Your Action Plan Clinical staff led group instruction and group discussion with PowerPoint presentation and patient guidebook. To enhance the learning environment the use of posters, models and videos may be added. At the conclusion of this workshop, patients will comprehend the difference between physical activity and exercise, as well as the benefits of incorporating both, into their routine. Patients will understand the FITT (Frequency, Intensity, Time, and Type) principle and how to use it to build an exercise action plan. In addition, safety concerns and other considerations for exercise and cardiac rehab will  be addressed by the presenter. The purpose of this lesson is to promote a comprehensive and effective weekly exercise routine in order to improve patients' overall level of fitness.   Managing Heart Disease: Your Path to a Healthier Heart Clinical staff led group instruction and group discussion with PowerPoint presentation and patient guidebook. To enhance the learning environment the use of posters, models and videos may be added.At the conclusion of this workshop, patients will understand the anatomy and physiology of the heart. Additionally, they will understand how Pritikin's three pillars impact the risk factors, the progression, and the management of heart disease.  The purpose of this lesson is to provide a high-level overview of the heart, heart disease, and how the Pritikin lifestyle positively impacts risk factors.  Exercise Biomechanics Clinical staff led group instruction and group discussion with PowerPoint presentation and patient guidebook. To enhance the learning environment the use of posters, models and videos may be added. Patients will learn how the structural parts of their bodies function and how these functions impact their daily activities, movement, and exercise.  Patients will learn how to promote a neutral spine, learn how to manage pain, and identify ways to improve their physical movement in order to promote healthy living. The purpose of this lesson is to expose patients to common physical limitations that impact physical activity. Participants will learn practical ways to adapt and manage aches and pains, and to minimize their effect on regular exercise. Patients will learn how to maintain good posture while sitting, walking, and lifting.  Balance Training and Fall Prevention  Clinical staff led group instruction and group discussion with PowerPoint presentation and patient guidebook. To enhance the learning environment the use of posters, models and videos may be added. At the conclusion of this workshop, patients will understand the importance of their sensorimotor skills (vision, proprioception, and the vestibular system) in maintaining their ability to balance as they age. Patients will apply a variety of balancing exercises that are appropriate for their current level of function. Patients will understand the common causes for poor balance, possible solutions to these problems, and ways to modify their physical environment in order to minimize their fall risk. The purpose of this lesson is to teach patients about the importance of maintaining balance as they age and ways to minimize their risk of falling.  WORKSHOPS   Nutrition:  Fueling a Ship broker led group instruction and group discussion with PowerPoint presentation and patient guidebook. To enhance the learning environment the use of posters, models and videos may be added. Patients will review the foundational principles of the [facility] Eating Plan and understand what constitutes a serving size in each of the food groups. Patients will also learn Pritikin-friendly foods that are better choices when away from home and review make-ahead meal and snack options. Calorie  density will be reviewed and applied to three nutrition priorities: weight maintenance, weight loss, and weight gain. The purpose of this lesson is to reinforce (in a group setting) the key concepts around what patients are recommended to eat and how to apply these guidelines when away from home by planning and selecting Pritikin-friendly options. Patients will understand how calorie density may be adjusted for different weight management goals.  Mindful Eating  Clinical staff led group instruction and group discussion with PowerPoint presentation and patient guidebook. To enhance the learning environment the use of posters, models and videos may be added. Patients will briefly review the concepts of the [facility]ikin Eating Plan and  the importance of low-calorie dense foods. The concept of mindful eating will be introduced as well as the importance of paying attention to internal hunger signals. Triggers for non-hunger eating and techniques for dealing with triggers will be explored. The purpose of this lesson is to provide patients with the opportunity to review the basic principles of the P[facility]ikin Eating Plan, discuss the value of eating mindfully and how to measure internal cues of hunger and fullness using the Hunger Scale. Patients will also discuss reasons for non-hunger eating and learn strategies to use for controlling emotional eating.  Targeting Your Nutrition Priorities Clinical staff led group instruction and group discussion with PowerPoint presentation and patient guidebook. To enhance the learning environment the use of posters, models and videos may be added. Patients will learn how to determine their genetic susceptibility to disease by reviewing their family history. Patients will gain insight into the importance of diet as part of an overall healthy lifestyle in mitigating the impact of genetics and other environmental insults. The purpose of this lesson is to provide patients with the  opportunity to assess their personal nutrition priorities by looking at their family history, their own health history and current risk factors. Patients will also be able to discuss ways of prioritizing and modifying the Pritikin Eating Plan for their highest risk areas  Menu  Clinical staff led group instruction and group discussion with PowerPoint presentation and patient guidebook. To enhance the learning environment the use of posters, models and videos may be added. Using menus brought in from [employer], or printed from [employer], patients will apply the Pritikin dining out guidelines that were presented in the [employer] video. Patients will also be able to practice these guidelines in a variety of provided scenarios. The purpose of this lesson is to provide patients with the opportunity to practice hands-on learning of the Pritikin Dining Out guidelines with actual menus and practice scenarios.  Label Reading Clinical staff led group instruction and group discussion with PowerPoint presentation and patient guidebook. To enhance the learning environment the use of posters, models and videos may be added. Patients will review and discuss the Pritikin label reading guidelines presented in [employer]'s Label Reading Educational series video. Using fool labels brought in from local grocery stores and markets, patients will apply the label reading guidelines and determine if the packaged food meet the Pritikin guidelines. The purpose of this lesson is to provide patients with the opportunity to review, discuss, and practice hands-on learning of the Pritikin Label Reading guidelines with actual packaged food labels. Cooking School  [facility]'s [employer] are designed to teach patients ways to prepare quick, simple, and affordable recipes at home. The importance of nutrition's role in chronic disease risk reduction is reflected in its emphasis in the overall  [facility]in program. By learning how to prepare essential core Pritikin Eating Plan recipes, patients will increase control over what they eat; be able to customize the flavor of foods without the use of added salt, sugar, or fat; and improve the quality of the food they consume. By learning a set of core recipes which are easily assembled, quickly prepared, and affordable, patients are more likely to prepare more healthy foods at home. These workshops focus on convenient breakfasts, simple entres, side dishes, and desserts which can be prepared with minimal effort and are consistent with nutrition recommendations for cardiovascular risk reduction. Cooking [employer] are taught by a Armed forces logistics/support/administrative officer (RD) who has been trained  by the [employer] team. The chef or RD has a clear understanding of the importance of minimizing - if not completely eliminating - added fat, sugar, and sodium in recipes. Throughout the series of Cooking School Workshop sessions, patients will learn about healthy ingredients and efficient methods of cooking to build confidence in their capability to prepare    Cooking School weekly topics:  Adding Flavor- Sodium-Free  Fast and Healthy Breakfasts  Powerhouse Plant-Based Proteins  Satisfying Salads and Dressings  Simple Sides and Sauces  International Cuisine-Spotlight on the [employer] Zones  Delicious Desserts  Savory Soups  [employer] - Meals in a Astronomer Appetizers and Snacks  Comforting Weekend Breakfasts  One-Pot Wonders   Fast Evening Meals  Landscape architect Your Pritikin Plate  WORKSHOPS   Healthy Mindset (Psychosocial):  Focused Goals, Sustainable Changes Clinical staff led group instruction and group discussion with PowerPoint presentation and patient guidebook. To enhance the learning environment the use of posters, models and videos may be added. Patients will be able to apply effective goal setting strategies  to establish at least one personal goal, and then take consistent, meaningful action toward that goal. They will learn to identify common barriers to achieving personal goals and develop strategies to overcome them. Patients will also gain an understanding of how our mind-set can impact our ability to achieve goals and the importance of cultivating a positive and growth-oriented mind-set. The purpose of this lesson is to provide patients with a deeper understanding of how to set and achieve personal goals, as well as the tools and strategies needed to overcome common obstacles which may arise along the way.  From Head to Heart: The Power of a Healthy Outlook  Clinical staff led group instruction and group discussion with PowerPoint presentation and patient guidebook. To enhance the learning environment the use of posters, models and videos may be added. Patients will be able to recognize and describe the impact of emotions and mood on physical health. They will discover the importance of self-care and explore self-care practices which may work for them. Patients will also learn how to utilize the 4 C's to cultivate a healthier outlook and better manage stress and challenges. The purpose of this lesson is to demonstrate to patients how a healthy outlook is an essential part of maintaining good health, especially as they continue their cardiac rehab journey.  Healthy Sleep for a Healthy Heart Clinical staff led group instruction and group discussion with PowerPoint presentation and patient guidebook. To enhance the learning environment the use of posters, models and videos may be added. At the conclusion of this workshop, patients will be able to demonstrate knowledge of the importance of sleep to overall health, well-being, and quality of life. They will understand the symptoms of, and treatments for, common sleep disorders. Patients will also be able to identify daytime and nighttime behaviors which impact  sleep, and they will be able to apply these tools to help manage sleep-related challenges. The purpose of this lesson is to provide patients with a general overview of sleep and outline the importance of quality sleep. Patients will learn about a few of the most common sleep disorders. Patients will also be introduced to the concept of "sleep hygiene," and discover ways to self-manage certain sleeping problems through simple daily behavior changes. Finally, the workshop will motivate patients by clarifying the links between quality sleep and their goals of heart-healthy living.   Recognizing and Reducing Stress Clinical staff led  group instruction and group discussion with PowerPoint presentation and patient guidebook. To enhance the learning environment the use of posters, models and videos may be added. At the conclusion of this workshop, patients will be able to understand the types of stress reactions, differentiate between acute and chronic stress, and recognize the impact that chronic stress has on their health. They will also be able to apply different coping mechanisms, such as reframing negative self-talk. Patients will have the opportunity to practice a variety of stress management techniques, such as deep abdominal breathing, progressive muscle relaxation, and/or guided imagery.  The purpose of this lesson is to educate patients on the role of stress in their lives and to provide healthy techniques for coping with it.  Learning Barriers/Preferences:  Learning Barriers/Preferences - 02/24/23 1434       Learning Barriers/Preferences   Learning Barriers Hearing   bilateral hearing aids   Learning Preferences Written Material;Computer/Internet             Education Topics:  Knowledge Questionnaire Score:  Knowledge Questionnaire Score - 02/24/23 1433       Knowledge Questionnaire Score   Pre Score 16/24             Core Components/Risk Factors/Patient Goals at Admission:   Personal Goals and Risk Factors at Admission - 02/24/23 1433       Core Components/Risk Factors/Patient Goals on Admission    Weight Management Weight Maintenance;Yes    Expected Outcomes Long Term: Adherence to nutrition and physical activity/exercise program aimed toward attainment of established weight goal;Weight Maintenance: Understanding of the daily nutrition guidelines, which includes 25-35% calories from fat, 7% or less cal from saturated fats, less than 200mg  cholesterol, less than 1.5gm of sodium, & 5 or more servings of fruits and vegetables daily;Understanding recommendations for meals to include 15-35% energy as protein, 25-35% energy from fat, 35-60% energy from carbohydrates, less than 200mg  of dietary cholesterol, 20-35 gm of total fiber daily;Understanding of distribution of calorie intake throughout the day with the consumption of 4-5 meals/snacks    Heart Failure Yes    Intervention Provide a combined exercise and nutrition program that is supplemented with education, support and counseling about heart failure. Directed toward relieving symptoms such as shortness of breath, decreased exercise tolerance, and extremity edema.    Expected Outcomes Improve functional capacity of life;Short term: Attendance in program 2-3 days a week with increased exercise capacity. Reported lower sodium intake. Reported increased fruit and vegetable intake. Reports medication compliance.;Short term: Daily weights obtained and reported for increase. Utilizing diuretic protocols set by physician.;Long term: Adoption of self-care skills and reduction of barriers for early signs and symptoms recognition and intervention leading to self-care maintenance.    Hypertension Yes    Intervention Provide education on lifestyle modifcations including regular physical activity/exercise, weight management, moderate sodium restriction and increased consumption of fresh fruit, vegetables, and low fat dairy, alcohol  moderation, and smoking cessation.;Monitor prescription use compliance.    Expected Outcomes Short Term: Continued assessment and intervention until BP is < 140/22mm HG in hypertensive participants. < 130/7mm HG in hypertensive participants with diabetes, heart failure or chronic kidney disease.;Long Term: Maintenance of blood pressure at goal levels.    Lipids Yes    Intervention Provide education and support for participant on nutrition & aerobic/resistive exercise along with prescribed medications to achieve LDL 70mg , HDL >40mg .    Expected Outcomes Short Term: Participant states understanding of desired cholesterol values and is compliant with medications prescribed. Participant is  following exercise prescription and nutrition guidelines.;Long Term: Cholesterol controlled with medications as prescribed, with individualized exercise RX and with personalized nutrition plan. Value goals: LDL < 70mg , HDL > 40 mg.             Core Components/Risk Factors/Patient Goals Review:   Goals and Risk Factor Review     Row Name 03/02/23 1659 03/31/23 1343           Core Components/Risk Factors/Patient Goals Review   Personal Goals Review Weight Management/Obesity;Heart Failure;Hypertension;Lipids Weight Management/Obesity;Heart Failure;Hypertension;Lipids      Review Marissa Carroll started intensive cardiac rehab on 03/02/23 and did well with exercise, Vital signs were stable Marissa Carroll is doing  well with exercise, Vital signs have been stable. Marissa Carroll is enjoying participating in cardiac rehab. Marissa Carroll water walks in the pool on the days she does not attend cardiac rehab      Expected Outcomes Marissa Carroll will continue to participate in intensive cardiac rehab for exercise, nutrition and lifestyle modifications Marissa Carroll will continue to participate in intensive cardiac rehab for exercise, nutrition and lifestyle modifications               Core Components/Risk Factors/Patient Goals at Discharge (Final Review):   Goals and  Risk Factor Review - 03/31/23 1343       Core Components/Risk Factors/Patient Goals Review   Personal Goals Review Weight Management/Obesity;Heart Failure;Hypertension;Lipids    Review Marissa Carroll is doing  well with exercise, Vital signs have been stable. Marissa Carroll is enjoying participating in cardiac rehab. Marissa Carroll water walks in the pool on the days she does not attend cardiac rehab    Expected Outcomes Marissa Carroll will continue to participate in intensive cardiac rehab for exercise, nutrition and lifestyle modifications             ITP Comments:  ITP Comments     Row Name 02/24/23 1047 03/02/23 1150 03/31/23 1339       ITP Comments Introduction to [facility] Education Program/ Intensive Cardiac Rehab. Initial Orientation Packet reviewed with the patient. 30 Day ITP Review. Marissa Carroll  started intensive cardiac rehab on 03/02/23 and did well with exercise 30 Day ITP Review. Marissa Carroll has good attendance and participation in intensive cardiac rehab              Comments: ***

## 2023-04-28 NOTE — Progress Notes (Signed)
Cardiac Individual Treatment Plan  Patient Details  Name: Marissa Carroll MRN: [id number] Date of Birth: 1927/10/26 Referring Provider:   Flowsheet Row INTENSIVE CARDIAC REHAB ORIENT from 02/24/2023 in [facility]  Referring Provider [name], MD       Initial Encounter Date:  Flowsheet Row INTENSIVE CARDIAC REHAB ORIENT from 02/24/2023 in [facility]  Date 02/24/23       Visit Diagnosis: 11/14/22 NSTEMI (non-ST elevated myocardial infarction) (HCC)  11/14/22 Status post coronary artery stent placement  Patient's Home Medications on Admission:  Current Outpatient Medications:    apixaban (ELIQUIS) 5 MG TABS tablet, Take 1 tablet (5 mg total) by mouth 2 (two) times daily., Disp: 60 tablet, Rfl: 11   atorvastatin (LIPITOR) 40 MG tablet, Take 1 tablet (40 mg total) by mouth daily., Disp: 90 tablet, Rfl: 3   calcium-vitamin D (OSCAL WITH D) 500-5 MG-MCG tablet, Take 1 tablet by mouth., Disp: , Rfl:    carvedilol (COREG) 3.125 MG tablet, Take 1 tablet (3.125 mg total) by mouth 2 (two) times daily with a meal., Disp: 60 tablet, Rfl: 11   clopidogrel (PLAVIX) 75 MG tablet, Take 1 tablet (75 mg total) by mouth daily., Disp: 30 tablet, Rfl: 11   dapagliflozin propanediol (FARXIGA) 10 MG TABS tablet, Take 1 tablet (10 mg total) by mouth daily before breakfast., Disp: 30 tablet, Rfl: 11   diazepam (VALIUM) 5 MG tablet, As needed (Patient not taking: Reported on 03/17/2023), Disp: , Rfl:    furosemide (LASIX) 40 MG tablet, Take 1 tablet (40 mg total) by mouth daily., Disp: 90 tablet, Rfl: 3   Multiple Vitamins-Minerals (EYE VITAMINS PO), Take 1 tablet by mouth daily. , Disp: , Rfl:    nitroGLYCERIN (NITROSTAT) 0.4 MG SL tablet, Place 1 tablet (0.4 mg total) under the tongue every 5 (five) minutes as needed for chest pain. (Patient not taking: Reported on 03/17/2023), Disp: 90  tablet, Rfl: 3   spironolactone (ALDACTONE) 25 MG tablet, Take 1 tablet (25 mg total) by mouth daily., Disp: 90 tablet, Rfl: 3   valsartan (DIOVAN) 40 MG tablet, Take 0.5 tablets (20 mg total) by mouth 2 (two) times daily., Disp: 90 tablet, Rfl: 3  Past Medical History: Past Medical History:  Diagnosis Date   CHF (congestive heart failure) (HCC)    Coronary artery disease    History of breast cancer    Hyperlipidemia    Hypertension     Tobacco Use: Social History   Tobacco Use  Smoking Status Never  Smokeless Tobacco Never    Labs: Review Flowsheet       Latest Ref Rng & Units 11/27/2022 02/13/2023  Labs for ITP Cardiac and Pulmonary Rehab  Cholestrol 0 - 200 mg/dL 814  481   LDL (calc) 0 - 99 mg/dL 69  50   HDL-C >85 mg/dL 36  39   Trlycerides <631 mg/dL 94  87     Details            Capillary Blood Glucose: No results found for: "GLUCAP"   Exercise Target Goals: Exercise Program Goal: Individual exercise prescription set using results from initial 6 min walk test and THRR while considering  patient's activity barriers and safety.   Exercise Prescription Goal: Initial exercise prescription builds to 30-45 minutes a day of aerobic activity, 2-3 days per week.  Home exercise guidelines will be given to patient during  program as part of exercise prescription that the participant will acknowledge.  Activity Barriers & Risk Stratification:  Activity Barriers & Cardiac Risk Stratification - 02/24/23 1427       Activity Barriers & Cardiac Risk Stratification   Activity Barriers Joint Problems;Deconditioning;Muscular Weakness;Decreased Ventricular Function;Balance Concerns;Arthritis    Cardiac Risk Stratification High             6 Minute Walk:  6 Minute Walk     Row Name 02/24/23 1143         6 Minute Walk   Phase Initial     Distance 1274 feet     Walk Time 6 minutes     # of Rest Breaks 0     MPH 2.41     METS 1.7     RPE 11     Perceived  Dyspnea  0     VO2 Peak 5.8     Symptoms Yes (comment)     Comments Bilateral Hip pain 3/10     Resting HR 64 bpm     Resting BP 88/50     Resting Oxygen Saturation  99 %     Exercise Oxygen Saturation  during 6 min walk 100 %     Max Ex. HR 83 bpm     Max Ex. BP 126/70     2 Minute Post BP 110/68              Oxygen Initial Assessment:   Oxygen Re-Evaluation:   Oxygen Discharge (Final Oxygen Re-Evaluation):   Initial Exercise Prescription:  Initial Exercise Prescription - 02/24/23 1400       Date of Initial Exercise RX and Referring Provider   Date 02/24/23    Referring Provider [name], MD    Expected Discharge Date 05/08/23      Recumbant Bike   Level 1    RPM 60    Watts 25    Minutes 15    METs 1.7      NuStep   Level 2    SPM 75    Minutes 15    METs 1.7      Prescription Details   Frequency (times per week) 3    Duration Progress to 30 minutes of continuous aerobic without signs/symptoms of physical distress      Intensity   THRR 40-80% of Max Heartrate 51-102    Ratings of Perceived Exertion 11-13    Perceived Dyspnea 0-4      Progression   Progression Continue progressive overload as per policy without signs/symptoms or physical distress.      Resistance Training   Training Prescription Yes    Weight 2 lbs    Reps 10-15             Perform Capillary Blood Glucose checks as needed.  Exercise Prescription Changes:   Exercise Prescription Changes     Row Name 03/02/23 1018 03/16/23 1020 03/30/23 1022 04/13/23 1020 04/27/23 1024     Response to Exercise   Blood Pressure (Admit) 110/60 112/60 102/68 102/58 110/58   Blood Pressure (Exercise) 120/70 110/58 130/78 114/78 142/66   Blood Pressure (Exit) 110/64 104/70 104/60 102/64 102/62   Heart Rate (Admit) 64 bpm 62 bpm 66 bpm 62 bpm 62 bpm   Heart Rate (Exercise) 85 bpm 89 bpm 96 bpm 95 bpm 92 bpm   Heart Rate (Exit) 64 bpm 71 bpm 75 bpm 66 bpm 66 bpm   Rating of Perceived  Exertion (Exercise)  13 11 11 11 11    Symptoms None None None None None   Comments Off to a good start with exercise. -- Reviewed METs with patient. -- Reviewed METs   Duration Continue with 30 min of aerobic exercise without signs/symptoms of physical distress. Continue with 30 min of aerobic exercise without signs/symptoms of physical distress. Continue with 30 min of aerobic exercise without signs/symptoms of physical distress. Continue with 30 min of aerobic exercise without signs/symptoms of physical distress. Continue with 30 min of aerobic exercise without signs/symptoms of physical distress.   Intensity THRR unchanged THRR unchanged THRR unchanged THRR unchanged THRR unchanged     Progression   Progression Continue to progress workloads to maintain intensity without signs/symptoms of physical distress. Continue to progress workloads to maintain intensity without signs/symptoms of physical distress. Continue to progress workloads to maintain intensity without signs/symptoms of physical distress. Continue to progress workloads to maintain intensity without signs/symptoms of physical distress. Continue to progress workloads to maintain intensity without signs/symptoms of physical distress.   Average METs 2 2.3 2.8 2.8 3     Resistance Training   Training Prescription Yes Yes Yes Yes Yes   Weight 2 lbs 2 lbs 2 lbs 3 lbs 3 lbs   Reps 10-15 10-15 10-15 10-15 10-15   Time 10 Minutes 10 Minutes 10 Minutes 10 Minutes 10 Minutes     Interval Training   Interval Training No No No No No     Recumbant Bike   Level 1 1 2 2 2    RPM 65 58 63 68 65   Watts -- 14 25 19 22    Minutes 15 15 15 15 15    METs 2.2 2 2.5 2.4 2.6     NuStep   Level 2 2 3 3 3    SPM 68 84 85 90 91   Minutes 15 15 15 15 15    METs 1.9 2.7 3.1 3.2 3.5     Home Exercise Plan   Plans to continue exercise at -- -- Home (comment)  Walking in pool Home (comment)  Walking in pool Home (comment)  Walking in pool   Frequency -- --  Add 3 additional days to program exercise sessions. Add 3 additional days to program exercise sessions. Add 3 additional days to program exercise sessions.   Initial Home Exercises Provided -- -- 03/23/23 03/23/23 03/23/23            Exercise Comments:   Exercise Comments     Row Name 03/02/23 1130 03/18/23 1112 03/23/23 1057 03/30/23 1115 04/15/23 1103   Exercise Comments Patient tolerated low intensity exercise well without symptoms. Reviewed METs and goals with patient. Reviewed home exercise guidelines and goals with patient. Reviewed METs with patient. Reviewed METs with patient.    Row Name 04/22/23 1050 04/24/23 1111 04/27/23 1048       Exercise Comments Reviewed goals with patient. Reviewed METs with patient. Reviewed METs with patient.              Exercise Goals and Review:   Exercise Goals     Row Name 02/24/23 1429             Exercise Goals   Increase Physical Activity Yes       Intervention Provide advice, education, support and counseling about physical activity/exercise needs.;Develop an individualized exercise prescription for aerobic and resistive training based on initial evaluation findings, risk stratification, comorbidities and participant's personal goals.       Expected Outcomes Short  Term: Attend rehab on a regular basis to increase amount of physical activity.;Long Term: Add in home exercise to make exercise part of routine and to increase amount of physical activity.;Long Term: Exercising regularly at least 3-5 days a week.       Increase Strength and Stamina Yes       Intervention Provide advice, education, support and counseling about physical activity/exercise needs.;Develop an individualized exercise prescription for aerobic and resistive training based on initial evaluation findings, risk stratification, comorbidities and participant's personal goals.       Expected Outcomes Short Term: Increase workloads from initial exercise prescription  for resistance, speed, and METs.;Short Term: Perform resistance training exercises routinely during rehab and add in resistance training at home;Long Term: Improve cardiorespiratory fitness, muscular endurance and strength as measured by increased METs and functional capacity ( )       Able to understand and use rate of perceived exertion (RPE) scale Yes       Intervention Provide education and explanation on how to use RPE scale       Expected Outcomes Short Term: Able to use RPE daily in rehab to express subjective intensity level;Long Term:  Able to use RPE to guide intensity level when exercising independently       Knowledge and understanding of Target Heart Rate Range (THRR) Yes       Intervention Provide education and explanation of THRR including how the numbers were predicted and where they are located for reference       Expected Outcomes Short Term: Able to state/look up THRR;Short Term: Able to use daily as guideline for intensity in rehab;Long Term: Able to use THRR to govern intensity when exercising independently       Understanding of Exercise Prescription Yes       Intervention Provide education, explanation, and written materials on patient's individual exercise prescription       Expected Outcomes Short Term: Able to explain program exercise prescription;Long Term: Able to explain home exercise prescription to exercise independently                Exercise Goals Re-Evaluation :  Exercise Goals Re-Evaluation     Row Name 03/02/23 1645 03/18/23 1112 03/23/23 1057 04/22/23 1050       Exercise Goal Re-Evaluation   Exercise Goals Review Increase Physical Activity;Able to understand and use rate of perceived exertion (RPE) scale;Increase Strength and Stamina Increase Physical Activity;Able to understand and use rate of perceived exertion (RPE) scale;Increase Strength and Stamina Increase Physical Activity;Able to understand and use rate of perceived exertion (RPE)  scale;Increase Strength and Stamina;Knowledge and understanding of Target Heart Rate Range (THRR);Understanding of Exercise Prescription Increase Physical Activity;Able to understand and use rate of perceived exertion (RPE) scale;Increase Strength and Stamina;Knowledge and understanding of Target Heart Rate Range (THRR);Understanding of Exercise Prescription    Comments Patient able to understand and use RPE scale appropriately. Marissa Carroll is walking in the pool 30 minutes 3 days/week in addition to exercise at cardiac rehab. Reviewed exercise prescription with Marissa Carroll. She  is walking in her outdoor pool 30 minutes 3 days/week. her goal is to know what to do to stay healthy. Marissa Carroll is making steady progess with exercise. She is consistently walking in her pool in addition to exercise at cardiac rehab. She plans to resume walking on the street in addition to walking in the pool, weather permitting. She still gets tired and sits for 30 minutes and is ready to move again. She recently increased her  hand weights at cardiac rehab from 2 to 3 lbs.    Expected Outcomes Progress workloads as tolerated to help increase strength and stamina. Continue 30 minutes of aerobic exercise 6 days/week. Continue 30 minutes of aerobic exercise 6 days/week. Progress workloads as tolerated. Add street walking to pool walking at home.             Discharge Exercise Prescription (Final Exercise Prescription Changes):  Exercise Prescription Changes - 04/27/23 1024       Response to Exercise   Blood Pressure (Admit) 110/58    Blood Pressure (Exercise) 142/66    Blood Pressure (Exit) 102/62    Heart Rate (Admit) 62 bpm    Heart Rate (Exercise) 92 bpm    Heart Rate (Exit) 66 bpm    Rating of Perceived Exertion (Exercise) 11    Symptoms None    Comments Reviewed METs    Duration Continue with 30 min of aerobic exercise without signs/symptoms of physical distress.    Intensity THRR unchanged      Progression   Progression  Continue to progress workloads to maintain intensity without signs/symptoms of physical distress.    Average METs 3      Resistance Training   Training Prescription Yes    Weight 3 lbs    Reps 10-15    Time 10 Minutes      Interval Training   Interval Training No      Recumbant Bike   Level 2    RPM 65    Watts 22    Minutes 15    METs 2.6      NuStep   Level 3    SPM 91    Minutes 15    METs 3.5      Home Exercise Plan   Plans to continue exercise at Home (comment)   Walking in pool   Frequency Add 3 additional days to program exercise sessions.    Initial Home Exercises Provided 03/23/23             Nutrition:  Target Goals: Understanding of nutrition guidelines, daily intake of sodium 1500mg , cholesterol 200mg , calories 30% from fat and 7% or less from saturated fats, daily to have 5 or more servings of fruits and vegetables.  Biometrics:  Pre Biometrics - 02/24/23 1025       Pre Biometrics   Waist Circumference 35 inches    Hip Circumference 37.5 inches    Waist to Hip Ratio 0.93 %    Triceps Skinfold 11 mm    % Body Fat 33.4 %    Grip Strength 17 kg    Flexibility 16.75 in    Single Leg Stand 2.37 seconds              Nutrition Therapy Plan and Nutrition Goals:  Nutrition Therapy & Goals - 03/30/23 1021       Nutrition Therapy   Diet Heart healthy diet    Drug/Food Interactions Statins/Certain Fruits      Personal Nutrition Goals   Nutrition Goal Patient to identify strategies for reducing cardiovascular risk by attending the [employer] education and nutrition series weekly.    Personal Goal #2 Patient to improve diet quality by using the plate method as a guide for meal planning to include lean protein/plant protein, fruits, vegetables, whole grains, nonfat dairy as part of a well-balanced diet.    Personal Goal #3 Patient to reduce sodium to 1500mg  per day.    Comments Goals in action.  Marissa Carroll continues to attend the [employer]  series regularly. She continues to eat a wide variety of foods and has good understanding of reading food labels for sodium. Patient will benefit from participation in intensive cardiac rehab for nutrition, exercise, and lifestyle modification.      Intervention Plan   Intervention Prescribe, educate and counsel regarding individualized specific dietary modifications aiming towards targeted core components such as weight, hypertension, lipid management, diabetes, heart failure and other comorbidities.;Nutrition handout(s) given to patient.    Expected Outcomes Short Term Goal: Understand basic principles of dietary content, such as calories, fat, sodium, cholesterol and nutrients.;Long Term Goal: Adherence to prescribed nutrition plan.             Nutrition Assessments:  Nutrition Assessments - 03/02/23 1427       Rate Your Plate Scores   Pre Score 70            MEDIFICTS Score Key: ?70 Need to make dietary changes  40-70 Heart Healthy Diet ? 40 Therapeutic Level Cholesterol Diet   Flowsheet Row INTENSIVE CARDIAC REHAB from [facility]/02/2023 in [facility]  Picture Your Plate Total Score on Admission 70      Picture Your Plate Scores: <16 Unhealthy dietary pattern with much room for improvement. 41-50 Dietary pattern unlikely to meet recommendations for good health and room for improvement. 51-60 More healthful dietary pattern, with some room for improvement.  >60 Healthy dietary pattern, although there may be some specific behaviors that could be improved.    Nutrition Goals Re-Evaluation:  Nutrition Goals Re-Evaluation     Row Name 03/02/23 1113 03/30/23 1021           Goals   Current Weight 132 lb 0.9 oz (59.9 kg) 131 lb 6.3 oz (59.6 kg)      Comment HDL 39, GFR 40, CR 1.26, BNP 331 no new labs; most recent labs HDL 39, GFR 40, CR 1.26, BNP 331      Expected Outcome Marissa Carroll reports making efforts to reduce saturated  fat intake and be mindful of sodium intake 2500mg  per day at this time. She is hopeful to getting back to playing tennis regularly. Patient will benefit from participation in intensive cardiac rehab for nutrition, exercise, and lifestyle modification. Goals in action. Marissa Carroll continues to attend the [employer] series regularly. She continues to eat a wide variety of foods and has good understanding of reading food labels for sodium. Patient will benefit from participation in intensive cardiac rehab for nutrition, exercise, and lifestyle modification.               Nutrition Goals Re-Evaluation:  Nutrition Goals Re-Evaluation     Row Name 03/02/23 1113 03/30/23 1021           Goals   Current Weight 132 lb 0.9 oz (59.9 kg) 131 lb 6.3 oz (59.6 kg)      Comment HDL 39, GFR 40, CR 1.26, BNP 331 no new labs; most recent labs HDL 39, GFR 40, CR 1.26, BNP 331      Expected Outcome Marissa Carroll reports making efforts to reduce saturated fat intake and be mindful of sodium intake 2500mg  per day at this time. She is hopeful to getting back to playing tennis regularly. Patient will benefit from participation in intensive cardiac rehab for nutrition, exercise, and lifestyle modification. Goals in action. Marissa Carroll continues to attend the [employer] series regularly. She continues to eat a wide variety  of foods and has good understanding of reading food labels for sodium. Patient will benefit from participation in intensive cardiac rehab for nutrition, exercise, and lifestyle modification.               Nutrition Goals Discharge (Final Nutrition Goals Re-Evaluation):  Nutrition Goals Re-Evaluation - 03/30/23 1021       Goals   Current Weight 131 lb 6.3 oz (59.6 kg)    Comment no new labs; most recent labs HDL 39, GFR 40, CR 1.26, BNP 331    Expected Outcome Goals in action. Carlesha continues to attend the [employer] series regularly. She continues to eat a wide variety of foods and has  good understanding of reading food labels for sodium. Patient will benefit from participation in intensive cardiac rehab for nutrition, exercise, and lifestyle modification.             Psychosocial: Target Goals: Acknowledge presence or absence of significant depression and/or stress, maximize coping skills, provide positive support system. Participant is able to verbalize types and ability to use techniques and skills needed for reducing stress and depression.  Initial Review & Psychosocial Screening:  Initial Psych Review & Screening - 02/24/23 1431       Initial Review   Current issues with None Identified      Family Dynamics   Good Support System? Yes   Has daughter for support     Barriers   Psychosocial barriers to participate in program There are no identifiable barriers or psychosocial needs.      Screening Interventions   Interventions Encouraged to exercise             Quality of Life Scores:  Quality of Life - 02/24/23 1432       Quality of Life   Select Quality of Life      Quality of Life Scores   Health/Function Pre 23.38 %    Socioeconomic Pre 30 %    Psych/Spiritual Pre 29.64 %    Family Pre 30 %    GLOBAL Pre 29.19 %            Scores of 19 and below usually indicate a poorer quality of life in these areas.  A difference of  2-3 points is a clinically meaningful difference.  A difference of 2-3 points in the total score of the Quality of Life Index has been associated with significant improvement in overall quality of life, self-image, physical symptoms, and general health in studies assessing change in quality of life.  PHQ-9: Review Flowsheet       02/24/2023  Depression screen PHQ 2/9  Decreased Interest 0  Down, Depressed, Hopeless 0  PHQ - 2 Score 0  Altered sleeping 0  Tired, decreased energy 0  Change in appetite 0  Feeling bad or failure about yourself  0  Trouble concentrating 0  Moving slowly or fidgety/restless 0   Suicidal thoughts 0  PHQ-9 Score 0  Difficult doing work/chores Not difficult at all    Details           Interpretation of Total Score  Total Score Depression Severity:  1-4 = Minimal depression, 5-9 = Mild depression, 10-14 = Moderate depression, 15-19 = Moderately severe depression, 20-27 = Severe depression   Psychosocial Evaluation and Intervention:   Psychosocial Re-Evaluation:  Psychosocial Re-Evaluation     Row Name 03/02/23 1658 03/31/23 1342 03/31/23 1343 04/28/23 1135       Psychosocial Re-Evaluation   Current issues  with None Identified None Identified None Identified None Identified    Comments -- -- Marissa Carroll is a widow she has the support of her adult children --    Interventions Encouraged to attend Cardiac Rehabilitation for the exercise Encouraged to attend Cardiac Rehabilitation for the exercise Encouraged to attend Cardiac Rehabilitation for the exercise Encouraged to attend Cardiac Rehabilitation for the exercise    Continue Psychosocial Services  No Follow up required No Follow up required No Follow up required No Follow up required             Psychosocial Discharge (Final Psychosocial Re-Evaluation):  Psychosocial Re-Evaluation - 04/28/23 1135       Psychosocial Re-Evaluation   Current issues with None Identified    Comments --    Interventions Encouraged to attend Cardiac Rehabilitation for the exercise    Continue Psychosocial Services  No Follow up required             Vocational Rehabilitation: Provide vocational rehab assistance to qualifying candidates.   Vocational Rehab Evaluation & Intervention:  Vocational Rehab - 02/24/23 1434       Initial Vocational Rehab Evaluation & Intervention   Assessment shows need for Vocational Rehabilitation No   Pt is retired            Education: Education Goals: Education classes will be provided on a weekly basis, covering required topics. Participant will state understanding/return  demonstration of topics presented.    Education     Row Name 03/02/23 1300     Education   Cardiac Education Topics [employer]   Geographical information systems officer Exercise   Exercise Workshop Location manager and Fall Prevention   Instruction Review Code 1- Verbalizes Understanding   Class Start Time 1146   Class Stop Time 1226   Class Time Calculation (min) 40 min    Row Name 03/04/23 1300     Education   Cardiac Education Topics [employer]   Customer service manager   Weekly Topic Fast and Healthy Breakfasts   Instruction Review Code 1- Verbalizes Understanding   Class Start Time 1140   Class Stop Time 1212   Class Time Calculation (min) 32 min    Row Name 03/09/23 1600     Education   Cardiac Education Topics [employer]   [employer] Workshops     Workshops   Educator Exercise Physiologist   [facility] Psychosocial   Psychosocial Workshop Recognizing and Reducing Stress   Instruction Review Code 1- Verbalizes Understanding   Class Start Time 1152   Class Stop Time 1234   Class Time Calculation (min) 42 min    Row Name 03/11/23 1300     Education   Cardiac Education Topics [employer]   Orthoptist   Educator Dietitian   Weekly Topic Personalizing Your Pritikin Plate   Instruction Review Code 1- Verbalizes Understanding   Class Start Time 1145   Class Stop Time 1220   Class Time Calculation (min) 35 min    Row Name 03/16/23 1500     Education   Cardiac Education Topics Pritikin   Glass blower/designer Nutrition   Nutrition Workshop Label Reading   Instruction Review Code 1- Verbalizes Understanding   Class Start Time 1145   Class Stop Time 1221   Class  Time Calculation (min) 36 min    Row Name 03/18/23 1500     Education   Cardiac Education Topics Pritikin   Orthoptist    Educator Dietitian   Weekly Topic [employer] Desserts   Instruction Review Code 1- Verbalizes Understanding   Class Start Time 1145   Class Stop Time 1235   Class Time Calculation (min) 50 min    Row Name 03/23/23 1300     Education   Cardiac Education Topics --   Select --     Workshops   Educator --   Physiological scientist --   Exercise Workshop --   Instruction Review Code --   Class Start Time --   Class Stop Time --   Class Time Calculation (min) --    Row Name 03/25/23 1300     Education   Cardiac Education Topics Pritikin   Customer service manager   Weekly Topic Tasty Appetizers and Snacks   Instruction Review Code 1- Verbalizes Understanding   Class Start Time 1143   Class Stop Time 1215   Class Time Calculation (min) 32 min    Row Name 03/30/23 1200     Education   Cardiac Education Topics Pritikin   Nurse, children's   Educator Dietitian   Select Nutrition   Nutrition Calorie Density   Instruction Review Code 1- Verbalizes Understanding   Class Start Time 1145   Class Stop Time 1223   Class Time Calculation (min) 38 min    Row Name 04/06/23 1000     Education   Cardiac Education Topics Pritikin   Geographical information systems officer Psychosocial   Psychosocial Workshop Focused Goals, Sustainable Changes   Instruction Review Code 1- Verbalizes Understanding   Class Start Time 1135   Class Stop Time 1210   Class Time Calculation (min) 35 min    Row Name 04/08/23 1200     Education   Cardiac Education Topics Pritikin   Orthoptist   Educator Dietitian   Weekly Topic One-Pot Wonders   Instruction Review Code 1- Verbalizes Understanding   Class Start Time 1140   Class Stop Time 1211   Class Time Calculation (min) 31 min    Row Name 04/13/23 1100     Education   Cardiac Education Topics Pritikin   Regulatory affairs officer Exercise Education   Exercise Education Biomechanial Limitations   Instruction Review Code 1- Verbalizes Understanding   Class Start Time 1150   Class Stop Time 1230   Class Time Calculation (min) 40 min    Row Name 04/15/23 1300     Education   Cardiac Education Topics Pritikin   Customer service manager   Weekly Topic Comforting Weekend Breakfasts   Instruction Review Code 1- Verbalizes Understanding   Class Start Time 1143   Class Stop Time 1216   Class Time Calculation (min) 33 min    Row Name 04/20/23 1600     Education   Cardiac Education Topics Pritikin   Nurse, children's   Educator Dietitian   Select Nutrition   Nutrition Facts on  Fat   Instruction Review Code 1- Verbalizes Understanding   Class Start Time 1150   Class Stop Time 1222   Class Time Calculation (min) 32 min    Row Name 04/22/23 1300     Education   Cardiac Education Topics Pritikin   Customer service manager   Weekly Topic Fast Evening Meals   Instruction Review Code 1- Verbalizes Understanding   Class Start Time 1138   Class Stop Time 1216   Class Time Calculation (min) 38 min            Core Videos: Exercise    Move It!  Clinical staff conducted group or individual video education with verbal and written material and guidebook.  Patient learns the recommended Pritikin exercise program. Exercise with the goal of living a long, healthy life. Some of the health benefits of exercise include controlled diabetes, healthier blood pressure levels, improved cholesterol levels, improved heart and lung capacity, improved sleep, and better body composition. Everyone should speak with their doctor before starting or changing an exercise routine.  Biomechanical Limitations Clinical staff conducted group or individual video education with verbal and  written material and guidebook.  Patient learns how biomechanical limitations can impact exercise and how we can mitigate and possibly overcome limitations to have an impactful and balanced exercise routine.  Body Composition Clinical staff conducted group or individual video education with verbal and written material and guidebook.  Patient learns that body composition (ratio of muscle mass to fat mass) is a key component to assessing overall fitness, rather than body weight alone. Increased fat mass, especially visceral belly fat, can put [place] at increased risk for metabolic syndrome, type 2 diabetes, heart disease, and even death. It is recommended to combine diet and exercise (cardiovascular and resistance training) to improve your body composition. Seek guidance from your physician and exercise physiologist before implementing an exercise routine.  Exercise Action Plan Clinical staff conducted group or individual video education with verbal and written material and guidebook.  Patient learns the recommended strategies to achieve and enjoy long-term exercise adherence, including variety, self-motivation, self-efficacy, and positive decision making. Benefits of exercise include fitness, good health, weight management, more energy, better sleep, less stress, and overall well-being.  Medical   Heart Disease Risk Reduction Clinical staff conducted group or individual video education with verbal and written material and guidebook.  Patient learns our heart is our most vital organ as it circulates oxygen, nutrients, white blood cells, and hormones throughout the entire body, and carries waste away. Data supports a plant-based eating plan like the Pritikin Program for its effectiveness in slowing progression of and reversing heart disease. The video provides a number of recommendations to address heart disease.   Metabolic Syndrome and Belly Fat  Clinical staff conducted group or individual video  education with verbal and written material and guidebook.  Patient learns what metabolic syndrome is, how it leads to heart disease, and how one can reverse it and keep it from coming back. You have metabolic syndrome if you have 3 of the following 5 criteria: abdominal obesity, high blood pressure, high triglycerides, low HDL cholesterol, and high blood sugar.  Hypertension and Heart Disease Clinical staff conducted group or individual video education with verbal and written material and guidebook.  Patient learns that high blood pressure, or hypertension, is very common in the [place]. Hypertension is largely due to excessive salt intake, but other important risk factors  include being overweight, physical inactivity, drinking too much alcohol, smoking, and not eating enough potassium from fruits and vegetables. High blood pressure is a leading risk factor for heart attack, stroke, congestive heart failure, dementia, kidney failure, and premature death. Long-term effects of excessive salt intake include stiffening of the arteries and thickening of heart muscle and organ damage. Recommendations include ways to reduce hypertension and the risk of heart disease.  Diseases of Our Time - Focusing on Diabetes Clinical staff conducted group or individual video education with verbal and written material and guidebook.  Patient learns why the best way to stop diseases of our time is prevention, through food and other lifestyle changes. Medicine (such as prescription pills and surgeries) is often only a Band-Aid on the problem, not a long-term solution. Most common diseases of our time include obesity, type 2 diabetes, hypertension, heart disease, and cancer. The Pritikin Program is recommended and has been proven to help reduce, reverse, and/or prevent the damaging effects of metabolic syndrome.  Nutrition   Overview of the Pritikin Eating Plan  Clinical staff conducted group or individual video education  with verbal and written material and guidebook.  Patient learns about the Pritikin Eating Plan for disease risk reduction. The Pritikin Eating Plan emphasizes a wide variety of unrefined, minimally-processed carbohydrates, like fruits, vegetables, whole grains, and legumes. Go, Caution, and Stop food choices are explained. Plant-based and lean animal proteins are emphasized. Rationale provided for low sodium intake for blood pressure control, low added sugars for blood sugar stabilization, and low added fats and oils for coronary artery disease risk reduction and weight management.  Calorie Density  Clinical staff conducted group or individual video education with verbal and written material and guidebook.  Patient learns about calorie density and how it impacts the Pritikin Eating Plan. Knowing the characteristics of the food you choose will help you decide whether those foods will lead to weight gain or weight loss, and whether you want to consume more or less of them. Weight loss is usually a side effect of the Pritikin Eating Plan because of its focus on low calorie-dense foods.  Label Reading  Clinical staff conducted group or individual video education with verbal and written material and guidebook.  Patient learns about the Pritikin recommended label reading guidelines and corresponding recommendations regarding calorie density, added sugars, sodium content, and whole grains.  Dining Out - Part 1  Clinical staff conducted group or individual video education with verbal and written material and guidebook.  Patient learns that restaurant meals can be sabotaging because they can be so high in calories, fat, sodium, and/or sugar. Patient learns recommended strategies on how to positively address this and avoid unhealthy pitfalls.  Facts on Fats  Clinical staff conducted group or individual video education with verbal and written material and guidebook.  Patient learns that lifestyle modifications  can be just as effective, if not more so, as many medications for lowering your risk of heart disease. A Pritikin lifestyle can help to reduce your risk of inflammation and atherosclerosis (cholesterol build-up, or plaque, in the artery walls). Lifestyle interventions such as dietary choices and physical activity address the cause of atherosclerosis. A review of the types of fats and their impact on blood cholesterol levels, along with dietary recommendations to reduce fat intake is also included.  Nutrition Action Plan  Clinical staff conducted group or individual video education with verbal and written material and guidebook.  Patient learns how to incorporate Pritikin recommendations into their lifestyle.  Recommendations include planning and keeping personal health goals in mind as an important part of their success.  Healthy Mind-Set    Healthy Minds, Bodies, Hearts  Clinical staff conducted group or individual video education with verbal and written material and guidebook.  Patient learns how to identify when they are stressed. Video will discuss the impact of that stress, as well as the many benefits of stress management. Patient will also be introduced to stress management techniques. The way we think, act, and feel has an impact on our hearts.  How Our Thoughts Can Heal Our Hearts  Clinical staff conducted group or individual video education with verbal and written material and guidebook.  Patient learns that negative thoughts can cause depression and anxiety. This can result in negative lifestyle behavior and serious health problems. Cognitive behavioral therapy is an effective method to help control our thoughts in order to change and improve our emotional outlook.  Additional Videos:  Exercise    Improving Performance  Clinical staff conducted group or individual video education with verbal and written material and guidebook.  Patient learns to use a non-linear approach by alternating  intensity levels and lengths of time spent exercising to help burn more calories and lose more body fat. Cardiovascular exercise helps improve heart health, metabolism, hormonal balance, blood sugar control, and recovery from fatigue. Resistance training improves strength, endurance, balance, coordination, reaction time, metabolism, and muscle mass. Flexibility exercise improves circulation, posture, and balance. Seek guidance from your physician and exercise physiologist before implementing an exercise routine and learn your capabilities and proper form for all exercise.  Introduction to Yoga  Clinical staff conducted group or individual video education with verbal and written material and guidebook.  Patient learns about yoga, a discipline of the coming together of mind, breath, and body. The benefits of yoga include improved flexibility, improved range of motion, better posture and core strength, increased lung function, weight loss, and positive self-image. Yoga's heart health benefits include lowered blood pressure, healthier heart rate, decreased cholesterol and triglyceride levels, improved immune function, and reduced stress. Seek guidance from your physician and exercise physiologist before implementing an exercise routine and learn your capabilities and proper form for all exercise.  Medical   Aging: Enhancing Your Quality of Life  Clinical staff conducted group or individual video education with verbal and written material and guidebook.  Patient learns key strategies and recommendations to stay in good physical health and enhance quality of life, such as prevention strategies, having an advocate, securing a Health Care Proxy and Power of Attorney, and keeping a list of medications and system for tracking them. It also discusses how to avoid risk for bone loss.  Biology of Weight Control  Clinical staff conducted group or individual video education with verbal and written material and  guidebook.  Patient learns that weight gain occurs because we consume more calories than we burn (eating more, moving less). Even if your body weight is normal, you may have higher ratios of fat compared to muscle mass. Too much body fat puts you at increased risk for cardiovascular disease, heart attack, stroke, type 2 diabetes, and obesity-related cancers. In addition to exercise, following the Pritikin Eating Plan can help reduce your risk.  Decoding Lab Results  Clinical staff conducted group or individual video education with verbal and written material and guidebook.  Patient learns that lab test reflects one measurement whose values change over time and are influenced by many factors, including medication, stress, sleep, exercise, food, hydration,  pre-existing medical conditions, and more. It is recommended to use the knowledge from this video to become more involved with your lab results and evaluate your numbers to speak with your doctor.   Diseases of Our Time - Overview  Clinical staff conducted group or individual video education with verbal and written material and guidebook.  Patient learns that according to the CDC, 50% to 70% of chronic diseases (such as obesity, type 2 diabetes, elevated lipids, hypertension, and heart disease) are avoidable through lifestyle improvements including healthier food choices, listening to satiety cues, and increased physical activity.  Sleep Disorders Clinical staff conducted group or individual video education with verbal and written material and guidebook.  Patient learns how good quality and duration of sleep are important to overall health and well-being. Patient also learns about sleep disorders and how they impact health along with recommendations to address them, including discussing with a physician.  Nutrition  Dining Out - Part 2 Clinical staff conducted group or individual video education with verbal and written material and guidebook.   Patient learns how to plan ahead and communicate in order to maximize their dining experience in a healthy and nutritious manner. Included are recommended food choices based on the type of restaurant the patient is visiting.   Fueling a Banker conducted group or individual video education with verbal and written material and guidebook.  There is a strong connection between our food choices and our health. Diseases like obesity and type 2 diabetes are very prevalent and are in large-part due to lifestyle choices. The Pritikin Eating Plan provides plenty of food and hunger-curbing satisfaction. It is easy to follow, affordable, and helps reduce health risks.  Menu Workshop  Clinical staff conducted group or individual video education with verbal and written material and guidebook.  Patient learns that restaurant meals can sabotage health goals because they are often packed with calories, fat, sodium, and sugar. Recommendations include strategies to plan ahead and to communicate with the manager, chef, or server to help order a healthier meal.  Planning Your Eating Strategy  Clinical staff conducted group or individual video education with verbal and written material and guidebook.  Patient learns about the Pritikin Eating Plan and its benefit of reducing the risk of disease. The Pritikin Eating Plan does not focus on calories. Instead, it emphasizes high-quality, nutrient-rich foods. By knowing the characteristics of the foods, we choose, we can determine their calorie density and make informed decisions.  Targeting Your Nutrition Priorities  Clinical staff conducted group or individual video education with verbal and written material and guidebook.  Patient learns that lifestyle habits have a tremendous impact on disease risk and progression. This video provides eating and physical activity recommendations based on your personal health goals, such as reducing LDL cholesterol,  losing weight, preventing or controlling type 2 diabetes, and reducing high blood pressure.  Vitamins and Minerals  Clinical staff conducted group or individual video education with verbal and written material and guidebook.  Patient learns different ways to obtain key vitamins and minerals, including through a recommended healthy diet. It is important to discuss all supplements you take with your doctor.   Healthy Mind-Set    Smoking Cessation  Clinical staff conducted group or individual video education with verbal and written material and guidebook.  Patient learns that cigarette smoking and tobacco addiction pose a serious health risk which affects millions of people. Stopping smoking will significantly reduce the risk of heart disease, lung disease, and  many forms of cancer. Recommended strategies for quitting are covered, including working with your doctor to develop a successful plan.  Culinary   Becoming a Set designer conducted group or individual video education with verbal and written material and guidebook.  Patient learns that cooking at home can be healthy, cost-effective, quick, and puts them in control. Keys to cooking healthy recipes will include looking at your recipe, assessing your equipment needs, planning ahead, making it simple, choosing cost-effective seasonal ingredients, and limiting the use of added fats, salts, and sugars.  Cooking - Breakfast and Snacks  Clinical staff conducted group or individual video education with verbal and written material and guidebook.  Patient learns how important breakfast is to satiety and nutrition through the entire day. Recommendations include key foods to eat during breakfast to help stabilize blood sugar levels and to prevent overeating at meals later in the day. Planning ahead is also a key component.  Cooking - Educational psychologist conducted group or individual video education with verbal and written  material and guidebook.  Patient learns eating strategies to improve overall health, including an approach to cook more at home. Recommendations include thinking of animal protein as a side on your plate rather than center stage and focusing instead on lower calorie dense options like vegetables, fruits, whole grains, and plant-based proteins, such as beans. Making sauces in large quantities to freeze for later and leaving the skin on your vegetables are also recommended to maximize your experience.  Cooking - Healthy Salads and Dressing Clinical staff conducted group or individual video education with verbal and written material and guidebook.  Patient learns that vegetables, fruits, whole grains, and legumes are the foundations of the Pritikin Eating Plan. Recommendations include how to incorporate each of these in flavorful and healthy salads, and how to create homemade salad dressings. Proper handling of ingredients is also covered. Cooking - Soups and [employer] - Soups and Desserts Clinical staff conducted group or individual video education with verbal and written material and guidebook.  Patient learns that Pritikin soups and desserts make for easy, nutritious, and delicious snacks and meal components that are low in sodium, fat, sugar, and calorie density, while high in vitamins, minerals, and filling fiber. Recommendations include simple and healthy ideas for soups and desserts.   Overview     The Pritikin Solution Program Overview Clinical staff conducted group or individual video education with verbal and written material and guidebook.  Patient learns that the results of the Pritikin Program have been documented in more than 100 articles published in peer-reviewed journals, and the benefits include reducing risk factors for (and, in some cases, even reversing) high cholesterol, high blood pressure, type 2 diabetes, obesity, and more! An overview of the three key pillars of the  Pritikin Program will be covered: eating well, doing regular exercise, and having a healthy mind-set.  WORKSHOPS  Exercise: Exercise Basics: Building Your Action Plan Clinical staff led group instruction and group discussion with PowerPoint presentation and patient guidebook. To enhance the learning environment the use of posters, models and videos may be added. At the conclusion of this workshop, patients will comprehend the difference between physical activity and exercise, as well as the benefits of incorporating both, into their routine. Patients will understand the FITT (Frequency, Intensity, Time, and Type) principle and how to use it to build an exercise action plan. In addition, safety concerns and other considerations for exercise and cardiac rehab will  be addressed by the presenter. The purpose of this lesson is to promote a comprehensive and effective weekly exercise routine in order to improve patients' overall level of fitness.   Managing Heart Disease: Your Path to a Healthier Heart Clinical staff led group instruction and group discussion with PowerPoint presentation and patient guidebook. To enhance the learning environment the use of posters, models and videos may be added.At the conclusion of this workshop, patients will understand the anatomy and physiology of the heart. Additionally, they will understand how Pritikin's three pillars impact the risk factors, the progression, and the management of heart disease.  The purpose of this lesson is to provide a high-level overview of the heart, heart disease, and how the Pritikin lifestyle positively impacts risk factors.  Exercise Biomechanics Clinical staff led group instruction and group discussion with PowerPoint presentation and patient guidebook. To enhance the learning environment the use of posters, models and videos may be added. Patients will learn how the structural parts of their bodies function and how these functions  impact their daily activities, movement, and exercise. Patients will learn how to promote a neutral spine, learn how to manage pain, and identify ways to improve their physical movement in order to promote healthy living. The purpose of this lesson is to expose patients to common physical limitations that impact physical activity. Participants will learn practical ways to adapt and manage aches and pains, and to minimize their effect on regular exercise. Patients will learn how to maintain good posture while sitting, walking, and lifting.  Balance Training and Fall Prevention  Clinical staff led group instruction and group discussion with PowerPoint presentation and patient guidebook. To enhance the learning environment the use of posters, models and videos may be added. At the conclusion of this workshop, patients will understand the importance of their sensorimotor skills (vision, proprioception, and the vestibular system) in maintaining their ability to balance as they age. Patients will apply a variety of balancing exercises that are appropriate for their current level of function. Patients will understand the common causes for poor balance, possible solutions to these problems, and ways to modify their physical environment in order to minimize their fall risk. The purpose of this lesson is to teach patients about the importance of maintaining balance as they age and ways to minimize their risk of falling.  WORKSHOPS   Nutrition:  Fueling a Ship broker led group instruction and group discussion with PowerPoint presentation and patient guidebook. To enhance the learning environment the use of posters, models and videos may be added. Patients will review the foundational principles of the [facility]in Eating Plan and understand what constitutes a serving size in each of the food groups. Patients will also learn Pritikin-friendly foods that are better choices when away from home and  review make-ahead meal and snack options. Calorie density will be reviewed and applied to three nutrition priorities: weight maintenance, weight loss, and weight gain. The purpose of this lesson is to reinforce (in a group setting) the key concepts around what patients are recommended to eat and how to apply these guidelines when away from home by planning and selecting Pritikin-friendly options. Patients will understand how calorie density may be adjusted for different weight management goals.  Mindful Eating  Clinical staff led group instruction and group discussion with PowerPoint presentation and patient guidebook. To enhance the learning environment the use of posters, models and videos may be added. Patients will briefly review the concepts of the Pritikin Eating Plan and  the importance of low-calorie dense foods. The concept of mindful eating will be introduced as well as the importance of paying attention to internal hunger signals. Triggers for non-hunger eating and techniques for dealing with triggers will be explored. The purpose of this lesson is to provide patients with the opportunity to review the basic principles of the Pritikin Eating Plan, discuss the value of eating mindfully and how to measure internal cues of hunger and fullness using the Hunger Scale. Patients will also discuss reasons for non-hunger eating and learn strategies to use for controlling emotional eating.  Targeting Your Nutrition Priorities Clinical staff led group instruction and group discussion with PowerPoint presentation and patient guidebook. To enhance the learning environment the use of posters, models and videos may be added. Patients will learn how to determine their genetic susceptibility to disease by reviewing their family history. Patients will gain insight into the importance of diet as part of an overall healthy lifestyle in mitigating the impact of genetics and other environmental insults. The purpose of  this lesson is to provide patients with the opportunity to assess their personal nutrition priorities by looking at their family history, their own health history and current risk factors. Patients will also be able to discuss ways of prioritizing and modifying the Pritikin Eating Plan for their highest risk areas  Menu  Clinical staff led group instruction and group discussion with PowerPoint presentation and patient guidebook. To enhance the learning environment the use of posters, models and videos may be added. Using menus brought in from [employer], or printed from [employer], patients will apply the Pritikin dining out guidelines that were presented in the [employer] video. Patients will also be able to practice these guidelines in a variety of provided scenarios. The purpose of this lesson is to provide patients with the opportunity to practice hands-on learning of the Pritikin Dining Out guidelines with actual menus and practice scenarios.  Label Reading Clinical staff led group instruction and group discussion with PowerPoint presentation and patient guidebook. To enhance the learning environment the use of posters, models and videos may be added. Patients will review and discuss the Pritikin label reading guidelines presented in [employer]'s Label Reading Educational series video. Using fool labels brought in from local grocery stores and markets, patients will apply the label reading guidelines and determine if the packaged food meet the Pritikin guidelines. The purpose of this lesson is to provide patients with the opportunity to review, discuss, and practice hands-on learning of the Pritikin Label Reading guidelines with actual packaged food labels. Cooking School  [facility]'s [employer] are designed to teach patients ways to prepare quick, simple, and affordable recipes at home. The importance of nutrition's role in chronic disease risk reduction is  reflected in its emphasis in the overall Pritikin program. By learning how to prepare essential core Pritikin Eating Plan recipes, patients will increase control over what they eat; be able to customize the flavor of foods without the use of added salt, sugar, or fat; and improve the quality of the food they consume. By learning a set of core recipes which are easily assembled, quickly prepared, and affordable, patients are more likely to prepare more healthy foods at home. These workshops focus on convenient breakfasts, simple entres, side dishes, and desserts which can be prepared with minimal effort and are consistent with nutrition recommendations for cardiovascular risk reduction. Cooking [employer] are taught by a Armed forces logistics/support/administrative officer (RD) who has been trained  by the [employer] team. The chef or RD has a clear understanding of the importance of minimizing - if not completely eliminating - added fat, sugar, and sodium in recipes. Throughout the series of Cooking School Workshop sessions, patients will learn about healthy ingredients and efficient methods of cooking to build confidence in their capability to prepare    Cooking School weekly topics:  Adding Flavor- Sodium-Free  Fast and Healthy Breakfasts  Powerhouse Plant-Based Proteins  Satisfying Salads and Dressings  Simple Sides and Sauces  International Cuisine-Spotlight on the [employer] Zones  Delicious Desserts  Savory Soups  [employer] - Meals in a Astronomer Appetizers and Snacks  Comforting Weekend Breakfasts  One-Pot Wonders   Fast Evening Meals  Landscape architect Your Pritikin Plate  WORKSHOPS   Healthy Mindset (Psychosocial):  Focused Goals, Sustainable Changes Clinical staff led group instruction and group discussion with PowerPoint presentation and patient guidebook. To enhance the learning environment the use of posters, models and videos may be added. Patients will be able to  apply effective goal setting strategies to establish at least one personal goal, and then take consistent, meaningful action toward that goal. They will learn to identify common barriers to achieving personal goals and develop strategies to overcome them. Patients will also gain an understanding of how our mind-set can impact our ability to achieve goals and the importance of cultivating a positive and growth-oriented mind-set. The purpose of this lesson is to provide patients with a deeper understanding of how to set and achieve personal goals, as well as the tools and strategies needed to overcome common obstacles which may arise along the way.  From Head to Heart: The Power of a Healthy Outlook  Clinical staff led group instruction and group discussion with PowerPoint presentation and patient guidebook. To enhance the learning environment the use of posters, models and videos may be added. Patients will be able to recognize and describe the impact of emotions and mood on physical health. They will discover the importance of self-care and explore self-care practices which may work for them. Patients will also learn how to utilize the 4 C's to cultivate a healthier outlook and better manage stress and challenges. The purpose of this lesson is to demonstrate to patients how a healthy outlook is an essential part of maintaining good health, especially as they continue their cardiac rehab journey.  Healthy Sleep for a Healthy Heart Clinical staff led group instruction and group discussion with PowerPoint presentation and patient guidebook. To enhance the learning environment the use of posters, models and videos may be added. At the conclusion of this workshop, patients will be able to demonstrate knowledge of the importance of sleep to overall health, well-being, and quality of life. They will understand the symptoms of, and treatments for, common sleep disorders. Patients will also be able to identify daytime  and nighttime behaviors which impact sleep, and they will be able to apply these tools to help manage sleep-related challenges. The purpose of this lesson is to provide patients with a general overview of sleep and outline the importance of quality sleep. Patients will learn about a few of the most common sleep disorders. Patients will also be introduced to the concept of "sleep hygiene," and discover ways to self-manage certain sleeping problems through simple daily behavior changes. Finally, the workshop will motivate patients by clarifying the links between quality sleep and their goals of heart-healthy living.   Recognizing and Reducing Stress Clinical staff led  group instruction and group discussion with PowerPoint presentation and patient guidebook. To enhance the learning environment the use of posters, models and videos may be added. At the conclusion of this workshop, patients will be able to understand the types of stress reactions, differentiate between acute and chronic stress, and recognize the impact that chronic stress has on their health. They will also be able to apply different coping mechanisms, such as reframing negative self-talk. Patients will have the opportunity to practice a variety of stress management techniques, such as deep abdominal breathing, progressive muscle relaxation, and/or guided imagery.  The purpose of this lesson is to educate patients on the role of stress in their lives and to provide healthy techniques for coping with it.  Learning Barriers/Preferences:  Learning Barriers/Preferences - 02/24/23 1434       Learning Barriers/Preferences   Learning Barriers Hearing   bilateral hearing aids   Learning Preferences Written Material;Computer/Internet             Education Topics:  Knowledge Questionnaire Score:  Knowledge Questionnaire Score - 02/24/23 1433       Knowledge Questionnaire Score   Pre Score 16/24             Core Components/Risk  Factors/Patient Goals at Admission:  Personal Goals and Risk Factors at Admission - 02/24/23 1433       Core Components/Risk Factors/Patient Goals on Admission    Weight Management Weight Maintenance;Yes    Expected Outcomes Long Term: Adherence to nutrition and physical activity/exercise program aimed toward attainment of established weight goal;Weight Maintenance: Understanding of the daily nutrition guidelines, which includes 25-35% calories from fat, 7% or less cal from saturated fats, less than 200mg  cholesterol, less than 1.5gm of sodium, & 5 or more servings of fruits and vegetables daily;Understanding recommendations for meals to include 15-35% energy as protein, 25-35% energy from fat, 35-60% energy from carbohydrates, less than 200mg  of dietary cholesterol, 20-35 gm of total fiber daily;Understanding of distribution of calorie intake throughout the day with the consumption of 4-5 meals/snacks    Heart Failure Yes    Intervention Provide a combined exercise and nutrition program that is supplemented with education, support and counseling about heart failure. Directed toward relieving symptoms such as shortness of breath, decreased exercise tolerance, and extremity edema.    Expected Outcomes Improve functional capacity of life;Short term: Attendance in program 2-3 days a week with increased exercise capacity. Reported lower sodium intake. Reported increased fruit and vegetable intake. Reports medication compliance.;Short term: Daily weights obtained and reported for increase. Utilizing diuretic protocols set by physician.;Long term: Adoption of self-care skills and reduction of barriers for early signs and symptoms recognition and intervention leading to self-care maintenance.    Hypertension Yes    Intervention Provide education on lifestyle modifcations including regular physical activity/exercise, weight management, moderate sodium restriction and increased consumption of fresh fruit,  vegetables, and low fat dairy, alcohol moderation, and smoking cessation.;Monitor prescription use compliance.    Expected Outcomes Short Term: Continued assessment and intervention until BP is < 140/67mm HG in hypertensive participants. < 130/50mm HG in hypertensive participants with diabetes, heart failure or chronic kidney disease.;Long Term: Maintenance of blood pressure at goal levels.    Lipids Yes    Intervention Provide education and support for participant on nutrition & aerobic/resistive exercise along with prescribed medications to achieve LDL 70mg , HDL >40mg .    Expected Outcomes Short Term: Participant states understanding of desired cholesterol values and is compliant with medications prescribed. Participant is  following exercise prescription and nutrition guidelines.;Long Term: Cholesterol controlled with medications as prescribed, with individualized exercise RX and with personalized nutrition plan. Value goals: LDL < 70mg , HDL > 40 mg.             Core Components/Risk Factors/Patient Goals Review:   Goals and Risk Factor Review     Row Name 03/02/23 1659 03/31/23 1343 04/28/23 1156         Core Components/Risk Factors/Patient Goals Review   Personal Goals Review Weight Management/Obesity;Heart Failure;Hypertension;Lipids Weight Management/Obesity;Heart Failure;Hypertension;Lipids Weight Management/Obesity;Heart Failure;Hypertension;Lipids     Review Jennavecia started intensive cardiac rehab on 03/02/23 and did well with exercise, Vital signs were stable Brea is doing  well with exercise, Vital signs have been stable. Marissa Carroll is enjoying participating in cardiac rehab. Marissa Carroll water walks in the pool on the days she does not attend cardiac rehab Marissa Carroll is doing  well with exercise, Vital signs have been stable. Marissa Carroll is enjoying participating in cardiac rehab. Marissa Carroll will complete cardiac rehab on 05/15/23     Expected Outcomes Marissa Carroll will continue to participate in intensive cardiac rehab for  exercise, nutrition and lifestyle modifications Marissa Carroll will continue to participate in intensive cardiac rehab for exercise, nutrition and lifestyle modifications Marissa Carroll will continue to participate in intensive cardiac rehab for exercise, nutrition and lifestyle modifications              Core Components/Risk Factors/Patient Goals at Discharge (Final Review):   Goals and Risk Factor Review - 04/28/23 1156       Core Components/Risk Factors/Patient Goals Review   Personal Goals Review Weight Management/Obesity;Heart Failure;Hypertension;Lipids    Review Leyli is doing  well with exercise, Vital signs have been stable. Marissa Carroll is enjoying participating in cardiac rehab. Tunesia will complete cardiac rehab on 05/15/23    Expected Outcomes Fallen will continue to participate in intensive cardiac rehab for exercise, nutrition and lifestyle modifications             ITP Comments:  ITP Comments     Row Name 02/24/23 1047 03/02/23 1150 03/31/23 1339 04/28/23 1134     ITP Comments Introduction to [facility]in Education Program/ Intensive Cardiac Rehab. Initial Orientation Packet reviewed with the patient. 30 Day ITP Review. Carnell  started intensive cardiac rehab on 03/02/23 and did well with exercise 30 Day ITP Review. Kenitra has good attendance and participation in intensive cardiac rehab 30 Day ITP Review. Kalah has good attendance and participation in intensive cardiac rehab. Najma will complete cardiac rehab on 05/15/23             Comments: See ITP Comments

## 2023-04-29 ENCOUNTER — Encounter (HOSPITAL_COMMUNITY): Payer: Medicare Other

## 2023-05-01 ENCOUNTER — Encounter (HOSPITAL_COMMUNITY): Payer: Medicare Other

## 2023-05-04 ENCOUNTER — Encounter (HOSPITAL_COMMUNITY)
Admission: RE | Admit: 2023-05-04 | Discharge: 2023-05-04 | Disposition: A | Discharge status: Home / Self Care | Payer: Medicare Other | Source: Ambulatory Visit | Attending: Cardiology | Admitting: Cardiology

## 2023-05-04 DIAGNOSIS — Z955 Presence of coronary angioplasty implant and graft: Secondary | ICD-10-CM | POA: Diagnosis not present

## 2023-05-04 DIAGNOSIS — Z48812 Encounter for surgical aftercare following surgery on the circulatory system: Secondary | ICD-10-CM | POA: Diagnosis not present

## 2023-05-04 DIAGNOSIS — I214 Non-ST elevation (NSTEMI) myocardial infarction: Secondary | ICD-10-CM | POA: Diagnosis present

## 2023-05-04 DIAGNOSIS — I252 Old myocardial infarction: Secondary | ICD-10-CM | POA: Diagnosis not present

## 2023-05-06 ENCOUNTER — Encounter (HOSPITAL_COMMUNITY)
Admission: RE | Admit: 2023-05-06 | Discharge: 2023-05-06 | Disposition: A | Discharge status: Home / Self Care | Payer: Medicare Other | Source: Ambulatory Visit | Attending: Cardiology | Admitting: Cardiology

## 2023-05-06 ENCOUNTER — Telehealth (HOSPITAL_COMMUNITY): Payer: Self-pay | Admitting: Cardiology

## 2023-05-06 DIAGNOSIS — Z955 Presence of coronary angioplasty implant and graft: Secondary | ICD-10-CM | POA: Diagnosis not present

## 2023-05-06 DIAGNOSIS — Z48812 Encounter for surgical aftercare following surgery on the circulatory system: Secondary | ICD-10-CM | POA: Diagnosis not present

## 2023-05-06 DIAGNOSIS — I252 Old myocardial infarction: Secondary | ICD-10-CM | POA: Diagnosis not present

## 2023-05-06 DIAGNOSIS — I214 Non-ST elevation (NSTEMI) myocardial infarction: Secondary | ICD-10-CM

## 2023-05-06 NOTE — Telephone Encounter (Signed)
Medications reviewed. No interactions noted. She is ok to start Biotin OTC.

## 2023-05-06 NOTE — Telephone Encounter (Signed)
Pt reports since MI has noticed hair loss. Would like to start biotin OTC, concerns for interaction with current medication regimen.  Please advise

## 2023-05-06 NOTE — Telephone Encounter (Signed)
LM[facility]  Phone: [phone] (H)

## 2023-05-08 ENCOUNTER — Encounter (HOSPITAL_COMMUNITY): Admission: RE | Admit: 2023-05-08 | Payer: Medicare Other | Source: Ambulatory Visit

## 2023-05-08 DIAGNOSIS — I214 Non-ST elevation (NSTEMI) myocardial infarction: Secondary | ICD-10-CM

## 2023-05-08 DIAGNOSIS — Z955 Presence of coronary angioplasty implant and graft: Secondary | ICD-10-CM

## 2023-05-08 DIAGNOSIS — Z48812 Encounter for surgical aftercare following surgery on the circulatory system: Secondary | ICD-10-CM | POA: Diagnosis not present

## 2023-05-08 DIAGNOSIS — I252 Old myocardial infarction: Secondary | ICD-10-CM | POA: Diagnosis not present

## 2023-05-11 ENCOUNTER — Encounter (HOSPITAL_COMMUNITY)
Admission: RE | Admit: 2023-05-11 | Discharge: 2023-05-11 | Disposition: A | Discharge status: Home / Self Care | Payer: Medicare Other | Source: Ambulatory Visit | Attending: Cardiology | Admitting: Cardiology

## 2023-05-11 DIAGNOSIS — Z48812 Encounter for surgical aftercare following surgery on the circulatory system: Secondary | ICD-10-CM | POA: Diagnosis not present

## 2023-05-11 DIAGNOSIS — I214 Non-ST elevation (NSTEMI) myocardial infarction: Secondary | ICD-10-CM

## 2023-05-11 DIAGNOSIS — I252 Old myocardial infarction: Secondary | ICD-10-CM | POA: Diagnosis not present

## 2023-05-11 DIAGNOSIS — Z955 Presence of coronary angioplasty implant and graft: Secondary | ICD-10-CM | POA: Diagnosis not present

## 2023-05-11 NOTE — Progress Notes (Signed)
Cardiac Rehab Session Note  Patient Details  Name: Marissa Carroll MRN: [id number] Date of Birth: [birth year] Referring Provider:   Flowsheet Row INTENSIVE CARDIAC REHAB ORIENT from 02/24/2023 in [facility]  Referring Provider [name], MD       [name] c/o burning in her throat while performing her walk test. Pt stated that she rested and it went away. Pt stated this has happened before and it is intermittent. She stated nothing makes it worse. Pt moved to 2nd exercise equipment. Informed pt to inform staff if pain comes back. Pt understands w/o assistance.

## 2023-05-13 ENCOUNTER — Encounter (HOSPITAL_COMMUNITY)
Admission: RE | Admit: 2023-05-13 | Discharge: 2023-05-13 | Disposition: A | Discharge status: Home / Self Care | Payer: Medicare Other | Source: Ambulatory Visit | Attending: Cardiology | Admitting: Cardiology

## 2023-05-13 DIAGNOSIS — Z955 Presence of coronary angioplasty implant and graft: Secondary | ICD-10-CM

## 2023-05-13 DIAGNOSIS — Z48812 Encounter for surgical aftercare following surgery on the circulatory system: Secondary | ICD-10-CM | POA: Diagnosis not present

## 2023-05-13 DIAGNOSIS — I214 Non-ST elevation (NSTEMI) myocardial infarction: Secondary | ICD-10-CM

## 2023-05-13 DIAGNOSIS — I252 Old myocardial infarction: Secondary | ICD-10-CM | POA: Diagnosis not present

## 2023-05-13 NOTE — Telephone Encounter (Signed)
Detailed message left for patient.

## 2023-05-14 DIAGNOSIS — Z85828 Personal history of other malignant neoplasm of skin: Secondary | ICD-10-CM | POA: Diagnosis not present

## 2023-05-14 DIAGNOSIS — C44329 Squamous cell carcinoma of skin of other parts of face: Secondary | ICD-10-CM | POA: Diagnosis not present

## 2023-05-15 ENCOUNTER — Encounter (HOSPITAL_COMMUNITY)
Admission: RE | Admit: 2023-05-15 | Discharge: 2023-05-15 | Disposition: A | Discharge status: Home / Self Care | Payer: Medicare Other | Source: Ambulatory Visit | Attending: Cardiology | Admitting: Cardiology

## 2023-05-15 VITALS — BP 118/60 | HR 66 | Wt 129.6 lb

## 2023-05-15 DIAGNOSIS — Z955 Presence of coronary angioplasty implant and graft: Secondary | ICD-10-CM

## 2023-05-15 DIAGNOSIS — Z48812 Encounter for surgical aftercare following surgery on the circulatory system: Secondary | ICD-10-CM | POA: Diagnosis not present

## 2023-05-15 DIAGNOSIS — I214 Non-ST elevation (NSTEMI) myocardial infarction: Secondary | ICD-10-CM

## 2023-05-15 DIAGNOSIS — I252 Old myocardial infarction: Secondary | ICD-10-CM | POA: Diagnosis not present

## 2023-05-15 NOTE — Progress Notes (Addendum)
Discharge Progress Report  Patient Details  Name: TIJERA ALLAMAN MRN: [id number] Date of Birth: [birth year] Referring Provider:   Flowsheet Row INTENSIVE CARDIAC REHAB ORIENT from 02/24/2023 in [facility]  Referring Provider [name], MD        Number of Visits: 48  Reason for Discharge:  Patient reached a stable level of exercise. Patient independent in their exercise. Patient has met program and personal goals.  Smoking History:  Social History   Tobacco Use  Smoking Status Never  Smokeless Tobacco Never    Diagnosis:  11/14/22 NSTEMI (non-ST elevated myocardial infarction) (HCC)  11/14/22 Status post coronary artery stent placement  ADL UCSD:   Initial Exercise Prescription:  Initial Exercise Prescription - 02/24/23 1400       Date of Initial Exercise RX and Referring Provider   Date 02/24/23    Referring Provider [name], MD    Expected Discharge Date 05/08/23      Recumbant Bike   Level 1    RPM 60    Watts 25    Minutes 15    METs 1.7      NuStep   Level 2    SPM 75    Minutes 15    METs 1.7      Prescription Details   Frequency (times per week) 3    Duration Progress to 30 minutes of continuous aerobic without signs/symptoms of physical distress      Intensity   THRR 40-80% of Max Heartrate 51-102    Ratings of Perceived Exertion 11-13    Perceived Dyspnea 0-4      Progression   Progression Continue progressive overload as per policy without signs/symptoms or physical distress.      Resistance Training   Training Prescription Yes    Weight 2 lbs    Reps 10-15             Discharge Exercise Prescription (Final Exercise Prescription Changes):  Exercise Prescription Changes - 05/15/23 1300       Response to Exercise   Blood Pressure (Admit) 118/60    Blood Pressure (Exercise) 126/64    Blood Pressure (Exit) 114/60    Heart Rate (Admit) 66 bpm    Heart Rate  (Exercise) 80 bpm    Heart Rate (Exit) 75 bpm    Rating of Perceived Exertion (Exercise) 11    Symptoms none    Comments Pt last day in CRP2 program    Duration Continue with 30 min of aerobic exercise without signs/symptoms of physical distress.    Intensity THRR unchanged      Progression   Progression Continue to progress workloads to maintain intensity without signs/symptoms of physical distress.    Average METs 3.2      Resistance Training   Training Prescription Yes    Weight 3    Reps 10-15    Time 10 Minutes      Interval Training   Interval Training No      Recumbant Bike   Level 2    RPM 80    Watts 24    Minutes 15    METs 3.1      NuStep   Level 4    SPM 86    Minutes 15    METs 3.5      Home Exercise Plan   Plans to continue exercise at Home (comment)   wallking in pool   Frequency Add  3 additional days to program exercise sessions.    Initial Home Exercises Provided 03/23/23             Functional Capacity:  6 Minute Walk     Row Name 02/24/23 1143 05/11/23 1046       6 Minute Walk   Phase Initial Discharge    Distance 1274 feet 1418 feet    Distance % Change -- 11.3 %    Distance Feet Change -- 144 ft    Walk Time 6 minutes 6 minutes    # of Rest Breaks 0 0    MPH 2.41 2.68    METS 1.7 1.72    RPE 11 11    Perceived Dyspnea  0 0    VO2 Peak 5.8 6.01    Symptoms Yes (comment) Yes (comment)    Comments Bilateral Hip pain 3/10 Patient c/o burning in throat, resolved with rest.    Resting HR 64 bpm 58 bpm    Resting BP 88/50 108/50    Resting Oxygen Saturation  99 % --    Exercise Oxygen Saturation  during 6 min walk 100 % 99 %    Max Ex. HR 83 bpm 79 bpm    Max Ex. BP 126/70 108/62    2 Minute Post BP 110/68 98/56             Psychological, QOL, Others - Outcomes: PHQ 2/9:    05/08/2023    2:27 PM 02/24/2023    2:23 PM  Depression screen PHQ 2/9  Decreased Interest 0 0  Down, Depressed, Hopeless 0 0  PHQ - 2 Score 0 0   Altered sleeping 0 0  Tired, decreased energy 1 0  Change in appetite 0 0  Feeling bad or failure about yourself  0 0  Trouble concentrating 0 0  Moving slowly or fidgety/restless 0 0  Suicidal thoughts 0 0  PHQ-9 Score 1 0  Difficult doing work/chores  Not difficult at all    Quality of Life:  Quality of Life - 05/11/23 1721       Quality of Life   Select Quality of Life      Quality of Life Scores   Health/Function Pre 23.38 %    Health/Function Post 26.45 %    Health/Function % Change 13.13 %    Socioeconomic Pre 30 %    Socioeconomic Post 23.13 %    Socioeconomic % Change  -22.9 %    Psych/Spiritual Pre 29.64 %    Psych/Spiritual Post 29.14 %    Psych/Spiritual % Change -1.69 %    Family Pre 30 %    Family Post 30 %    Family % Change 0 %    GLOBAL Pre 29.19 %    GLOBAL Post 27.21 %    GLOBAL % Change -6.78 %             Personal Goals: Goals established at orientation with interventions provided to work toward goal.  Personal Goals and Risk Factors at Admission - 02/24/23 1433       Core Components/Risk Factors/Patient Goals on Admission    Weight Management Weight Maintenance;Yes    Expected Outcomes Long Term: Adherence to nutrition and physical activity/exercise program aimed toward attainment of established weight goal;Weight Maintenance: Understanding of the daily nutrition guidelines, which includes 25-35% calories from fat, 7% or less cal from saturated fats, less than 200mg  cholesterol, less than 1.5gm of sodium, & 5 or more servings of  fruits and vegetables daily;Understanding recommendations for meals to include 15-35% energy as protein, 25-35% energy from fat, 35-60% energy from carbohydrates, less than 200mg  of dietary cholesterol, 20-35 gm of total fiber daily;Understanding of distribution of calorie intake throughout the day with the consumption of 4-5 meals/snacks    Heart Failure Yes    Intervention Provide a combined exercise and nutrition  program that is supplemented with education, support and counseling about heart failure. Directed toward relieving symptoms such as shortness of breath, decreased exercise tolerance, and extremity edema.    Expected Outcomes Improve functional capacity of life;Short term: Attendance in program 2-3 days a week with increased exercise capacity. Reported lower sodium intake. Reported increased fruit and vegetable intake. Reports medication compliance.;Short term: Daily weights obtained and reported for increase. Utilizing diuretic protocols set by physician.;Long term: Adoption of self-care skills and reduction of barriers for early signs and symptoms recognition and intervention leading to self-care maintenance.    Hypertension Yes    Intervention Provide education on lifestyle modifcations including regular physical activity/exercise, weight management, moderate sodium restriction and increased consumption of fresh fruit, vegetables, and low fat dairy, alcohol moderation, and smoking cessation.;Monitor prescription use compliance.    Expected Outcomes Short Term: Continued assessment and intervention until BP is < 140/93mm HG in hypertensive participants. < 130/69mm HG in hypertensive participants with diabetes, heart failure or chronic kidney disease.;Long Term: Maintenance of blood pressure at goal levels.    Lipids Yes    Intervention Provide education and support for participant on nutrition & aerobic/resistive exercise along with prescribed medications to achieve LDL 70mg , HDL >40mg .    Expected Outcomes Short Term: Participant states understanding of desired cholesterol values and is compliant with medications prescribed. Participant is following exercise prescription and nutrition guidelines.;Long Term: Cholesterol controlled with medications as prescribed, with individualized exercise RX and with personalized nutrition plan. Value goals: LDL < 70mg , HDL > 40 mg.              Personal Goals  Discharge:  Goals and Risk Factor Review     Row Name 03/02/23 1659 03/31/23 1343 04/28/23 1156 05/15/23 1434       Core Components/Risk Factors/Patient Goals Review   Personal Goals Review Weight Management/Obesity;Heart Failure;Hypertension;Lipids Weight Management/Obesity;Heart Failure;Hypertension;Lipids Weight Management/Obesity;Heart Failure;Hypertension;Lipids Weight Management/Obesity;Heart Failure;Hypertension;Lipids    Review Daysy started intensive cardiac rehab on 03/02/23 and did well with exercise, Vital signs were stable Arij is doing  well with exercise, Vital signs have been stable. Anara is enjoying participating in cardiac rehab. Claree water walks in the pool on the days she does not attend cardiac rehab Kadeja is doing  well with exercise, Vital signs have been stable. Shaneqwa is enjoying participating in cardiac rehab. Brigett will complete cardiac rehab on 05/15/23 Birkley graduated today, 05/15/2023 from the Intensive/Traditional CR program completing 48 sessions. Core component goal met for weight maintenance. Eliane's weight has been stable throughout the program. She has modified her nutrition and lifestyle. Chase met her goal for blood pressure control. BPs have been WNL while at rehab and she is compliant with meds. Goal has also been met for lipid control. Last lipid panel was 02/13/23 and WNL. She is also complaint with taking her lipid medication. Goal met for heart failure control. Cairo is compliant with her meds and has not had a HF exacerbation while in the program.  Post-graduation Maryori plans to play pickleball, socialize with friends, spend time with family, and is involved with her church. She states she is going  to also get back to playing tennis if possible. This is still a goal she is working on. We are proud of the success Lashuna has made in the program!    Expected Outcomes Cosandra will continue to participate in intensive cardiac rehab for exercise, nutrition and lifestyle modifications  Afua will continue to participate in intensive cardiac rehab for exercise, nutrition and lifestyle modifications Yasmin will continue to participate in intensive cardiac rehab for exercise, nutrition and lifestyle modifications Kanyah will continue to exercise, modify her nutrition and lifestyle post CR graduation             Exercise Goals and Review:  Exercise Goals     Row Name 02/24/23 1429             Exercise Goals   Increase Physical Activity Yes       Intervention Provide advice, education, support and counseling about physical activity/exercise needs.;Develop an individualized exercise prescription for aerobic and resistive training based on initial evaluation findings, risk stratification, comorbidities and participant's personal goals.       Expected Outcomes Short Term: Attend rehab on a regular basis to increase amount of physical activity.;Long Term: Add in home exercise to make exercise part of routine and to increase amount of physical activity.;Long Term: Exercising regularly at least 3-5 days a week.       Increase Strength and Stamina Yes       Intervention Provide advice, education, support and counseling about physical activity/exercise needs.;Develop an individualized exercise prescription for aerobic and resistive training based on initial evaluation findings, risk stratification, comorbidities and participant's personal goals.       Expected Outcomes Short Term: Increase workloads from initial exercise prescription for resistance, speed, and METs.;Short Term: Perform resistance training exercises routinely during rehab and add in resistance training at home;Long Term: Improve cardiorespiratory fitness, muscular endurance and strength as measured by increased METs and functional capacity ( )       Able to understand and use rate of perceived exertion (RPE) scale Yes       Intervention Provide education and explanation on how to use RPE scale       Expected Outcomes  Short Term: Able to use RPE daily in rehab to express subjective intensity level;Long Term:  Able to use RPE to guide intensity level when exercising independently       Knowledge and understanding of Target Heart Rate Range (THRR) Yes       Intervention Provide education and explanation of THRR including how the numbers were predicted and where they are located for reference       Expected Outcomes Short Term: Able to state/look up THRR;Short Term: Able to use daily as guideline for intensity in rehab;Long Term: Able to use THRR to govern intensity when exercising independently       Understanding of Exercise Prescription Yes       Intervention Provide education, explanation, and written materials on patient's individual exercise prescription       Expected Outcomes Short Term: Able to explain program exercise prescription;Long Term: Able to explain home exercise prescription to exercise independently                Exercise Goals Re-Evaluation:  Exercise Goals Re-Evaluation     Row Name 03/02/23 1645 03/18/23 1112 03/23/23 1057 04/22/23 1050 05/11/23 1130     Exercise Goal Re-Evaluation   Exercise Goals Review Increase Physical Activity;Able to understand and use rate of perceived exertion (RPE) scale;Increase Strength and  Stamina Increase Physical Activity;Able to understand and use rate of perceived exertion (RPE) scale;Increase Strength and Stamina Increase Physical Activity;Able to understand and use rate of perceived exertion (RPE) scale;Increase Strength and Stamina;Knowledge and understanding of Target Heart Rate Range (THRR);Understanding of Exercise Prescription Increase Physical Activity;Able to understand and use rate of perceived exertion (RPE) scale;Increase Strength and Stamina;Knowledge and understanding of Target Heart Rate Range (THRR);Understanding of Exercise Prescription Increase Physical Activity;Able to understand and use rate of perceived exertion (RPE) scale;Increase  Strength and Stamina;Knowledge and understanding of Target Heart Rate Range (THRR);Understanding of Exercise Prescription   Comments Patient able to understand and use RPE scale appropriately. Romya is walking in the pool 30 minutes 3 days/week in addition to exercise at cardiac rehab. Reviewed exercise prescription with Erskine [employer]. She  is walking in her outdoor pool 30 minutes 3 days/week. her goal is to know what to do to stay healthy. Jeramie is making steady progess with exercise. She is consistently walking in her pool in addition to exercise at cardiac rehab. She plans to resume walking on the street in addition to walking in the pool, weather permitting. She still gets tired and sits for 30 minutes and is ready to move again. She recently increased her hand weights at cardiac rehab from 2 to 3 lbs. Yoni is scheduled to complete cardiac rehab this week. Her functional capacity increased 11% as measured by .   Expected Outcomes Progress workloads as tolerated to help increase strength and stamina. Continue 30 minutes of aerobic exercise 6 days/week. Continue 30 minutes of aerobic exercise 6 days/week. Progress workloads as tolerated. Add street walking to pool walking at home. Deetta will continue walking in the pool 30 minutes 3 days/week to maintain health and fitness gains.    Row Name 05/13/23 1059 05/15/23 1330           Exercise Goal Re-Evaluation   Exercise Goals Review Increase Physical Activity;Able to understand and use rate of perceived exertion (RPE) scale;Increase Strength and Stamina;Knowledge and understanding of Target Heart Rate Range (THRR);Understanding of Exercise Prescription Increase Physical Activity;Able to understand and use rate of perceived exertion (RPE) scale;Increase Strength and Stamina;Knowledge and understanding of Target Heart Rate Range (THRR);Understanding of Exercise Prescription      Comments Neisha will complete the cardiac rehab program on Friday and progressed well  achieving 3.1 METs with exercise. She plans to continue stretching, walking out doors and in the pool as her mode of aerobic exercise. She has hand weights at home and will start using them for her resistance training 2-3 days/week. She belongs to a club where she can exercise indoors when the weather is inclement. She prefers walking to using machines. Kelin completed the cardiac rehab program today and progressed well achieving 3.3 METs with exercise. She plans to continue stretching, walking out doors and in the pool as her mode of aerobic exercise. She has hand weights at home and will start using them for her resistance training 2-3 days/week. She belongs to a club where she can exercise indoors when the weather is inclement. She prefers walking to using machines. Ashika completed 28 exercise sessions total.      Expected Outcomes Janeiry will continue walking out doors and in the pool 30 minutes at least 3 days/week to maintain health and fitness gains. Daryla will continue walking out doors and in the pool 30 minutes at least 3 days/week to maintain health and fitness gains.  Nutrition & Weight - Outcomes:  Pre Biometrics - 02/24/23 1025       Pre Biometrics   Waist Circumference 35 inches    Hip Circumference 37.5 inches    Waist to Hip Ratio 0.93 %    Triceps Skinfold 11 mm    % Body Fat 33.4 %    Grip Strength 17 kg    Flexibility 16.75 in    Single Leg Stand 2.37 seconds              Nutrition:  Nutrition Therapy & Goals - 05/15/23 1325       Nutrition Therapy   Diet Heart healthy diet    Drug/Food Interactions Statins/Certain Fruits      Personal Nutrition Goals   Nutrition Goal Patient to identify strategies for reducing cardiovascular risk by attending the [employer] education and nutrition series weekly.   goal in action.   Personal Goal #2 Patient to improve diet quality by using the plate method as a guide for meal planning to include lean protein/plant  protein, fruits, vegetables, whole grains, nonfat dairy as part of a well-balanced diet.   goal in action.   Personal Goal #3 Patient to reduce sodium to 1500mg  per day.   goal in action.   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 Goals in action. Olivea has attended the [employer] education series regularly. She continues to eat a wide variety of foods and has good understanding of reading food labels for sodium, saturated fat intake, and benefits of high fiber intake. She is down 2.4# since starting with our program. Patient will continue to benefit from adherance to  nutrition recommendations, exercise recommendations, and lifestyle modification.      Intervention Plan   Intervention Prescribe, educate and counsel regarding individualized specific dietary modifications aiming towards targeted core components such as weight, hypertension, lipid management, diabetes, heart failure and other comorbidities.;Nutrition handout(s) given to patient.    Expected Outcomes Short Term Goal: Understand basic principles of dietary content, such as calories, fat, sodium, cholesterol and nutrients.;Long Term Goal: Adherence to prescribed nutrition plan.             Nutrition Discharge:  Nutrition Assessments - 05/08/23 1338       Rate Your Plate Scores   Pre Score 70    Post Score 75             Education Questionnaire Score:  Knowledge Questionnaire Score - 05/08/23 1427       Knowledge Questionnaire Score   Pre Score 16/24    Post Score 21/24            Lilybelle graduated today, 05/15/2023 from the Intensive/Traditional CR program completing 48 total exercise and education sessions. Goals reviewed with patient; copy given to patient. Pt maintained good attendance and progressed during her participation in rehab as evidenced by increased METs and workload. Abigaile improved on her by 11.3% Initial PHQ-2/PHQ-9 score 0/0, post scores 0/1. Pt states her mental health is stable. RN reviewed her  Quality-of-Life assessment, overall score decreased from 29.19% to 27.2%. Scores above 19% indicating a positive quality of life. No areas of concern. Pt declines any needs at the time of graduation. RN also reviewed and updated medication list. Core Component Goals: goal met for weight maintenance. Chiann's weight has been stable throughout the program. She has modified her nutrition and lifestyle. Artisha met her goal for blood pressure control. BPs have been WNL while at rehab and she is compliant with meds. Goal has  also been met for lipid control. Last lipid panel was 02/13/23 and WNL. She is also complaint with taking her lipid medication. Goal met for heart failure control. Zyniah is compliant with her meds and has not had a HF exacerbation while in the program.  Post-graduation Aylla plans to play pickleball, socialize with friends, spend time with family, and is involved with her church. She states she is going to also get back to playing tennis if possible. This is still a goal she is working on. We are proud of the success Jomarie has made in the program!

## 2023-05-18 ENCOUNTER — Other Ambulatory Visit (HOSPITAL_COMMUNITY): Payer: Self-pay | Admitting: Pharmacist

## 2023-05-18 DIAGNOSIS — Z79899 Other long term (current) drug therapy: Secondary | ICD-10-CM

## 2023-05-18 DIAGNOSIS — I5022 Chronic systolic (congestive) heart failure: Secondary | ICD-10-CM

## 2023-05-18 MED ORDER — SPIRONOLACTONE 25 MG PO TABS: 25.0000 mg | ORAL_TABLET | Freq: Every day | ORAL | 3 refills | Status: DC

## 2023-05-18 NOTE — Progress Notes (Signed)
Resending correct spironolactone prescription (with instructions to take 25 mg daily) to [facility]. Per patient, they have only been filling the 12.5 mg daily prescription, which is an old dose.   Karle Plumber, PharmD, BCPS, BCCP, CPP [facility] Pharmacist [phone]

## 2023-06-25 DIAGNOSIS — H04123 Dry eye syndrome of bilateral lacrimal glands: Secondary | ICD-10-CM | POA: Diagnosis not present

## 2023-06-25 DIAGNOSIS — H524 Presbyopia: Secondary | ICD-10-CM | POA: Diagnosis not present

## 2023-06-25 DIAGNOSIS — Z961 Presence of intraocular lens: Secondary | ICD-10-CM | POA: Diagnosis not present

## 2023-06-30 ENCOUNTER — Ambulatory Visit: Payer: Medicare Other | Attending: Cardiovascular Disease | Admitting: Cardiovascular Disease

## 2023-07-15 DIAGNOSIS — Z1389 Encounter for screening for other disorder: Secondary | ICD-10-CM | POA: Diagnosis not present

## 2023-07-15 DIAGNOSIS — I10 Essential (primary) hypertension: Secondary | ICD-10-CM | POA: Diagnosis not present

## 2023-07-20 DIAGNOSIS — D6869 Other thrombophilia: Secondary | ICD-10-CM | POA: Diagnosis not present

## 2023-07-20 DIAGNOSIS — I480 Paroxysmal atrial fibrillation: Secondary | ICD-10-CM | POA: Diagnosis not present

## 2023-07-20 DIAGNOSIS — E785 Hyperlipidemia, unspecified: Secondary | ICD-10-CM | POA: Diagnosis not present

## 2023-07-20 DIAGNOSIS — C50919 Malignant neoplasm of unspecified site of unspecified female breast: Secondary | ICD-10-CM | POA: Diagnosis not present

## 2023-07-20 DIAGNOSIS — Z1331 Encounter for screening for depression: Secondary | ICD-10-CM | POA: Diagnosis not present

## 2023-07-20 DIAGNOSIS — Z1339 Encounter for screening examination for other mental health and behavioral disorders: Secondary | ICD-10-CM | POA: Diagnosis not present

## 2023-07-20 DIAGNOSIS — I252 Old myocardial infarction: Secondary | ICD-10-CM | POA: Diagnosis not present

## 2023-07-20 DIAGNOSIS — R82998 Other abnormal findings in urine: Secondary | ICD-10-CM | POA: Diagnosis not present

## 2023-07-20 DIAGNOSIS — Z853 Personal history of malignant neoplasm of breast: Secondary | ICD-10-CM | POA: Diagnosis not present

## 2023-07-20 DIAGNOSIS — I25118 Atherosclerotic heart disease of native coronary artery with other forms of angina pectoris: Secondary | ICD-10-CM | POA: Diagnosis not present

## 2023-07-20 DIAGNOSIS — Z23 Encounter for immunization: Secondary | ICD-10-CM | POA: Diagnosis not present

## 2023-07-20 DIAGNOSIS — Z0000 Encounter for general adult medical examination without abnormal findings: Secondary | ICD-10-CM | POA: Diagnosis not present

## 2023-07-20 DIAGNOSIS — I10 Essential (primary) hypertension: Secondary | ICD-10-CM | POA: Diagnosis not present

## 2023-08-17 ENCOUNTER — Encounter (HOSPITAL_COMMUNITY): Payer: Self-pay | Admitting: Cardiology

## 2023-08-17 ENCOUNTER — Ambulatory Visit (HOSPITAL_COMMUNITY)
Admission: RE | Admit: 2023-08-17 | Discharge: 2023-08-17 | Disposition: A | Discharge status: Home / Self Care | Payer: Medicare Other | Source: Ambulatory Visit | Attending: Cardiology | Admitting: Cardiology

## 2023-08-17 VITALS — BP 110/60 | HR 62 | Wt 129.0 lb

## 2023-08-17 DIAGNOSIS — Z7901 Long term (current) use of anticoagulants: Secondary | ICD-10-CM | POA: Diagnosis not present

## 2023-08-17 DIAGNOSIS — I252 Old myocardial infarction: Secondary | ICD-10-CM | POA: Diagnosis not present

## 2023-08-17 DIAGNOSIS — I2510 Atherosclerotic heart disease of native coronary artery without angina pectoris: Secondary | ICD-10-CM | POA: Diagnosis not present

## 2023-08-17 DIAGNOSIS — I110 Hypertensive heart disease with heart failure: Secondary | ICD-10-CM | POA: Diagnosis not present

## 2023-08-17 DIAGNOSIS — R9431 Abnormal electrocardiogram [ECG] [EKG]: Secondary | ICD-10-CM | POA: Diagnosis not present

## 2023-08-17 DIAGNOSIS — Z8674 Personal history of sudden cardiac arrest: Secondary | ICD-10-CM | POA: Insufficient documentation

## 2023-08-17 DIAGNOSIS — I10 Essential (primary) hypertension: Secondary | ICD-10-CM | POA: Diagnosis present

## 2023-08-17 DIAGNOSIS — Z7902 Long term (current) use of antithrombotics/antiplatelets: Secondary | ICD-10-CM | POA: Diagnosis not present

## 2023-08-17 DIAGNOSIS — I255 Ischemic cardiomyopathy: Secondary | ICD-10-CM | POA: Diagnosis not present

## 2023-08-17 DIAGNOSIS — I5022 Chronic systolic (congestive) heart failure: Secondary | ICD-10-CM | POA: Diagnosis not present

## 2023-08-17 DIAGNOSIS — Z853 Personal history of malignant neoplasm of breast: Secondary | ICD-10-CM | POA: Insufficient documentation

## 2023-08-17 DIAGNOSIS — I447 Left bundle-branch block, unspecified: Secondary | ICD-10-CM | POA: Insufficient documentation

## 2023-08-17 DIAGNOSIS — I480 Paroxysmal atrial fibrillation: Secondary | ICD-10-CM | POA: Diagnosis not present

## 2023-08-17 LAB — BASIC METABOLIC PANEL
Anion gap: 7 (ref 5–15)
BUN: 27 mg/dL — ABNORMAL HIGH (ref 8–23)
CO2: 27 mmol/L (ref 22–32)
Calcium: 11 mg/dL — ABNORMAL HIGH (ref 8.9–10.3)
Chloride: 102 mmol/L (ref 98–111)
Creatinine, Ser: 1.12 mg/dL — ABNORMAL HIGH (ref 0.44–1.00)
GFR, Estimated: 46 mL/min — ABNORMAL LOW (ref >60)
Glucose, Bld: 87 mg/dL (ref 70–99)
Potassium: 4.2 mmol/L (ref 3.5–5.1)
Sodium: 136 mmol/L (ref 135–145)

## 2023-08-17 LAB — LIPID PANEL
Cholesterol: 108 mg/dL (ref 0–200)
HDL: 45 mg/dL (ref >40)
LDL Cholesterol: 48 mg/dL (ref 0–99)
Total CHOL/HDL Ratio: 2.4 {ratio}
Triglycerides: 74 mg/dL (ref <150)
VLDL: 15 mg/dL (ref 0–40)

## 2023-08-17 LAB — BRAIN NATRIURETIC PEPTIDE: B Natriuretic Peptide: 463.5 pg/mL — ABNORMAL HIGH (ref 0.0–100.0)

## 2023-08-17 LAB — CBC
HCT: 41.2 % (ref 36.0–46.0)
Hemoglobin: 13.5 g/dL (ref 12.0–15.0)
MCH: 30.5 pg (ref 26.0–34.0)
MCHC: 32.8 g/dL (ref 30.0–36.0)
MCV: 93 fL (ref 80.0–100.0)
Platelets: 157 10*3/uL (ref 150–400)
RBC: 4.43 MIL/uL (ref 3.87–5.11)
RDW: 13.2 % (ref 11.5–15.5)
WBC: 6.9 10*3/uL (ref 4.0–10.5)
nRBC: 0 % (ref 0.0–0.2)

## 2023-08-17 MED ORDER — FUROSEMIDE 20 MG PO TABS: 20.0000 mg | ORAL_TABLET | Freq: Every day | ORAL | 3 refills | Status: DC

## 2023-08-17 MED ORDER — CARVEDILOL 6.25 MG PO TABS: 6.2500 mg | ORAL_TABLET | Freq: Two times a day (BID) | ORAL | 3 refills | Status: DC

## 2023-08-17 MED ORDER — CLOPIDOGREL BISULFATE 75 MG PO TABS: 75.0000 mg | ORAL_TABLET | Freq: Every day | ORAL | 0 refills | Status: DC

## 2023-08-17 NOTE — Patient Instructions (Signed)
STOP Plavix on 11/28/23.  INCREASE Carvedilol to 6.25 mg Twice daily  DECREASE Lasix to 20 mg daily.  Labs done today, your results will be available in MyChart, we will contact you for abnormal readings.  Your physician has requested that you have an echocardiogram. Echocardiography is a painless test that uses sound waves to create images of your heart. It provides your doctor with information about the size and shape of your heart and how well your heart's chambers and valves are working. This procedure takes approximately one hour. There are no restrictions for this procedure. Please do NOT wear cologne, perfume, aftershave, or lotions (deodorant is allowed). Please arrive 15 minutes prior to your appointment time.  Please note: We ask at that you not bring children with you during ultrasound (echo/ vascular) testing. Due to room size and safety concerns, children are not allowed in the ultrasound rooms during exams. Our front office staff cannot provide observation of children in our lobby area while testing is being conducted. An adult accompanying a patient to their appointment will only be allowed in the ultrasound room at the discretion of the ultrasound technician under special circumstances. We apologize for any inconvenience.  Your physician recommends that you schedule a follow-up appointment in: 5 months with an echocardiogram ( April 2025) **PLEASE CALL THE OFFICE IN FEBRUARY 2025 TO ARRANGE YOUR FOLLOW UP APPOINTMENT. **  If you have any questions or concerns before your next appointment please send [place] a message through [place] or call our office at [phone].    TO LEAVE A MESSAGE FOR THE NURSE SELECT OPTION 2, PLEASE LEAVE A MESSAGE INCLUDING: YOUR NAME DATE OF BIRTH CALL BACK NUMBER REASON FOR CALL**this is important as we prioritize the call backs  YOU WILL RECEIVE A CALL BACK THE SAME DAY AS LONG AS YOU CALL BEFORE 4:00 PM  At the [facility], you  and your health needs are our priority. As part of our continuing mission to provide you with exceptional heart care, we have created designated Provider Care Teams. These Care Teams include your primary Cardiologist (physician) and Advanced Practice Providers (APPs- Physician Assistants and Nurse Practitioners) who all work together to provide you with the care you need, when you need it.   You may see any of the following providers on your designated Care Team at your next follow up: Dr Arvilla Meres Dr [name] Dr. Dorthula Nettles Dr. Clearnce Hasten Amy Filbert Schilder, NP Robbie Lis, [place] [facility] [place], [place] Brynda Peon, NP [place] Lee, NP Karle Plumber, PharmD   Please be sure to bring in all your medications bottles to every appointment.    Thank you for choosing [facility]

## 2023-08-17 NOTE — Progress Notes (Signed)
PCP: Geoffry Paradise, MD Cardiology: Dr. Shirlee Latch  89 y.o. with history of breast cancer, chronic LBBB, and HTN was admitted to a hospital in [place], [place] with NSTEMI and VF arrest in 2/24.  She was referred by Dr. Jacky Kindle for evaluation of CHF, atrial fibrillation, and acute systolic CHF.  Prior to MI in 2/24, patient was quite healthy.  She played tennis and pickleball. She had no exertional dyspnea or chest pain.  She had a DVT in 2019 after a long car trip.  She had also, of note, received Adriamycin years ago with treatment of breast cancer.   In 2/24, she developed severe "heartburn" one evening.  Meds for heartburn did not help.  Pain eventually eased off and she was able to sleep.  However, chest pain returned again the next day and she went to the hospital.  While checking into the ER, she had a VF arrest requiring defibrillation and CPR. She broke several ribs. She was taken for cath, this showed severe stenosis in LCx that was treated with DES.  She developed atrial fibrillation while in the hospital and Eliquis was started.  She also was noted to have CHF and received IV Lasix. She was discharged with a Lifevest. At my initial visit with her, she was short of breath and volume overloaded, Lasix was increased.  Echo in 3/24 showed EF 30-35%, moderate infarct-related MR, normal RV.   Echo 5/24 showed  EF 35-40% with basal to mid inferolateral and anterolateral akinesis, RV normal, PASP 31 mmHg, trivial MR. LifeVest removed.  Today she returns for HF follow up. She seems to be doing quite well.  She walks for exercise, plays golf, and hits tennis balls on the ball machine.  No exertional dyspnea or chest pain. No trouble walking up stairs, hills.  No orthopnea/PND. No lightheadedness, palpitations.  No BRBPR/melena.   ECG (personally reviewed): NSR, IVCD 126 msec  Labs (2/24): LDL 69, HDL 36 Labs (5/24): K 3.9, creatinine 1.16, BNP 331, hgb 13.1 Labs (5/24): K 3.5, creatinine 1.26, LDL  50  PMH: 1. LBBB: Chronic 2. HTN 3. Breast cancer: Treated in 2008 with surgery and radiation.  She had Adriamycin.  4. Hyperlipidemia 5. DVT: 2019, in setting of long car ride.  6. Atrial fibrillation: Paroxysmal.  First noted during admission for MI in 2024.  7. CAD: NSTEMI 2/24, treated in [place], [place].  Cath showed severe stenosis in LCx, she had DES placed.  8. Chronic systolic CHF: Echo in 9/19 with EF 50-55%.  Ischemic cardiomyopathy.  Echo from 2/24 in [place] per family's report showed a weak heart.  - Echo (3/24): EF 30-35%, moderate infarct-related MR, normal RV.  - Echo (5/24): EF 35-40% with basal to mid inferolateral and anterolateral akinesis, RV normal, PASP 31 mmHg, trivial MR.  9. VF arrest: In 2/24 in the setting of MI.    SH: Widow, lives in [place].  2 children.  No smoking.  No ETOH.   FH: Son with CABG.   ROS: All systems reviewed and negative except as per HPI.   Current Outpatient Medications  Medication Sig Dispense Refill   apixaban (ELIQUIS) 5 MG TABS tablet Take 1 tablet (5 mg total) by mouth 2 (two) times daily. 60 tablet 11   atorvastatin (LIPITOR) 40 MG tablet Take 1 tablet (40 mg total) by mouth daily. 90 tablet 3   calcium-vitamin D (OSCAL WITH D) 500-5 MG-MCG tablet Take 1 tablet by mouth.     dapagliflozin propanediol (FARXIGA) 10  MG TABS tablet Take 1 tablet (10 mg total) by mouth daily before breakfast. 30 tablet 11   diazepam (VALIUM) 5 MG tablet As needed     Multiple Vitamins-Minerals (EYE VITAMINS PO) Take 1 tablet by mouth daily.      nitroGLYCERIN (NITROSTAT) 0.4 MG SL tablet Place 1 tablet (0.4 mg total) under the tongue every 5 (five) minutes as needed for chest pain. 90 tablet 3   spironolactone (ALDACTONE) 25 MG tablet Take 1 tablet (25 mg total) by mouth daily. 90 tablet 3   valsartan (DIOVAN) 40 MG tablet Take 0.5 tablets (20 mg total) by mouth 2 (two) times daily. 90 tablet 3   carvedilol (COREG) 6.25 MG tablet Take 1 tablet (6.25  mg total) by mouth 2 (two) times daily with a meal. 180 tablet 3   clopidogrel (PLAVIX) 75 MG tablet Take 1 tablet (75 mg total) by mouth daily. 103 tablet 0   furosemide (LASIX) 20 MG tablet Take 1 tablet (20 mg total) by mouth daily. 90 tablet 3   No current facility-administered medications for this encounter.   Wt Readings from Last 3 Encounters:  08/17/23 58.5 kg (129 lb)  05/15/23 58.8 kg (129 lb 10.1 oz)  03/17/23 58.5 kg (129 lb)   BP 110/60   Pulse 62   Wt 58.5 kg (129 lb)   SpO2 99%   BMI 22.14 kg/m  General: NAD Neck: No JVD, no thyromegaly or thyroid nodule.  Lungs: Clear to auscultation bilaterally with normal respiratory effort. CV: Nondisplaced PMI.  Heart regular S1/S2, no S3/S4, no murmur.  No peripheral edema.  No carotid bruit.  Normal pedal pulses.  Abdomen: Soft, nontender, no hepatosplenomegaly, no distention.  Skin: Intact without lesions or rashes.  Neurologic: Alert and oriented x 3.  Psych: Normal affect. Extremities: No clubbing or cyanosis.  HEENT: Normal.   Assessment/Plan: 1. CAD: NSTEMI in 2/24 with DES to LCx.  Complicated by VF arrest, acute systolic CHF, and atrial fibrillation.  No further chest pain.  ECG with LBBB-like IVCD.  - Continue Plavix 75 daily.  She is not on ASA given Eliquis use. Will continue Plavix for up to a year as long as no bleeding issues => stop 11/28/23.  - Continue atorvastatin 40 mg daily, check lipids today.  - She has a prescription for sublingual NTG.  2. Atrial fibrillation: Paroxysmal.  First noted in 2/24 with MI.  NSR today.  - Continue Eliquis. CBC today.  - Continue Coreg.  3. Chronic systolic CHF: Ischemic cardiomyopathy. Echo in [place], [place] at time of NSTEMI apparently showed low EF per family's report.  Echo in 3/24 with EF 30-35%, moderate infarct-related MR, normal RV.  Echo in 5/24 showed EF 35-40%, trivial MR, normal RV.  NYHA class I-II, not volume overloaded on exam, weight stable.  Doing well. - Decrease  Lasix to 20 mg daily. BMET/BNP today.  - Continue valsartan 20 mg bid.  - Continue Farxiga 10 mg daily.  - Continue spironolactone 25 mg daily.  - Increase Coreg to 6.25 mg bid.  - LBBB-like IVCD is not wide enough that she would be likely to show benefit with CRT.  - Repeat echo in 4/25 4. VF arrest: In setting of MI in [place] in 2/24. Given revascularization, ICD was not placed during her admission.  - EF > 35%, no indication for ICD (and also advanced age).   She will followup with me in 4/25 when she returns from [place]. Will get echo at that  time.   [name]  08/17/2023

## 2023-08-24 ENCOUNTER — Other Ambulatory Visit (HOSPITAL_COMMUNITY): Payer: Self-pay | Admitting: Cardiology

## 2023-09-21 ENCOUNTER — Other Ambulatory Visit (HOSPITAL_COMMUNITY): Payer: Self-pay | Admitting: Pharmacist

## 2023-09-21 DIAGNOSIS — I5022 Chronic systolic (congestive) heart failure: Secondary | ICD-10-CM

## 2023-09-21 DIAGNOSIS — Z79899 Other long term (current) drug therapy: Secondary | ICD-10-CM

## 2023-09-21 MED ORDER — SPIRONOLACTONE 25 MG PO TABS: 25.0000 mg | ORAL_TABLET | Freq: Every day | ORAL | 3 refills | Status: DC

## 2023-09-21 NOTE — Progress Notes (Signed)
Resending correct spironolactone prescription (with instructions to take 25 mg daily) to [facility]. Per patient, they have only been filling the 12.5 mg daily prescription, which is an old dose. Of note, this has been an issue in the past. Will ask [facility] to please stop filling old prescription.   Karle Plumber, PharmD, BCPS, BCCP, CPP Heart Failure Clinic Pharmacist [phone]

## 2023-10-13 DIAGNOSIS — Z23 Encounter for immunization: Secondary | ICD-10-CM | POA: Diagnosis not present

## 2023-10-19 DIAGNOSIS — I10 Essential (primary) hypertension: Secondary | ICD-10-CM | POA: Diagnosis not present

## 2023-10-19 DIAGNOSIS — I480 Paroxysmal atrial fibrillation: Secondary | ICD-10-CM | POA: Diagnosis not present

## 2023-10-19 DIAGNOSIS — D6869 Other thrombophilia: Secondary | ICD-10-CM | POA: Diagnosis not present

## 2023-10-19 DIAGNOSIS — I25118 Atherosclerotic heart disease of native coronary artery with other forms of angina pectoris: Secondary | ICD-10-CM | POA: Diagnosis not present

## 2023-10-20 ENCOUNTER — Other Ambulatory Visit (HOSPITAL_COMMUNITY): Payer: Self-pay | Admitting: Pharmacist

## 2023-10-20 MED ORDER — VALSARTAN 40 MG PO TABS: 20.0000 mg | ORAL_TABLET | Freq: Two times a day (BID) | ORAL | 3 refills | Status: DC

## 2023-10-23 ENCOUNTER — Other Ambulatory Visit (HOSPITAL_COMMUNITY): Payer: Self-pay | Admitting: Cardiology

## 2023-11-14 ENCOUNTER — Other Ambulatory Visit (HOSPITAL_COMMUNITY): Payer: Self-pay | Admitting: Family Medicine

## 2023-11-27 ENCOUNTER — Other Ambulatory Visit (HOSPITAL_COMMUNITY): Payer: Self-pay | Admitting: Cardiology

## 2023-12-04 ENCOUNTER — Other Ambulatory Visit (HOSPITAL_COMMUNITY): Payer: Self-pay | Admitting: Cardiology

## 2023-12-17 ENCOUNTER — Other Ambulatory Visit (HOSPITAL_COMMUNITY): Payer: Self-pay | Admitting: Cardiology

## 2024-01-14 ENCOUNTER — Other Ambulatory Visit (HOSPITAL_COMMUNITY): Payer: Self-pay | Admitting: Cardiology

## 2024-01-18 DIAGNOSIS — E785 Hyperlipidemia, unspecified: Secondary | ICD-10-CM | POA: Diagnosis not present

## 2024-01-18 DIAGNOSIS — D6869 Other thrombophilia: Secondary | ICD-10-CM | POA: Diagnosis not present

## 2024-01-18 DIAGNOSIS — I4901 Ventricular fibrillation: Secondary | ICD-10-CM | POA: Diagnosis not present

## 2024-01-18 DIAGNOSIS — I480 Paroxysmal atrial fibrillation: Secondary | ICD-10-CM | POA: Diagnosis not present

## 2024-01-18 DIAGNOSIS — I10 Essential (primary) hypertension: Secondary | ICD-10-CM | POA: Diagnosis not present

## 2024-01-18 DIAGNOSIS — I25118 Atherosclerotic heart disease of native coronary artery with other forms of angina pectoris: Secondary | ICD-10-CM | POA: Diagnosis not present

## 2024-01-22 ENCOUNTER — Encounter (HOSPITAL_COMMUNITY): Payer: Self-pay | Admitting: Cardiology

## 2024-01-22 ENCOUNTER — Ambulatory Visit (HOSPITAL_COMMUNITY)
Admission: RE | Admit: 2024-01-22 | Discharge: 2024-01-22 | Disposition: A | Discharge status: Home / Self Care | Source: Ambulatory Visit | Attending: Cardiology | Admitting: Cardiology

## 2024-01-22 ENCOUNTER — Other Ambulatory Visit (HOSPITAL_COMMUNITY): Payer: Self-pay

## 2024-01-22 ENCOUNTER — Other Ambulatory Visit: Payer: Self-pay

## 2024-01-22 VITALS — BP 122/64 | HR 53 | Ht 64.0 in | Wt 131.6 lb

## 2024-01-22 DIAGNOSIS — Z7984 Long term (current) use of oral hypoglycemic drugs: Secondary | ICD-10-CM | POA: Diagnosis not present

## 2024-01-22 DIAGNOSIS — Z853 Personal history of malignant neoplasm of breast: Secondary | ICD-10-CM | POA: Insufficient documentation

## 2024-01-22 DIAGNOSIS — Z7901 Long term (current) use of anticoagulants: Secondary | ICD-10-CM | POA: Insufficient documentation

## 2024-01-22 DIAGNOSIS — I5022 Chronic systolic (congestive) heart failure: Secondary | ICD-10-CM | POA: Diagnosis not present

## 2024-01-22 DIAGNOSIS — E785 Hyperlipidemia, unspecified: Secondary | ICD-10-CM | POA: Diagnosis not present

## 2024-01-22 DIAGNOSIS — Z8674 Personal history of sudden cardiac arrest: Secondary | ICD-10-CM | POA: Insufficient documentation

## 2024-01-22 DIAGNOSIS — I480 Paroxysmal atrial fibrillation: Secondary | ICD-10-CM | POA: Diagnosis not present

## 2024-01-22 DIAGNOSIS — I255 Ischemic cardiomyopathy: Secondary | ICD-10-CM | POA: Diagnosis not present

## 2024-01-22 DIAGNOSIS — I252 Old myocardial infarction: Secondary | ICD-10-CM | POA: Insufficient documentation

## 2024-01-22 DIAGNOSIS — Z79899 Other long term (current) drug therapy: Secondary | ICD-10-CM | POA: Insufficient documentation

## 2024-01-22 DIAGNOSIS — I081 Rheumatic disorders of both mitral and tricuspid valves: Secondary | ICD-10-CM | POA: Insufficient documentation

## 2024-01-22 DIAGNOSIS — I447 Left bundle-branch block, unspecified: Secondary | ICD-10-CM | POA: Insufficient documentation

## 2024-01-22 DIAGNOSIS — I110 Hypertensive heart disease with heart failure: Secondary | ICD-10-CM | POA: Insufficient documentation

## 2024-01-22 DIAGNOSIS — Z86718 Personal history of other venous thrombosis and embolism: Secondary | ICD-10-CM | POA: Diagnosis not present

## 2024-01-22 DIAGNOSIS — I2510 Atherosclerotic heart disease of native coronary artery without angina pectoris: Secondary | ICD-10-CM | POA: Insufficient documentation

## 2024-01-22 LAB — BRAIN NATRIURETIC PEPTIDE: B Natriuretic Peptide: 504.2 pg/mL — ABNORMAL HIGH (ref 0.0–100.0)

## 2024-01-22 LAB — BASIC METABOLIC PANEL WITH GFR
Anion gap: 8 (ref 5–15)
BUN: 26 mg/dL — ABNORMAL HIGH (ref 8–23)
CO2: 24 mmol/L (ref 22–32)
Calcium: 10.5 mg/dL — ABNORMAL HIGH (ref 8.9–10.3)
Chloride: 107 mmol/L (ref 98–111)
Creatinine, Ser: 1.13 mg/dL — ABNORMAL HIGH (ref 0.44–1.00)
GFR, Estimated: 45 mL/min — ABNORMAL LOW (ref >60)
Glucose, Bld: 93 mg/dL (ref 70–99)
Potassium: 4.1 mmol/L (ref 3.5–5.1)
Sodium: 139 mmol/L (ref 135–145)

## 2024-01-22 LAB — ECHOCARDIOGRAM COMPLETE
AR max vel: 2.23 cm2
AV Area VTI: 2.04 cm2
AV Area mean vel: 2.4 cm2
AV Mean grad: 4 mmHg
AV Peak grad: 7.4 mmHg
Ao pk vel: 1.36 m/s
Area-P 1/2: 2.17 cm2
Calc EF: 51.9 %
MV M vel: 3.68 m/s
MV Peak grad: 54.2 mmHg
S' Lateral: 3.1 cm
Single Plane A2C EF: 57.8 %
Single Plane A4C EF: 45.8 %

## 2024-01-22 MED ORDER — ENTRESTO 24-26 MG PO TABS: 1.0000 | ORAL_TABLET | Freq: Two times a day (BID) | ORAL | 11 refills | Status: DC

## 2024-01-22 NOTE — Patient Instructions (Addendum)
 STOP Valsartan    START Entresto 24/26 mg Twice daily  Labs done today, your results will be available in MyChart, we will contact you for abnormal readings.  Repeat blood work in 10 days.  Your physician recommends that you schedule a follow-up appointment in: 6 weeks.  If you have any questions or concerns before your next appointment please send us  a message through [place] or call our office at [phone].    TO LEAVE A MESSAGE FOR THE NURSE SELECT OPTION 2, PLEASE LEAVE A MESSAGE INCLUDING: YOUR NAME DATE OF BIRTH CALL BACK NUMBER REASON FOR CALL**this is important as we prioritize the call backs  YOU WILL RECEIVE A CALL BACK THE SAME DAY AS LONG AS YOU CALL BEFORE 4:00 PM  At the [facility], you and your health needs are our priority. As part of our continuing mission to provide you with exceptional heart care, we have created designated Provider Care Teams. These Care Teams include your primary Cardiologist (physician) and Advanced Practice Providers (APPs- Physician Assistants and Nurse Practitioners) who all work together to provide you with the care you need, when you need it.   You may see any of the following providers on your designated Care Team at your next follow up: Dr Jules Oar Dr Peder Bourdon Dr. Alwin Baars Dr. Arta Lark Amy Marijane Shoulders, NP Ruddy Corral, [place] [facility] [place], [place] Dennise Fitz, NP [place] Lee, NP Shawnee Dellen, NP Luster Salters, PharmD Bevely Brush, PharmD   Please be sure to bring in all your medications bottles to every appointment.    Thank you for choosing [facility]

## 2024-01-24 NOTE — Progress Notes (Signed)
 PCP: Suan Elm, MD Cardiology: Dr. Mitzie Anda  Chief complaint: CHF  89 y.o. with history of breast cancer, chronic LBBB, and HTN was admitted to a hospital in [place], [place]  with NSTEMI and VF arrest in 2/24.  She was referred by Dr. Delorise Few for evaluation of CHF, atrial fibrillation, and acute systolic CHF.  Prior to MI in 2/24, patient was quite healthy.  She played tennis and pickleball. She had no exertional dyspnea or chest pain.  She had a DVT in 2019 after a long car trip.  She had also, of note, received Adriamycin years ago with treatment of breast cancer.   In 2/24, she developed severe "heartburn" one evening.  Meds for heartburn did not help.  Pain eventually eased off and she was able to sleep.  However, chest pain returned again the next day and she went to the hospital.  While checking into the ER, she had a VF arrest requiring defibrillation and CPR. She broke several ribs. She was taken for cath, this showed severe stenosis in LCx that was treated with DES.  She developed atrial fibrillation while in the hospital and Eliquis  was started.  She also was noted to have CHF and received IV Lasix . She was discharged with a Lifevest. At my initial visit with her, she was short of breath and volume overloaded, Lasix  was increased.  Echo in 3/24 showed EF 30-35%, moderate infarct-related MR, normal RV.   Echo 5/24 showed  EF 35-40% with basal to mid inferolateral and anterolateral akinesis, RV normal, PASP 31 mmHg, trivial MR. LifeVest removed.  Echo was done today and reviewed, EF 40-45%, normal RV.   She returns today for followup of CHF.  Doing well generally.  No lightheadedness.  She is short of breath after walking for about 15 minutes.  She golfs and walks in the pool for exercise.  No chest pain.  No orthopnea/PND. No BRBPR/melena. Weight up 2 lbs.   ECG (personally reviewed): NSR, 1st degree AVB, IVCD 128 msec  Labs (2/24): LDL 69, HDL 36 Labs (5/24): K 3.9, creatinine 1.16,  BNP 331, hgb 13.1 Labs (5/24): K 3.5, creatinine 1.26, LDL 50 Labs (11/24): LDL 48, BNP 463, K 4.2, creatinine 1.12  PMH: 1. LBBB: Chronic 2. HTN 3. Breast cancer: Treated in 2008 with surgery and radiation.  She had Adriamycin.  4. Hyperlipidemia 5. DVT: 2019, in setting of long car ride.  6. Atrial fibrillation: Paroxysmal.  First noted during admission for MI in 2024.  7. CAD: NSTEMI 2/24, treated in [place], [place].  Cath showed severe stenosis in LCx, she had DES placed.  8. Chronic systolic CHF: Echo in 9/19 with EF 50-55%.  Ischemic cardiomyopathy.  Echo from 2/24 in [place] per family's report showed a weak heart.  - Echo (3/24): EF 30-35%, moderate infarct-related MR, normal RV.  - Echo (5/24): EF 35-40% with basal to mid inferolateral and anterolateral akinesis, RV normal, PASP 31 mmHg, trivial MR.  - Echo (4/25): EF 40-45%, normal RV.  9. VF arrest: In 2/24 in the setting of MI.    SH: Widow, lives in [place].  2 children.  No smoking.  No ETOH.   FH: Son with CABG.   ROS: All systems reviewed and negative except as per HPI.   Current Outpatient Medications  Medication Sig Dispense Refill   atorvastatin  (LIPITOR) 40 MG tablet TAKE 1 TABLET BY MOUTH EVERY DAY 90 tablet 3   calcium -vitamin D  (OSCAL WITH D) 500-5 MG-MCG tablet Take 1 tablet  by mouth.     carvedilol  (COREG ) 6.25 MG tablet Take 1 tablet (6.25 mg total) by mouth 2 (two) times daily with a meal. 180 tablet 3   dapagliflozin  propanediol (FARXIGA ) 10 MG TABS tablet TAKE 1 TABLET BY MOUTH DAILY BEFORE BREAKFAST. 90 tablet 2   diazepam (VALIUM) 5 MG tablet As needed     ELIQUIS  5 MG TABS tablet TAKE 1 TABLET BY MOUTH TWICE A DAY 60 tablet 11   furosemide  (LASIX ) 20 MG tablet Take 1 tablet (20 mg total) by mouth daily. 90 tablet 3   Multiple Vitamins-Minerals (EYE VITAMINS PO) Take 1 tablet by mouth daily.      nitroGLYCERIN  (NITROSTAT ) 0.4 MG SL tablet Place 1 tablet (0.4 mg total) under the tongue every 5 (five)  minutes as needed for chest pain. 90 tablet 3   sacubitril-valsartan  (ENTRESTO) 24-26 MG Take 1 tablet by mouth 2 (two) times daily. 60 tablet 11   spironolactone  (ALDACTONE ) 25 MG tablet Take 1 tablet (25 mg total) by mouth daily. 90 tablet 3   No current facility-administered medications for this encounter.   Wt Readings from Last 3 Encounters:  01/22/24 59.7 kg (131 lb 9.6 oz)  08/17/23 58.5 kg (129 lb)  05/15/23 58.8 kg (129 lb 10.1 oz)   BP 122/64   Pulse (!) 53   Ht 5\' 4"  (1.626 m)   Wt 59.7 kg (131 lb 9.6 oz)   SpO2 97%   BMI 22.59 kg/m  General: NAD Neck: No JVD, no thyromegaly or thyroid  nodule.  Lungs: Clear to auscultation bilaterally with normal respiratory effort. CV: Nondisplaced PMI.  Heart regular S1/S2, no S3/S4, no murmur.  No peripheral edema.  No carotid bruit.  Normal pedal pulses.  Abdomen: Soft, nontender, no hepatosplenomegaly, no distention.  Skin: Intact without lesions or rashes.  Neurologic: Alert and oriented x 3.  Psych: Normal affect. Extremities: No clubbing or cyanosis.  HEENT: Normal.   Assessment/Plan: 1. CAD: NSTEMI in 2/24 with DES to LCx.  Complicated by VF arrest, acute systolic CHF, and atrial fibrillation.  No further chest pain.  ECG with LBBB-like IVCD.  - She is now off Plavix , > 1 year post-PCI and she is also on Eliquis .   - Continue atorvastatin  40 mg daily, good lipids 11/24.  - She has a prescription for sublingual NTG.  2. Atrial fibrillation: Paroxysmal.  First noted in 2/24 with MI.  NSR today.  - Continue Eliquis .  - Continue Coreg .  3. Chronic systolic CHF: Ischemic cardiomyopathy. Echo in [place], [place] at time of NSTEMI apparently showed low EF per family's report.  Echo in 3/24 with EF 30-35%, moderate infarct-related MR, normal RV.  Echo in 5/24 showed EF 35-40%, trivial MR, normal RV.  Echo today showed EF 40-45%, normal RV.  NYHA class II, not volume overloaded on exam, weight stable. . - Continue Lasix  20 mg daily.  BMET/BNP today.  - Stop valsartan , start Entresto 24/26 bid with BMET in 10 days.  - Continue Farxiga  10 mg daily.  - Continue spironolactone  25 mg daily.  - Continue Coreg  6.25 mg bid.  - LBBB-like IVCD is not wide enough that she would be likely to show benefit with CRT.  EF is also now out of ICD range.  4. VF arrest: In setting of MI in [place]  in 2/24. Given revascularization, ICD was not placed during her admission.  - EF > 35%, no indication for ICD (and also advanced age).   Followup with APP in 1  month  I spent 32 minutes reviewing records, interviewing/examining patient, and managing orders.   Peder Bourdon  01/24/2024

## 2024-02-01 ENCOUNTER — Telehealth (HOSPITAL_COMMUNITY): Payer: Self-pay | Admitting: Cardiology

## 2024-02-01 MED ORDER — VALSARTAN 40 MG PO TABS: 20.0000 mg | ORAL_TABLET | Freq: Two times a day (BID) | ORAL | 6 refills | Status: DC

## 2024-02-01 NOTE — Telephone Encounter (Signed)
 Pt called to report dizziness since starting entresto , was told when med was prescribed to call if she noticed a change  No b/p readings/vitals  Reports dizziness is not causing her a change her daily routine however she does have to lay down once dizziness starts. -should she continue or change back to valsartan   Pt also reports body/joint aches and into shoulders with STATIN     Please advise

## 2024-02-01 NOTE — Telephone Encounter (Signed)
 Pt aware to change entresto  to valsartan   Reports pains have been since she started medication a year ago Would like to hold statin to see if pains get better Advised to call back with update

## 2024-02-02 ENCOUNTER — Ambulatory Visit (HOSPITAL_COMMUNITY)
Admission: RE | Admit: 2024-02-02 | Discharge: 2024-02-02 | Disposition: A | Discharge status: Home / Self Care | Source: Ambulatory Visit | Attending: Internal Medicine | Admitting: Internal Medicine

## 2024-02-02 DIAGNOSIS — I5022 Chronic systolic (congestive) heart failure: Secondary | ICD-10-CM | POA: Insufficient documentation

## 2024-02-02 LAB — BASIC METABOLIC PANEL WITH GFR
Anion gap: 9 (ref 5–15)
BUN: 38 mg/dL — ABNORMAL HIGH (ref 8–23)
CO2: 24 mmol/L (ref 22–32)
Calcium: 10.6 mg/dL — ABNORMAL HIGH (ref 8.9–10.3)
Chloride: 106 mmol/L (ref 98–111)
Creatinine, Ser: 1.28 mg/dL — ABNORMAL HIGH (ref 0.44–1.00)
GFR, Estimated: 39 mL/min — ABNORMAL LOW (ref >60)
Glucose, Bld: 130 mg/dL — ABNORMAL HIGH (ref 70–99)
Potassium: 4.3 mmol/L (ref 3.5–5.1)
Sodium: 139 mmol/L (ref 135–145)

## 2024-02-03 ENCOUNTER — Telehealth (HOSPITAL_COMMUNITY): Payer: Self-pay

## 2024-02-03 DIAGNOSIS — I5022 Chronic systolic (congestive) heart failure: Secondary | ICD-10-CM

## 2024-02-03 MED ORDER — FUROSEMIDE 20 MG PO TABS: 20.0000 mg | ORAL_TABLET | ORAL | Status: DC

## 2024-02-03 NOTE — Telephone Encounter (Addendum)
 Spoke to patient aware of medication changes. Lab work scheduled  ----- Message from [employer] sent at 02/02/2024  9:06 PM EDT ----- Hold Lasix  for a day then decrease to 20 mg every other day.  BMET 10 days.

## 2024-02-15 ENCOUNTER — Ambulatory Visit (HOSPITAL_COMMUNITY): Payer: Self-pay | Admitting: Cardiology

## 2024-02-15 ENCOUNTER — Ambulatory Visit (HOSPITAL_COMMUNITY)
Admission: RE | Admit: 2024-02-15 | Discharge: 2024-02-15 | Disposition: A | Discharge status: Home / Self Care | Source: Ambulatory Visit | Attending: Cardiology | Admitting: Cardiology

## 2024-02-15 DIAGNOSIS — I5022 Chronic systolic (congestive) heart failure: Secondary | ICD-10-CM | POA: Diagnosis not present

## 2024-02-15 LAB — BASIC METABOLIC PANEL WITH GFR
Anion gap: 8 (ref 5–15)
BUN: 25 mg/dL — ABNORMAL HIGH (ref 8–23)
CO2: 21 mmol/L — ABNORMAL LOW (ref 22–32)
Calcium: 10.6 mg/dL — ABNORMAL HIGH (ref 8.9–10.3)
Chloride: 107 mmol/L (ref 98–111)
Creatinine, Ser: 1.07 mg/dL — ABNORMAL HIGH (ref 0.44–1.00)
GFR, Estimated: 48 mL/min — ABNORMAL LOW (ref >60)
Glucose, Bld: 106 mg/dL — ABNORMAL HIGH (ref 70–99)
Potassium: 4.5 mmol/L (ref 3.5–5.1)
Sodium: 136 mmol/L (ref 135–145)

## 2024-02-16 ENCOUNTER — Telehealth (HOSPITAL_COMMUNITY): Payer: Self-pay | Admitting: Cardiology

## 2024-02-16 DIAGNOSIS — E785 Hyperlipidemia, unspecified: Secondary | ICD-10-CM

## 2024-02-16 NOTE — Telephone Encounter (Signed)
 Patient called to report she feels a lot better while holding Lipitor. Report joint pain and muscles aches have gone away completely  Patient would like to know what should be done now, restart med? Decrease dose? Change med?  Lipid Panel     Component Value Date/Time   CHOL 108 08/17/2023 1231   TRIG 74 08/17/2023 1231   HDL 45 08/17/2023 1231   CHOLHDL 2.4 08/17/2023 1231   VLDL 15 08/17/2023 1231   LDLCALC 48 08/17/2023 1231    Please advise

## 2024-02-16 NOTE — Telephone Encounter (Signed)
 Pt aware and voiced understanding

## 2024-02-16 NOTE — Telephone Encounter (Signed)
 Refer to lipid clinic to get her on Repatha, let her know that this is a non-statin cholesterol-lowering agent that will decrease risk of MI and has minimal symptoms.

## 2024-02-18 ENCOUNTER — Ambulatory Visit: Attending: Cardiology | Admitting: Pharmacist

## 2024-02-18 DIAGNOSIS — I2510 Atherosclerotic heart disease of native coronary artery without angina pectoris: Secondary | ICD-10-CM | POA: Diagnosis not present

## 2024-02-18 DIAGNOSIS — I469 Cardiac arrest, cause unspecified: Secondary | ICD-10-CM | POA: Insufficient documentation

## 2024-02-18 DIAGNOSIS — I2089 Other forms of angina pectoris: Secondary | ICD-10-CM | POA: Insufficient documentation

## 2024-02-18 DIAGNOSIS — D6869 Other thrombophilia: Secondary | ICD-10-CM | POA: Insufficient documentation

## 2024-02-18 DIAGNOSIS — E785 Hyperlipidemia, unspecified: Secondary | ICD-10-CM | POA: Insufficient documentation

## 2024-02-18 DIAGNOSIS — I4901 Ventricular fibrillation: Secondary | ICD-10-CM | POA: Insufficient documentation

## 2024-02-18 DIAGNOSIS — I480 Paroxysmal atrial fibrillation: Secondary | ICD-10-CM | POA: Insufficient documentation

## 2024-02-18 DIAGNOSIS — I252 Old myocardial infarction: Secondary | ICD-10-CM | POA: Insufficient documentation

## 2024-02-18 DIAGNOSIS — I25118 Atherosclerotic heart disease of native coronary artery with other forms of angina pectoris: Secondary | ICD-10-CM | POA: Insufficient documentation

## 2024-02-18 DIAGNOSIS — R42 Dizziness and giddiness: Secondary | ICD-10-CM | POA: Insufficient documentation

## 2024-02-18 DIAGNOSIS — R531 Weakness: Secondary | ICD-10-CM | POA: Insufficient documentation

## 2024-02-18 NOTE — Progress Notes (Unsigned)
 Patient ID: Marissa Carroll                 DOB: June 19, 1928                    MRN: [id number]     HPI: Marissa Carroll is a 89 y.o. female patient referred to lipid clinic by Dr Mitzie Anda. PMH is significant for CHF, CAD, HLD, and a history of breast cancer. Patient is statin intolerant,  Patient presents today to discuss lipid management. Called ADV HF clinic and reported joint and muscle pain on atorvastatin . Held doses and felt much better.  Has a family history of CAD. Son with CABG.  Last fasting lipid panel from November 2024 showed well controlled LDL. Was on atorvastatin  at the time.  Had NSTEMI in 2/24 and DES placed in LCx. Denies tobacco and EToH.  Current Medications: N/A  Intolerances:  Atorvastatin   Risk Factors:  CAD Family history Hx of MI  LDL goal: <55  Past Medical History:  Diagnosis Date   CHF (congestive heart failure) (HCC)    Coronary artery disease    History of breast cancer    Hyperlipidemia    Hypertension     Current Outpatient Medications on File Prior to Visit  Medication Sig Dispense Refill   atorvastatin  (LIPITOR) 40 MG tablet TAKE 1 TABLET BY MOUTH EVERY DAY 90 tablet 3   calcium -vitamin D  (OSCAL WITH D) 500-5 MG-MCG tablet Take 1 tablet by mouth.     carvedilol  (COREG ) 6.25 MG tablet Take 1 tablet (6.25 mg total) by mouth 2 (two) times daily with a meal. 180 tablet 3   dapagliflozin  propanediol (FARXIGA ) 10 MG TABS tablet TAKE 1 TABLET BY MOUTH DAILY BEFORE BREAKFAST. 90 tablet 2   diazepam (VALIUM) 5 MG tablet As needed     ELIQUIS  5 MG TABS tablet TAKE 1 TABLET BY MOUTH TWICE A DAY 60 tablet 11   furosemide  (LASIX ) 20 MG tablet Take 1 tablet (20 mg total) by mouth every other day.     Multiple Vitamins-Minerals (EYE VITAMINS PO) Take 1 tablet by mouth daily.      nitroGLYCERIN  (NITROSTAT ) 0.4 MG SL tablet Place 1 tablet (0.4 mg total) under the tongue every 5 (five) minutes as needed for chest pain. 90 tablet 3   spironolactone   (ALDACTONE ) 25 MG tablet Take 1 tablet (25 mg total) by mouth daily. 90 tablet 3   valsartan  (DIOVAN ) 40 MG tablet Take 0.5 tablets (20 mg total) by mouth 2 (two) times daily. 30 tablet 6   No current facility-administered medications on file prior to visit.    No Known Allergies  Assessment/Plan:  1. Hyperlipidemia - Patient last LDL was well controlled but this was while she was on atorvastatin . Will place order to update fasting lipid panel. Anticipate needing PCSK9i or inclisiran.  Discussed mechanism of action and dosing frequency of PCSK9i vs inclisiran. Patient expressed interest in PCSK9i. Using demo pen, educated patient on storage, site selection, administration, and possible adverse effects. Will complete PA and contact patient with result. Recheck lipid panel in 2-3 months.  Start Repatha/Praluent q 2 weeks Recheck lipid panel in 2-3 months  Chris Carlyle Achenbach, PharmD, BCACP, CDCES, CPP [address], [address] [place], [place], [address] Phone: [phone], Fax: [phone]

## 2024-02-18 NOTE — Patient Instructions (Signed)
 It was nice meeting you today  We would like your LDL (bad cholesterol) to be less than 55  Please have your fasting lipid panel drawn tomorrow. I have already placed the orders  Once we receive your results, I will complete a prior authorization for Repatha  I will call when I receive your results  Once you start the Repatha, we will recheck your lipid panel again 3 months later  Chris Socorro Kanitz, PharmD, BCACP, CDCES, CPP [address] [place], [place], [address] Phone: [phone], Fax: [phone]

## 2024-02-19 DIAGNOSIS — E785 Hyperlipidemia, unspecified: Secondary | ICD-10-CM | POA: Diagnosis not present

## 2024-02-19 DIAGNOSIS — I2510 Atherosclerotic heart disease of native coronary artery without angina pectoris: Secondary | ICD-10-CM | POA: Diagnosis not present

## 2024-02-20 LAB — LIPID PANEL
Chol/HDL Ratio: 3.4 ratio (ref 0.0–4.4)
Cholesterol, Total: 151 mg/dL (ref 100–199)
HDL: 45 mg/dL (ref >39)
LDL Chol Calc (NIH): 88 mg/dL (ref 0–99)
Triglycerides: 97 mg/dL (ref 0–149)
VLDL Cholesterol Cal: 18 mg/dL (ref 5–40)

## 2024-02-21 ENCOUNTER — Ambulatory Visit (HOSPITAL_COMMUNITY): Payer: Self-pay | Admitting: Cardiology

## 2024-02-22 ENCOUNTER — Telehealth: Payer: Self-pay | Admitting: Pharmacist

## 2024-02-22 ENCOUNTER — Encounter: Payer: Self-pay | Admitting: Pharmacist

## 2024-02-22 NOTE — Telephone Encounter (Signed)
 Please complete PA for Repatha/Praluent

## 2024-02-23 ENCOUNTER — Telehealth: Payer: Self-pay | Admitting: Pharmacy Technician

## 2024-02-23 ENCOUNTER — Other Ambulatory Visit (HOSPITAL_COMMUNITY): Payer: Self-pay

## 2024-02-23 NOTE — Telephone Encounter (Signed)
 Pharmacy Patient Advocate Encounter   Received notification from Pt Calls Messages that prior authorization for Repatha is required/requested.   Insurance verification completed.   The patient is insured through [employer] .   Per test claim: PA required; PA submitted to above mentioned insurance via CoverMyMeds Key/confirmation #/EOC [id number] Status is pending

## 2024-02-23 NOTE — Telephone Encounter (Signed)
 Pharmacy Patient Advocate Encounter  Received notification from [employer] that Prior Authorization for Repatha has been APPROVED from 09/30/23 to 02/22/25. Ran test claim, Copay is $109.62- one month. This test claim was processed through [facility]- copay amounts may vary at other pharmacies due to pharmacy/plan contracts, or as the patient moves through the different stages of their insurance plan.   PA #/Case ID/Reference #: [id number]

## 2024-02-24 DIAGNOSIS — Z1231 Encounter for screening mammogram for malignant neoplasm of breast: Secondary | ICD-10-CM | POA: Diagnosis not present

## 2024-03-07 ENCOUNTER — Telehealth (HOSPITAL_COMMUNITY): Payer: Self-pay

## 2024-03-07 NOTE — Telephone Encounter (Signed)
 Called to confirm/remind patient of their appointment at the [facility] on 03/08/24.   Appointment:   [] Confirmed  [x] Left mess   [] No answer/No voice mail  [] VM Full/unable to leave message  [] Phone not in service  And to bring in all medications and/or complete list.

## 2024-03-08 ENCOUNTER — Telehealth (HOSPITAL_COMMUNITY): Payer: Self-pay | Admitting: Pharmacist

## 2024-03-08 ENCOUNTER — Ambulatory Visit (HOSPITAL_COMMUNITY)
Admission: RE | Admit: 2024-03-08 | Discharge: 2024-03-08 | Disposition: A | Discharge status: Home / Self Care | Source: Ambulatory Visit | Attending: Adult Health | Admitting: Adult Health

## 2024-03-08 VITALS — BP 110/66 | HR 57 | Wt 130.0 lb

## 2024-03-08 DIAGNOSIS — Z8674 Personal history of sudden cardiac arrest: Secondary | ICD-10-CM | POA: Diagnosis not present

## 2024-03-08 DIAGNOSIS — I255 Ischemic cardiomyopathy: Secondary | ICD-10-CM | POA: Insufficient documentation

## 2024-03-08 DIAGNOSIS — I480 Paroxysmal atrial fibrillation: Secondary | ICD-10-CM | POA: Insufficient documentation

## 2024-03-08 DIAGNOSIS — I2510 Atherosclerotic heart disease of native coronary artery without angina pectoris: Secondary | ICD-10-CM | POA: Diagnosis not present

## 2024-03-08 DIAGNOSIS — Z955 Presence of coronary angioplasty implant and graft: Secondary | ICD-10-CM | POA: Insufficient documentation

## 2024-03-08 DIAGNOSIS — Z7901 Long term (current) use of anticoagulants: Secondary | ICD-10-CM | POA: Insufficient documentation

## 2024-03-08 DIAGNOSIS — I447 Left bundle-branch block, unspecified: Secondary | ICD-10-CM | POA: Diagnosis not present

## 2024-03-08 DIAGNOSIS — I110 Hypertensive heart disease with heart failure: Secondary | ICD-10-CM | POA: Insufficient documentation

## 2024-03-08 DIAGNOSIS — Z853 Personal history of malignant neoplasm of breast: Secondary | ICD-10-CM | POA: Diagnosis not present

## 2024-03-08 DIAGNOSIS — I252 Old myocardial infarction: Secondary | ICD-10-CM | POA: Insufficient documentation

## 2024-03-08 DIAGNOSIS — I5022 Chronic systolic (congestive) heart failure: Secondary | ICD-10-CM | POA: Insufficient documentation

## 2024-03-08 MED ORDER — FUROSEMIDE 20 MG PO TABS: 20.0000 mg | ORAL_TABLET | ORAL | 3 refills | Status: DC

## 2024-03-08 NOTE — Patient Instructions (Addendum)
 Good to see you today!  Start taking lasix  20 mg on Mondays and Fridays starting next week  Your physician recommends that you schedule a follow-up appointment 3 months(September) Call office in July to schedule an appointment  If you have any questions or concerns before your next appointment please send us  a message through [place] or call our office at [phone].    TO LEAVE A MESSAGE FOR THE NURSE SELECT OPTION 2, PLEASE LEAVE A MESSAGE INCLUDING: YOUR NAME DATE OF BIRTH CALL BACK NUMBER REASON FOR CALL**this is important as we prioritize the call backs  YOU WILL RECEIVE A CALL BACK THE SAME DAY AS LONG AS YOU CALL BEFORE 4:00 PM At the [facility], you and your health needs are our priority. As part of our continuing mission to provide you with exceptional heart care, we have created designated Provider Care Teams. These Care Teams include your primary Cardiologist (physician) and Advanced Practice Providers (APPs- Physician Assistants and Nurse Practitioners) who all work together to provide you with the care you need, when you need it.   You may see any of the following providers on your designated Care Team at your next follow up: Dr Jules Oar Dr Peder Bourdon Dr. Alwin Baars Dr. Arta Lark Amy Marijane Shoulders, NP Ruddy Corral, [place] [facility] [place], [place] Dennise Fitz, NP [place] Lee, NP Shawnee Dellen, NP Luster Salters, PharmD Bevely Brush, PharmD   Please be sure to bring in all your medications bottles to every appointment.    Thank you for choosing [facility]

## 2024-03-08 NOTE — Progress Notes (Signed)
 PCP: Suan Elm, MD Cardiology: Dr. Mitzie Anda  Chief complaint: CHF  89 y.o. with history of breast cancer, chronic LBBB, and HTN was admitted to a hospital in [place], [place]  with NSTEMI and VF arrest in 2/24.  She was referred by Dr. Delorise Few for evaluation of CHF, atrial fibrillation, and acute systolic CHF.  Prior to MI in 2/24, patient was quite healthy.  She played tennis and pickleball. She had no exertional dyspnea or chest pain.  She had a DVT in 2019 after a long car trip.  She had also, of note, received Adriamycin years ago with treatment of breast cancer.   In 2/24, she developed severe "heartburn" one evening.  Meds for heartburn did not help.  Pain eventually eased off and she was able to sleep.  However, chest pain returned again the next day and she went to the hospital.  While checking into the ER, she had a VF arrest requiring defibrillation and CPR. She broke several ribs. She was taken for cath, this showed severe stenosis in LCx that was treated with DES.  She developed atrial fibrillation while in the hospital and Eliquis  was started.  She also was noted to have CHF and received IV Lasix . She was discharged with a Lifevest. At my initial visit with her, she was short of breath and volume overloaded, Lasix  was increased.  Echo in 3/24 showed EF 30-35%, moderate infarct-related MR, normal RV.   Echo 5/24 showed  EF 35-40% with basal to mid inferolateral and anterolateral akinesis, RV normal, PASP 31 mmHg, trivial MR. LifeVest removed.  Echo 12/2023 EF 40-45%, normal RV.   Today she returns for HF follow up. Overall feeling fine. Very active. Gets a little short of breath with inclines. Denies PND/Orthopnea. No chest pain. Appetite ok. No fever or chills. Weight at home  stable at 129 pounds. Taking all medications.   PMH: 1. LBBB: Chronic 2. HTN 3. Breast cancer: Treated in 2008 with surgery and radiation.  She had Adriamycin.  4. Hyperlipidemia 5. DVT: 2019, in setting of  long car ride.  6. Atrial fibrillation: Paroxysmal.  First noted during admission for MI in 2024.  7. CAD: NSTEMI 2/24, treated in [place], [place].  Cath showed severe stenosis in LCx, she had DES placed.  8. Chronic systolic CHF: Echo in 9/19 with EF 50-55%.  Ischemic cardiomyopathy.  Echo from 2/24 in [place] per family's report showed a weak heart.  - Echo (3/24): EF 30-35%, moderate infarct-related MR, normal RV.  - Echo (5/24): EF 35-40% with basal to mid inferolateral and anterolateral akinesis, RV normal, PASP 31 mmHg, trivial MR.  - Echo (4/25): EF 40-45%, normal RV.  9. VF arrest: In 2/24 in the setting of MI.    SH: Widow, lives in [place].  2 children.  No smoking.  No ETOH.   FH: Son with CABG.   ROS: All systems reviewed and negative except as per HPI.   Current Outpatient Medications  Medication Sig Dispense Refill   calcium -vitamin D  (OSCAL WITH D) 500-5 MG-MCG tablet Take 1 tablet by mouth daily.     carvedilol  (COREG ) 6.25 MG tablet Take 1 tablet (6.25 mg total) by mouth 2 (two) times daily with a meal. 180 tablet 3   dapagliflozin  propanediol (FARXIGA ) 10 MG TABS tablet TAKE 1 TABLET BY MOUTH DAILY BEFORE BREAKFAST. 90 tablet 2   diazepam (VALIUM) 5 MG tablet As needed     ELIQUIS  5 MG TABS tablet TAKE 1 TABLET BY MOUTH TWICE  A DAY 60 tablet 11   furosemide  (LASIX ) 20 MG tablet Take 1 tablet (20 mg total) by mouth every other day.     Multiple Vitamins-Minerals (EYE VITAMINS PO) Take 1 tablet by mouth 2 (two) times daily.     nitroGLYCERIN  (NITROSTAT ) 0.4 MG SL tablet Place 1 tablet (0.4 mg total) under the tongue every 5 (five) minutes as needed for chest pain. 90 tablet 3   spironolactone  (ALDACTONE ) 25 MG tablet Take 1 tablet (25 mg total) by mouth daily. 90 tablet 3   valsartan  (DIOVAN ) 40 MG tablet Take 20 mg by mouth 2 (two) times daily.     No current facility-administered medications for this encounter.   Wt Readings from Last 3 Encounters:  03/08/24 59 kg  (130 lb)  01/22/24 59.7 kg (131 lb 9.6 oz)  08/17/23 58.5 kg (129 lb)   BP 110/66   Pulse (!) 57   Wt 59 kg (130 lb)   SpO2 97%   BMI 22.31 kg/m  General:   No resp difficulty Neck: supple. no JVD.  Cor: PMI nondisplaced. Regular rate & rhythm. No rubs, gallops or murmurs. Lungs: clear Abdomen: soft, nontender, nondistended.  Extremities: no cyanosis, clubbing, rash, edema Neuro: alert & oriented x3  Assessment/Plan: 1. CAD: NSTEMI in 2/24 with DES to LCx.  Complicated by VF arrest, acute systolic CHF, and atrial fibrillation.  No further chest pain.  ECG with LBBB-like IVCD.  - She is now off Plavix , > 1 year post-PCI  - Continue eliquis  5 mg twice  a day.     - Off lipitor dur to myalgia.  - Repatha has been approved but she wants to try Leqvio. I asked Pharmacy to discuss with her. She has been referred back to the Lipid Clinic.   - She has a prescription for sublingual NTG.  2. Atrial fibrillation: Paroxysmal.  First noted in 2/24 with MI.  Regular on exam.  - Continue Eliquis .  - Continue Coreg .  3. Chronic systolic CHF: Ischemic cardiomyopathy. Echo in [place], [place] at time of NSTEMI apparently showed low EF per family's report.  Echo in 3/24 with EF 30-35%, moderate infarct-related MR, normal RV.  Echo in 5/24 showed EF 35-40%, trivial MR, normal RV.  Echo today showed EF 40-45%, normal RV.   NYHA II. Appears euvolemic. Cut back lasix  to Monday and Friday. Take extra lasix  as needed.  -Continue  valsartan  20 mg twice a day. She was unable to start entresto .  - Continue Farxiga  10 mg daily.  - Continue spironolactone  25 mg daily.  - Continue Coreg  6.25 mg bid.  - LBBB-like IVCD is not wide enough that she would be likely to show benefit with CRT.  EF is also now out of ICD range.  4. VF arrest: In setting of MI in [place]  in 2/24. Given revascularization, ICD was not placed during her admission.  - EF > 35%, no indication for ICD (and also advanced age).   Follow up in 3  months with Dr Mitzie Anda.   Kerby Hockley NP-C  03/08/2024

## 2024-03-08 NOTE — Telephone Encounter (Signed)
 Marissa Carroll,  Patient was seen in AHF Clinic today and let Amy know that she feels the inclisiran would be a better fit for her than the Repatha. She is very interested in the fact that it is every 6 months and that she would not have to be administering the injection herself. Since you saw her in lipid clinic on 02/18/24 and discussed the options, can you and your team work on PA for the inclisiran and get her scheduled? She is starting to worry that she has been off of her statin for a month now.  Thank you,  Barbra Ley

## 2024-03-17 ENCOUNTER — Telehealth: Payer: Self-pay

## 2024-03-17 ENCOUNTER — Other Ambulatory Visit: Payer: Self-pay | Admitting: Pharmacist

## 2024-03-17 NOTE — Telephone Encounter (Signed)
 Auth Submission: NO AUTH NEEDED Site of care: Site of care: [facility] Payer: medicare a/b, [employer] [place] supp Medication & CPT/J Code(s) submitted: Leqvio (Inclisiran) J1306 Diagnosis Code: e78.5 Route of submission (phone, fax, portal): portal Phone # Fax # Auth type: Buy/Bill HB Units/visits requested:  Reference number:  Approval from: 03/17/24 to 09/28/24

## 2024-03-21 ENCOUNTER — Other Ambulatory Visit (HOSPITAL_COMMUNITY): Payer: Self-pay | Admitting: Cardiology

## 2024-03-22 ENCOUNTER — Ambulatory Visit

## 2024-03-28 ENCOUNTER — Ambulatory Visit

## 2024-03-28 VITALS — BP 116/70 | HR 50 | Temp 97.6°F | Resp 16 | Ht 64.0 in | Wt 132.6 lb

## 2024-03-28 DIAGNOSIS — E785 Hyperlipidemia, unspecified: Secondary | ICD-10-CM

## 2024-03-28 DIAGNOSIS — I252 Old myocardial infarction: Secondary | ICD-10-CM | POA: Diagnosis not present

## 2024-03-28 MED ORDER — INCLISIRAN SODIUM 284 MG/1.5ML ~~LOC~~ SOSY
284.0000 mg | PREFILLED_SYRINGE | Freq: Once | SUBCUTANEOUS | Status: AC
  Administered 2024-03-28: 284 mg via SUBCUTANEOUS
  Filled 2024-03-28: qty 1.5

## 2024-03-28 NOTE — Progress Notes (Signed)
 Diagnosis: Hyperlipidemia  Provider:  Chilton Greathouse MD  Procedure: Injection  Leqvio (inclisiran), Dose: 284 mg, Site: subcutaneous, Number of injections: 1  Injection Site(s): Left arm  Post Care: Observation period completed  Discharge: Condition: Good, Destination: Home . AVS Provided  Performed by:  Nat Math, RN

## 2024-03-28 NOTE — Addendum Note (Signed)
 Addended by: Iara Monds K on: 03/28/2024 03:49 PM   Modules accepted: Orders

## 2024-03-30 DIAGNOSIS — M792 Neuralgia and neuritis, unspecified: Secondary | ICD-10-CM | POA: Diagnosis not present

## 2024-03-30 DIAGNOSIS — M19071 Primary osteoarthritis, right ankle and foot: Secondary | ICD-10-CM | POA: Diagnosis not present

## 2024-03-30 DIAGNOSIS — M67471 Ganglion, right ankle and foot: Secondary | ICD-10-CM | POA: Diagnosis not present

## 2024-03-30 DIAGNOSIS — Z749 Problem related to care provider dependency, unspecified: Secondary | ICD-10-CM | POA: Diagnosis not present

## 2024-03-30 DIAGNOSIS — M109 Gout, unspecified: Secondary | ICD-10-CM | POA: Diagnosis not present

## 2024-03-30 DIAGNOSIS — M10272 Drug-induced gout, left ankle and foot: Secondary | ICD-10-CM | POA: Diagnosis not present

## 2024-03-30 DIAGNOSIS — M2042 Other hammer toe(s) (acquired), left foot: Secondary | ICD-10-CM | POA: Diagnosis not present

## 2024-03-30 DIAGNOSIS — M65872 Other synovitis and tenosynovitis, left ankle and foot: Secondary | ICD-10-CM | POA: Diagnosis not present

## 2024-03-30 DIAGNOSIS — M19072 Primary osteoarthritis, left ankle and foot: Secondary | ICD-10-CM | POA: Diagnosis not present

## 2024-03-30 DIAGNOSIS — M2041 Other hammer toe(s) (acquired), right foot: Secondary | ICD-10-CM | POA: Diagnosis not present

## 2024-04-08 DIAGNOSIS — M2042 Other hammer toe(s) (acquired), left foot: Secondary | ICD-10-CM | POA: Diagnosis not present

## 2024-04-08 DIAGNOSIS — M65872 Other synovitis and tenosynovitis, left ankle and foot: Secondary | ICD-10-CM | POA: Diagnosis not present

## 2024-04-08 DIAGNOSIS — M10272 Drug-induced gout, left ankle and foot: Secondary | ICD-10-CM | POA: Diagnosis not present

## 2024-04-08 DIAGNOSIS — M2041 Other hammer toe(s) (acquired), right foot: Secondary | ICD-10-CM | POA: Diagnosis not present

## 2024-04-08 DIAGNOSIS — M19071 Primary osteoarthritis, right ankle and foot: Secondary | ICD-10-CM | POA: Diagnosis not present

## 2024-04-08 DIAGNOSIS — M19072 Primary osteoarthritis, left ankle and foot: Secondary | ICD-10-CM | POA: Diagnosis not present

## 2024-04-08 DIAGNOSIS — M109 Gout, unspecified: Secondary | ICD-10-CM | POA: Diagnosis not present

## 2024-04-08 DIAGNOSIS — M67471 Ganglion, right ankle and foot: Secondary | ICD-10-CM | POA: Diagnosis not present

## 2024-04-08 DIAGNOSIS — M792 Neuralgia and neuritis, unspecified: Secondary | ICD-10-CM | POA: Diagnosis not present

## 2024-04-20 DIAGNOSIS — Z85828 Personal history of other malignant neoplasm of skin: Secondary | ICD-10-CM | POA: Diagnosis not present

## 2024-04-20 DIAGNOSIS — D2262 Melanocytic nevi of left upper limb, including shoulder: Secondary | ICD-10-CM | POA: Diagnosis not present

## 2024-04-20 DIAGNOSIS — L812 Freckles: Secondary | ICD-10-CM | POA: Diagnosis not present

## 2024-04-20 DIAGNOSIS — D1801 Hemangioma of skin and subcutaneous tissue: Secondary | ICD-10-CM | POA: Diagnosis not present

## 2024-04-20 DIAGNOSIS — H61002 Unspecified perichondritis of left external ear: Secondary | ICD-10-CM | POA: Diagnosis not present

## 2024-04-20 DIAGNOSIS — D2271 Melanocytic nevi of right lower limb, including hip: Secondary | ICD-10-CM | POA: Diagnosis not present

## 2024-04-20 DIAGNOSIS — L821 Other seborrheic keratosis: Secondary | ICD-10-CM | POA: Diagnosis not present

## 2024-04-20 DIAGNOSIS — D2272 Melanocytic nevi of left lower limb, including hip: Secondary | ICD-10-CM | POA: Diagnosis not present

## 2024-05-24 DIAGNOSIS — H9191 Unspecified hearing loss, right ear: Secondary | ICD-10-CM | POA: Diagnosis not present

## 2024-05-24 DIAGNOSIS — H6123 Impacted cerumen, bilateral: Secondary | ICD-10-CM | POA: Diagnosis not present

## 2024-05-24 DIAGNOSIS — Z974 Presence of external hearing-aid: Secondary | ICD-10-CM | POA: Diagnosis not present

## 2024-06-08 ENCOUNTER — Ambulatory Visit (HOSPITAL_COMMUNITY): Payer: Self-pay | Admitting: Cardiology

## 2024-06-08 ENCOUNTER — Ambulatory Visit (HOSPITAL_COMMUNITY)
Admission: RE | Admit: 2024-06-08 | Discharge: 2024-06-08 | Disposition: A | Discharge status: Home / Self Care | Source: Ambulatory Visit | Attending: Cardiology | Admitting: Cardiology

## 2024-06-08 VITALS — BP 110/60 | HR 62

## 2024-06-08 DIAGNOSIS — I5022 Chronic systolic (congestive) heart failure: Secondary | ICD-10-CM | POA: Diagnosis not present

## 2024-06-08 DIAGNOSIS — R9431 Abnormal electrocardiogram [ECG] [EKG]: Secondary | ICD-10-CM | POA: Insufficient documentation

## 2024-06-08 DIAGNOSIS — Z853 Personal history of malignant neoplasm of breast: Secondary | ICD-10-CM | POA: Diagnosis not present

## 2024-06-08 DIAGNOSIS — I110 Hypertensive heart disease with heart failure: Secondary | ICD-10-CM | POA: Diagnosis not present

## 2024-06-08 DIAGNOSIS — Z86718 Personal history of other venous thrombosis and embolism: Secondary | ICD-10-CM | POA: Insufficient documentation

## 2024-06-08 DIAGNOSIS — Z8674 Personal history of sudden cardiac arrest: Secondary | ICD-10-CM | POA: Insufficient documentation

## 2024-06-08 DIAGNOSIS — I447 Left bundle-branch block, unspecified: Secondary | ICD-10-CM | POA: Diagnosis not present

## 2024-06-08 DIAGNOSIS — I480 Paroxysmal atrial fibrillation: Secondary | ICD-10-CM | POA: Diagnosis not present

## 2024-06-08 DIAGNOSIS — I2510 Atherosclerotic heart disease of native coronary artery without angina pectoris: Secondary | ICD-10-CM | POA: Insufficient documentation

## 2024-06-08 DIAGNOSIS — Z7901 Long term (current) use of anticoagulants: Secondary | ICD-10-CM | POA: Diagnosis not present

## 2024-06-08 DIAGNOSIS — I252 Old myocardial infarction: Secondary | ICD-10-CM | POA: Diagnosis not present

## 2024-06-08 DIAGNOSIS — I255 Ischemic cardiomyopathy: Secondary | ICD-10-CM | POA: Diagnosis not present

## 2024-06-08 DIAGNOSIS — Z79899 Other long term (current) drug therapy: Secondary | ICD-10-CM | POA: Diagnosis not present

## 2024-06-08 LAB — BASIC METABOLIC PANEL WITH GFR
Anion gap: 7 (ref 5–15)
BUN: 31 mg/dL — ABNORMAL HIGH (ref 8–23)
CO2: 23 mmol/L (ref 22–32)
Calcium: 10.8 mg/dL — ABNORMAL HIGH (ref 8.9–10.3)
Chloride: 109 mmol/L (ref 98–111)
Creatinine, Ser: 1.15 mg/dL — ABNORMAL HIGH (ref 0.44–1.00)
GFR, Estimated: 44 mL/min — ABNORMAL LOW (ref >60)
Glucose, Bld: 96 mg/dL (ref 70–99)
Potassium: 4.6 mmol/L (ref 3.5–5.1)
Sodium: 139 mmol/L (ref 135–145)

## 2024-06-08 LAB — BRAIN NATRIURETIC PEPTIDE: B Natriuretic Peptide: 322.4 pg/mL — ABNORMAL HIGH (ref 0.0–100.0)

## 2024-06-08 MED ORDER — FUROSEMIDE 20 MG PO TABS: 20.0000 mg | ORAL_TABLET | ORAL | Status: DC

## 2024-06-08 NOTE — Progress Notes (Signed)
 PCP: Shepard Ade, MD Cardiology: Dr. Rolan  Chief complaint: CHF  89 y.o. with history of breast cancer, chronic LBBB, and HTN was admitted to a hospital in [place], [place]  with NSTEMI and VF arrest in 2/24.  She was referred by Dr. Shepard for evaluation of CHF, atrial fibrillation, and acute systolic CHF.  Prior to MI in 2/24, patient was quite healthy.  She played tennis and pickleball. She had no exertional dyspnea or chest pain.  She had a DVT in 2019 after a long car trip.  She had also, of note, received Adriamycin years ago with treatment of breast cancer.   In 2/24, she developed severe heartburn one evening.  Meds for heartburn did not help.  Pain eventually eased off and she was able to sleep.  However, chest pain returned again the next day and she went to the hospital.  While checking into the ER, she had a VF arrest requiring defibrillation and CPR. She broke several ribs. She was taken for cath, this showed severe stenosis in LCx that was treated with DES.  She developed atrial fibrillation while in the hospital and Eliquis  was started.  She also was noted to have CHF and received IV Lasix . She was discharged with a Lifevest. At my initial visit with her, she was short of breath and volume overloaded, Lasix  was increased.  Echo in 3/24 showed EF 30-35%, moderate infarct-related MR, normal RV.   Echo 5/24 showed  EF 35-40% with basal to mid inferolateral and anterolateral akinesis, RV normal, PASP 31 mmHg, trivial MR. LifeVest removed.  Echo 12/2023 EF 40-45%, normal RV, trivial MR.   Today she returns for HF follow up. She has noted some dyspnea during her walks, generally walks for 30-40 minutes.  Mild dyspnea walking up stairs.  No chest pain.  No orthopnea/PND.  She does water aerobics with no problems. Weight up 2 lbs.   ECG (personally reviewed): NSR, LBBB 120 msec  Labs (5/25): LDL 88, K 4.5, creatinine 8.92  PMH: 1. LBBB: Chronic 2. HTN 3. Breast cancer: Treated in  2008 with surgery and radiation.  She had Adriamycin.  4. Hyperlipidemia 5. DVT: 2019, in setting of long car ride.  6. Atrial fibrillation: Paroxysmal.  First noted during admission for MI in 2024.  7. CAD: NSTEMI 2/24, treated in [place], [place].  Cath showed severe stenosis in LCx, she had DES placed.  8. Chronic systolic CHF: Echo in 9/19 with EF 50-55%.  Ischemic cardiomyopathy.  Echo from 2/24 in [place] per family's report showed a weak heart.  - Echo (3/24): EF 30-35%, moderate infarct-related MR, normal RV.  - Echo (5/24): EF 35-40% with basal to mid inferolateral and anterolateral akinesis, RV normal, PASP 31 mmHg, trivial MR.  - Echo (4/25): EF 40-45%, normal RV, trivial MR.  9. VF arrest: In 2/24 in the setting of MI.    SH: Widow, lives in [place].  2 children.  No smoking.  No ETOH.   FH: Son with CABG.   ROS: All systems reviewed and negative except as per HPI.   Current Outpatient Medications  Medication Sig Dispense Refill   calcium -vitamin D  (OSCAL WITH D) 500-5 MG-MCG tablet Take 1 tablet by mouth daily.     carvedilol  (COREG ) 6.25 MG tablet Take 1 tablet (6.25 mg total) by mouth 2 (two) times daily with a meal. 180 tablet 3   dapagliflozin  propanediol (FARXIGA ) 10 MG TABS tablet TAKE 1 TABLET BY MOUTH DAILY BEFORE BREAKFAST. 90 tablet 2  diazepam (VALIUM) 5 MG tablet As needed     ELIQUIS  5 MG TABS tablet TAKE 1 TABLET BY MOUTH TWICE A DAY 60 tablet 11   inclisiran (LEQVIO ) 284 MG/1.5ML SOSY injection Inject 284 mg into the skin once.     Multiple Vitamins-Minerals (EYE VITAMINS PO) Take 1 tablet by mouth 2 (two) times daily.     nitroGLYCERIN  (NITROSTAT ) 0.4 MG SL tablet Place 1 tablet (0.4 mg total) under the tongue every 5 (five) minutes as needed for chest pain. 90 tablet 3   spironolactone  (ALDACTONE ) 25 MG tablet Take 1 tablet (25 mg total) by mouth daily. 90 tablet 3   valsartan  (DIOVAN ) 40 MG tablet Take 20 mg by mouth 2 (two) times daily.     furosemide   (LASIX ) 20 MG tablet Take 1 tablet (20 mg total) by mouth every other day. Mondays and Fridays     No current facility-administered medications for this encounter.   Wt Readings from Last 3 Encounters:  03/28/24 60.1 kg (132 lb 9.6 oz)  03/08/24 59 kg (130 lb)  01/22/24 59.7 kg (131 lb 9.6 oz)   BP 110/60   Pulse 62   SpO2 97%  General: NAD Neck: JVP 8 cm, no thyromegaly or thyroid  nodule.  Lungs: Clear to auscultation bilaterally with normal respiratory effort. CV: Nondisplaced PMI.  Heart regular S1/S2, no S3/S4, no murmur.  No peripheral edema.  No carotid bruit.  Normal pedal pulses.  Abdomen: Soft, nontender, no hepatosplenomegaly, no distention.  Skin: Intact without lesions or rashes.  Neurologic: Alert and oriented x 3.  Psych: Normal affect. Extremities: No clubbing or cyanosis.  HEENT: Normal.   Assessment/Plan: 1. CAD: NSTEMI in 2/24 with DES to LCx.  Complicated by VF arrest, acute systolic CHF, and atrial fibrillation.  No further chest pain.   - Continue eliquis  5 mg twice a day.     - Off lipitor due to myalgias.  - She has started Leqvio .  Check lipids today, goal LDL < 55.   2. Atrial fibrillation: Paroxysmal.  First noted in 2/24 with MI. NSR today.  - Continue Eliquis .  - Continue Coreg .  3. Chronic systolic CHF: Ischemic cardiomyopathy. Echo in [place], [place] at time of NSTEMI apparently showed low EF per family's report.  Echo in 3/24 with EF 30-35%, moderate infarct-related MR, normal RV.  Echo in 5/24 showed EF 35-40%, trivial MR, normal RV.  Echo in 4/25 showed EF 40-45%, normal RV, trivial MR.  NYHA class II (mildly more symptomatic), probably mild volume overload.   - Increase Lasix  to 20 mg every other day.  BMET/BNP today and BMET in 10 days.   - Continue  valsartan  20 mg twice a day. Lightheaded with lowest dose Entresto .   - Continue Farxiga  10 mg daily.  - Continue spironolactone  25 mg daily.  - Continue Coreg  6.25 mg bid.  - LBBB is not wide enough  that she would be likely to show benefit with CRT.   4. VF arrest: In setting of MI in [place]  in 2/24. Given revascularization, ICD was not placed during her admission.  - EF > 35%, no indication for ICD (and also advanced age).   Follow up in 6 wks with APP.    I spent 31 minutes reviewing data and LVAD parameters, interviewing patient, and organizing the orders/followup.   Ezra Shuck  06/08/2024

## 2024-06-08 NOTE — Patient Instructions (Signed)
 Medication Changes:  CHANGE Furosemide  to 20 mg every other day   Lab Work:  Labs done today, your results will be available in MyChart, we will contact you for abnormal readings.  Your physician recommends that you return for lab work in: 1-2 weeks   Special Instructions // Education:  Do the following things EVERYDAY: Weigh yourself in the morning before breakfast. Write it down and keep it in a log. Take your medicines as prescribed Eat low salt foods--Limit salt (sodium) to 2000 mg per day.  Stay as active as you can everyday Limit all fluids for the day to less than 2 liters   Follow-Up in: 6 weeks    At the [facility], you and your health needs are our priority. We have a designated team specialized in the treatment of Heart Failure. This Care Team includes your primary Heart Failure Specialized Cardiologist (physician), Advanced Practice Providers (APPs- Physician Assistants and Nurse Practitioners), and Pharmacist who all work together to provide you with the care you need, when you need it.   You may see any of the following providers on your designated Care Team at your next follow up:  Dr. Toribio Fuel Dr. Ezra Shuck Dr. Ria Commander Dr. Odis Brownie Greig Mosses, NP Caffie Shed, [place] [facility] [place], [place] Beckey Coe, NP [place] Lee, NP Tinnie Redman, PharmD   Please be sure to bring in all your medications bottles to every appointment.   Need to Contact Us :  If you have any questions or concerns before your next appointment please send us  a message through [place] or call our office at [phone].    TO LEAVE A MESSAGE FOR THE NURSE SELECT OPTION 2, PLEASE LEAVE A MESSAGE INCLUDING: YOUR NAME DATE OF BIRTH CALL BACK NUMBER REASON FOR CALL**this is important as we prioritize the call backs  YOU WILL RECEIVE A CALL BACK THE SAME DAY AS LONG AS YOU CALL BEFORE 4:00 PM

## 2024-06-09 ENCOUNTER — Encounter (HOSPITAL_COMMUNITY): Admitting: Cardiology

## 2024-06-20 ENCOUNTER — Ambulatory Visit (HOSPITAL_COMMUNITY)
Admission: RE | Admit: 2024-06-20 | Discharge: 2024-06-20 | Disposition: A | Discharge status: Home / Self Care | Source: Ambulatory Visit | Attending: Cardiology

## 2024-06-20 DIAGNOSIS — I5022 Chronic systolic (congestive) heart failure: Secondary | ICD-10-CM | POA: Insufficient documentation

## 2024-06-20 LAB — BASIC METABOLIC PANEL WITH GFR
Anion gap: 6 (ref 5–15)
BUN: 35 mg/dL — ABNORMAL HIGH (ref 8–23)
CO2: 24 mmol/L (ref 22–32)
Calcium: 10.6 mg/dL — ABNORMAL HIGH (ref 8.9–10.3)
Chloride: 108 mmol/L (ref 98–111)
Creatinine, Ser: 1.31 mg/dL — ABNORMAL HIGH (ref 0.44–1.00)
GFR, Estimated: 38 mL/min — ABNORMAL LOW (ref >60)
Glucose, Bld: 135 mg/dL — ABNORMAL HIGH (ref 70–99)
Potassium: 4.3 mmol/L (ref 3.5–5.1)
Sodium: 138 mmol/L (ref 135–145)

## 2024-06-22 MED ORDER — FUROSEMIDE 20 MG PO TABS: 20.0000 mg | ORAL_TABLET | ORAL | 3 refills | Status: DC

## 2024-06-22 NOTE — Telephone Encounter (Signed)
 Pt aware and voiced understanding

## 2024-06-28 ENCOUNTER — Ambulatory Visit (INDEPENDENT_AMBULATORY_CARE_PROVIDER_SITE_OTHER)

## 2024-06-28 VITALS — BP 115/69 | HR 60 | Temp 97.4°F | Resp 20 | Ht 64.0 in | Wt 131.6 lb

## 2024-06-28 DIAGNOSIS — I252 Old myocardial infarction: Secondary | ICD-10-CM

## 2024-06-28 DIAGNOSIS — E785 Hyperlipidemia, unspecified: Secondary | ICD-10-CM

## 2024-06-28 MED ORDER — INCLISIRAN SODIUM 284 MG/1.5ML ~~LOC~~ SOSY
284.0000 mg | PREFILLED_SYRINGE | Freq: Once | SUBCUTANEOUS | Status: AC
  Administered 2024-06-28: 284 mg via SUBCUTANEOUS
  Filled 2024-06-28: qty 1.5

## 2024-06-28 NOTE — Progress Notes (Signed)
 Diagnosis: Hyperlipidemia  Provider:  Mannam, Praveen MD  Procedure: Injection  Leqvio  (inclisiran), Dose: 284 mg, Site: subcutaneous, Number of injections: 1  Injection Site(s): Left lower quad. abdomen  Post Care: Observation period completed Patient requested 10 minute observation period  Discharge: Condition: Good, Destination: Home . AVS Provided  Performed by:  Eleanor DELENA Bloch, RN

## 2024-07-04 DIAGNOSIS — H52203 Unspecified astigmatism, bilateral: Secondary | ICD-10-CM | POA: Diagnosis not present

## 2024-07-04 DIAGNOSIS — H04123 Dry eye syndrome of bilateral lacrimal glands: Secondary | ICD-10-CM | POA: Diagnosis not present

## 2024-07-04 DIAGNOSIS — H40012 Open angle with borderline findings, low risk, left eye: Secondary | ICD-10-CM | POA: Diagnosis not present

## 2024-07-04 DIAGNOSIS — Z961 Presence of intraocular lens: Secondary | ICD-10-CM | POA: Diagnosis not present

## 2024-07-18 DIAGNOSIS — Z1389 Encounter for screening for other disorder: Secondary | ICD-10-CM | POA: Diagnosis not present

## 2024-07-18 DIAGNOSIS — E785 Hyperlipidemia, unspecified: Secondary | ICD-10-CM | POA: Diagnosis not present

## 2024-07-18 DIAGNOSIS — I10 Essential (primary) hypertension: Secondary | ICD-10-CM | POA: Diagnosis not present

## 2024-07-20 NOTE — Progress Notes (Signed)
 PCP: Shepard Ade, MD Cardiology: Dr. Rolan  Chief complaint: CHF  89 y.o. with history of breast cancer, chronic LBBB, and HTN was admitted to a hospital in [place], [place]  with NSTEMI and VF arrest in 2/24.  She was referred by Dr. Shepard for evaluation of CHF, atrial fibrillation, and acute systolic CHF.  Prior to MI in 2/24, patient was quite healthy.  She played tennis and pickleball. She had no exertional dyspnea or chest pain.  She had a DVT in 2019 after a long car trip.  She had also, of note, received Adriamycin years ago with treatment of breast cancer.   In 2/24, she developed severe heartburn one evening.  Meds for heartburn did not help.  Pain eventually eased off and she was able to sleep.  However, chest pain returned again the next day and she went to the hospital.  While checking into the ER, she had a VF arrest requiring defibrillation and CPR. She broke several ribs. She was taken for cath, this showed severe stenosis in LCx that was treated with DES.  She developed atrial fibrillation while in the hospital and Eliquis  was started.  She also was noted to have CHF and received IV Lasix . She was discharged with a Lifevest. At my initial visit with her, she was short of breath and volume overloaded, Lasix  was increased.  Echo in 3/24 showed EF 30-35%, moderate infarct-related MR, normal RV.   Echo 5/24 showed  EF 35-40% with basal to mid inferolateral and anterolateral akinesis, RV normal, PASP 31 mmHg, trivial MR. LifeVest removed.  Echo 12/2023 EF 40-45%, normal RV, trivial MR.   Today she returns for HF follow up. She has noted some dyspnea during her walks, generally walks for 30-40 minutes.  Mild dyspnea walking up stairs.  No chest pain.  No orthopnea/PND.  She does water aerobics with no problems. Weight up 2 lbs.   ECG (personally reviewed): NSR, LBBB 120 msec  Labs (5/25): LDL 88, K 4.5, creatinine 8.92  PMH: 1. LBBB: Chronic 2. HTN 3. Breast cancer: Treated in  2008 with surgery and radiation.  She had Adriamycin.  4. Hyperlipidemia 5. DVT: 2019, in setting of long car ride.  6. Atrial fibrillation: Paroxysmal.  First noted during admission for MI in 2024.  7. CAD: NSTEMI 2/24, treated in [place], [place].  Cath showed severe stenosis in LCx, she had DES placed.  8. Chronic systolic CHF: Echo in 9/19 with EF 50-55%.  Ischemic cardiomyopathy.  Echo from 2/24 in [place] per family's report showed a weak heart.  - Echo (3/24): EF 30-35%, moderate infarct-related MR, normal RV.  - Echo (5/24): EF 35-40% with basal to mid inferolateral and anterolateral akinesis, RV normal, PASP 31 mmHg, trivial MR.  - Echo (4/25): EF 40-45%, normal RV, trivial MR.  9. VF arrest: In 2/24 in the setting of MI.    SH: Widow, lives in [place].  2 children.  No smoking.  No ETOH.   FH: Son with CABG.   ROS: All systems reviewed and negative except as per HPI.   Current Outpatient Medications  Medication Sig Dispense Refill   calcium -vitamin D  (OSCAL WITH D) 500-5 MG-MCG tablet Take 1 tablet by mouth daily.     carvedilol  (COREG ) 6.25 MG tablet Take 1 tablet (6.25 mg total) by mouth 2 (two) times daily with a meal. 180 tablet 3   dapagliflozin  propanediol (FARXIGA ) 10 MG TABS tablet TAKE 1 TABLET BY MOUTH DAILY BEFORE BREAKFAST. 90 tablet 2  diazepam (VALIUM) 5 MG tablet As needed     ELIQUIS  5 MG TABS tablet TAKE 1 TABLET BY MOUTH TWICE A DAY 60 tablet 11   furosemide  (LASIX ) 20 MG tablet Take 1 tablet (20 mg total) by mouth every 3 (three) days. 30 tablet 3   inclisiran (LEQVIO ) 284 MG/1.5ML SOSY injection Inject 284 mg into the skin once.     Multiple Vitamins-Minerals (EYE VITAMINS PO) Take 1 tablet by mouth 2 (two) times daily.     nitroGLYCERIN  (NITROSTAT ) 0.4 MG SL tablet Place 1 tablet (0.4 mg total) under the tongue every 5 (five) minutes as needed for chest pain. 90 tablet 3   spironolactone  (ALDACTONE ) 25 MG tablet Take 1 tablet (25 mg total) by mouth daily. 90  tablet 3   valsartan  (DIOVAN ) 40 MG tablet Take 20 mg by mouth 2 (two) times daily.     No current facility-administered medications for this visit.   Wt Readings from Last 3 Encounters:  06/28/24 59.7 kg (131 lb 9.6 oz)  03/28/24 60.1 kg (132 lb 9.6 oz)  03/08/24 59 kg (130 lb)   There were no vitals taken for this visit. General: NAD Neck: JVP 8 cm, no thyromegaly or thyroid  nodule.  Lungs: Clear to auscultation bilaterally with normal respiratory effort. CV: Nondisplaced PMI.  Heart regular S1/S2, no S3/S4, no murmur.  No peripheral edema.  No carotid bruit.  Normal pedal pulses.  Abdomen: Soft, nontender, no hepatosplenomegaly, no distention.  Skin: Intact without lesions or rashes.  Neurologic: Alert and oriented x 3.  Psych: Normal affect. Extremities: No clubbing or cyanosis.  HEENT: Normal.   Assessment/Plan: 1. CAD: NSTEMI in 2/24 with DES to LCx.  Complicated by VF arrest, acute systolic CHF, and atrial fibrillation.  No further chest pain.   - Continue eliquis  5 mg twice a day.     - Off lipitor due to myalgias.  - She has started Leqvio .  Check lipids today, goal LDL < 55.   2. Atrial fibrillation: Paroxysmal.  First noted in 2/24 with MI. NSR today.  - Continue Eliquis .  - Continue Coreg .  3. Chronic systolic CHF: Ischemic cardiomyopathy. Echo in [place], [place] at time of NSTEMI apparently showed low EF per family's report.  Echo in 3/24 with EF 30-35%, moderate infarct-related MR, normal RV.  Echo in 5/24 showed EF 35-40%, trivial MR, normal RV.  Echo in 4/25 showed EF 40-45%, normal RV, trivial MR.  NYHA class II (mildly more symptomatic), probably mild volume overload.   - Increase Lasix  to 20 mg every other day.  BMET/BNP today and BMET in 10 days.   - Continue  valsartan  20 mg twice a day. Lightheaded with lowest dose Entresto .   - Continue Farxiga  10 mg daily.  - Continue spironolactone  25 mg daily.  - Continue Coreg  6.25 mg bid.  - LBBB is not wide enough that she  would be likely to show benefit with CRT.   4. VF arrest: In setting of MI in [place]  in 2/24. Given revascularization, ICD was not placed during her admission.  - EF > 35%, no indication for ICD (and also advanced age).   Follow up in 6 wks with APP.    I spent 31 minutes reviewing data and LVAD parameters, interviewing patient, and organizing the orders/followup.   Harlene HERO [facility]  07/20/2024

## 2024-07-21 ENCOUNTER — Telehealth (HOSPITAL_COMMUNITY): Payer: Self-pay

## 2024-07-21 NOTE — Telephone Encounter (Signed)
 Called to confirm/remind patient of their appointment at the [facility] on 07/22/24.   Appointment:   [] Confirmed  [x] Left mess   [] No answer/No voice mail  [] VM Full/unable to leave message  [] Phone not in service  And to bring in all medications and/or complete list.

## 2024-07-22 ENCOUNTER — Ambulatory Visit (HOSPITAL_COMMUNITY)
Admission: RE | Admit: 2024-07-22 | Discharge: 2024-07-22 | Disposition: A | Discharge status: Home / Self Care | Source: Ambulatory Visit | Attending: Family Medicine | Admitting: Family Medicine

## 2024-07-22 ENCOUNTER — Encounter (HOSPITAL_COMMUNITY): Payer: Self-pay

## 2024-07-22 ENCOUNTER — Ambulatory Visit (HOSPITAL_COMMUNITY): Payer: Self-pay | Admitting: Family Medicine

## 2024-07-22 VITALS — BP 118/70 | HR 60 | Ht 64.0 in | Wt 132.0 lb

## 2024-07-22 DIAGNOSIS — I447 Left bundle-branch block, unspecified: Secondary | ICD-10-CM | POA: Diagnosis not present

## 2024-07-22 DIAGNOSIS — I252 Old myocardial infarction: Secondary | ICD-10-CM | POA: Diagnosis not present

## 2024-07-22 DIAGNOSIS — Z7984 Long term (current) use of oral hypoglycemic drugs: Secondary | ICD-10-CM | POA: Insufficient documentation

## 2024-07-22 DIAGNOSIS — Z8674 Personal history of sudden cardiac arrest: Secondary | ICD-10-CM | POA: Diagnosis not present

## 2024-07-22 DIAGNOSIS — I480 Paroxysmal atrial fibrillation: Secondary | ICD-10-CM | POA: Diagnosis not present

## 2024-07-22 DIAGNOSIS — Z79899 Other long term (current) drug therapy: Secondary | ICD-10-CM | POA: Diagnosis not present

## 2024-07-22 DIAGNOSIS — Z7901 Long term (current) use of anticoagulants: Secondary | ICD-10-CM | POA: Diagnosis not present

## 2024-07-22 DIAGNOSIS — Z853 Personal history of malignant neoplasm of breast: Secondary | ICD-10-CM | POA: Diagnosis not present

## 2024-07-22 DIAGNOSIS — I2510 Atherosclerotic heart disease of native coronary artery without angina pectoris: Secondary | ICD-10-CM | POA: Diagnosis not present

## 2024-07-22 DIAGNOSIS — I110 Hypertensive heart disease with heart failure: Secondary | ICD-10-CM | POA: Insufficient documentation

## 2024-07-22 DIAGNOSIS — I4901 Ventricular fibrillation: Secondary | ICD-10-CM

## 2024-07-22 DIAGNOSIS — I255 Ischemic cardiomyopathy: Secondary | ICD-10-CM | POA: Diagnosis not present

## 2024-07-22 DIAGNOSIS — I5022 Chronic systolic (congestive) heart failure: Secondary | ICD-10-CM | POA: Insufficient documentation

## 2024-07-22 LAB — CBC
HCT: 40.3 % (ref 36.0–46.0)
Hemoglobin: 13.3 g/dL (ref 12.0–15.0)
MCH: 29.8 pg (ref 26.0–34.0)
MCHC: 33 g/dL (ref 30.0–36.0)
MCV: 90.4 fL (ref 80.0–100.0)
Platelets: 165 K/uL (ref 150–400)
RBC: 4.46 MIL/uL (ref 3.87–5.11)
RDW: 14.2 % (ref 11.5–15.5)
WBC: 5.4 K/uL (ref 4.0–10.5)
nRBC: 0 % (ref 0.0–0.2)

## 2024-07-22 LAB — BASIC METABOLIC PANEL WITH GFR
Anion gap: 7 (ref 5–15)
BUN: 34 mg/dL — ABNORMAL HIGH (ref 8–23)
CO2: 23 mmol/L (ref 22–32)
Calcium: 10.7 mg/dL — ABNORMAL HIGH (ref 8.9–10.3)
Chloride: 106 mmol/L (ref 98–111)
Creatinine, Ser: 1.2 mg/dL — ABNORMAL HIGH (ref 0.44–1.00)
GFR, Estimated: 42 mL/min — ABNORMAL LOW (ref >60)
Glucose, Bld: 100 mg/dL — ABNORMAL HIGH (ref 70–99)
Potassium: 4.9 mmol/L (ref 3.5–5.1)
Sodium: 136 mmol/L (ref 135–145)

## 2024-07-22 MED ORDER — FUROSEMIDE 20 MG PO TABS: 20.0000 mg | ORAL_TABLET | ORAL | 3 refills | Status: AC

## 2024-07-22 NOTE — Patient Instructions (Addendum)
 Good to see you today!  DECREASE Lasix  to 20 mg  once a week ( Thursdays)  Labs done today, your results will be available in MyChart, we will contact you for abnormal readings.  Your physician recommends that you schedule a follow-up appointment 6 months (April ) Call office in February to schedule an appointment  If you have any questions or concerns before your next appointment please send us  a message through [facility] or call our office at [phone].    TO LEAVE A MESSAGE FOR THE NURSE SELECT OPTION 2, PLEASE LEAVE A MESSAGE INCLUDING: YOUR NAME DATE OF BIRTH CALL BACK NUMBER REASON FOR CALL**this is important as we prioritize the call backs  YOU WILL RECEIVE A CALL BACK THE SAME DAY AS LONG AS YOU CALL BEFORE 4:00 PM At the [facility], you and your health needs are our priority. As part of our continuing mission to provide you with exceptional heart care, we have created designated Provider Care Teams. These Care Teams include your primary Cardiologist (physician) and Advanced Practice Providers (APPs- Physician Assistants and Nurse Practitioners) who all work together to provide you with the care you need, when you need it.   You may see any of the following providers on your designated Care Team at your next follow up: Dr Toribio Fuel Dr Ezra Shuck Dr. Ria Commander Dr. Morene Brownie Amy Lenetta, NP Caffie Shed, [place] [facility] [place], [place] Beckey Coe, NP [place] Lee, NP Ellouise Class, NP Tinnie Redman, PharmD Jaun Bash, PharmD   Please be sure to bring in all your medications bottles to every appointment.    Thank you for choosing [facility]

## 2024-07-22 NOTE — Progress Notes (Signed)
 ReDS Vest / Clip - 07/22/24 0915       ReDS Vest / Clip   Station Marker A    Ruler Value 24    ReDS Value Range Low volume    ReDS Actual Value 25

## 2024-07-25 DIAGNOSIS — Z0000 Encounter for general adult medical examination without abnormal findings: Secondary | ICD-10-CM | POA: Diagnosis not present

## 2024-07-25 DIAGNOSIS — I4901 Ventricular fibrillation: Secondary | ICD-10-CM | POA: Diagnosis not present

## 2024-07-25 DIAGNOSIS — E785 Hyperlipidemia, unspecified: Secondary | ICD-10-CM | POA: Diagnosis not present

## 2024-07-25 DIAGNOSIS — I10 Essential (primary) hypertension: Secondary | ICD-10-CM | POA: Diagnosis not present

## 2024-07-25 DIAGNOSIS — R82998 Other abnormal findings in urine: Secondary | ICD-10-CM | POA: Diagnosis not present

## 2024-07-25 DIAGNOSIS — Z23 Encounter for immunization: Secondary | ICD-10-CM | POA: Diagnosis not present

## 2024-07-25 DIAGNOSIS — Z1339 Encounter for screening examination for other mental health and behavioral disorders: Secondary | ICD-10-CM | POA: Diagnosis not present

## 2024-07-25 DIAGNOSIS — I252 Old myocardial infarction: Secondary | ICD-10-CM | POA: Diagnosis not present

## 2024-07-25 DIAGNOSIS — I480 Paroxysmal atrial fibrillation: Secondary | ICD-10-CM | POA: Diagnosis not present

## 2024-07-25 DIAGNOSIS — D6869 Other thrombophilia: Secondary | ICD-10-CM | POA: Diagnosis not present

## 2024-07-25 DIAGNOSIS — Z853 Personal history of malignant neoplasm of breast: Secondary | ICD-10-CM | POA: Diagnosis not present

## 2024-07-25 DIAGNOSIS — I25118 Atherosclerotic heart disease of native coronary artery with other forms of angina pectoris: Secondary | ICD-10-CM | POA: Diagnosis not present

## 2024-07-25 DIAGNOSIS — Z1331 Encounter for screening for depression: Secondary | ICD-10-CM | POA: Diagnosis not present

## 2024-08-05 ENCOUNTER — Other Ambulatory Visit (HOSPITAL_COMMUNITY): Payer: Self-pay | Admitting: Cardiology

## 2024-08-29 ENCOUNTER — Other Ambulatory Visit: Payer: Self-pay | Admitting: Pharmacist

## 2024-08-29 DIAGNOSIS — E785 Hyperlipidemia, unspecified: Secondary | ICD-10-CM

## 2024-09-02 ENCOUNTER — Telehealth: Payer: Self-pay | Admitting: Pharmacist

## 2024-09-02 NOTE — Telephone Encounter (Signed)
 Patient had cholesterol labs done at PCP. LDL-C is 33. Reviewed with patient. Advised to continue Leqvio  q 6 months. Let us  know if insurance changes.  Labs in media

## 2024-09-09 ENCOUNTER — Other Ambulatory Visit (HOSPITAL_COMMUNITY): Payer: Self-pay | Admitting: Cardiology

## 2024-10-05 ENCOUNTER — Other Ambulatory Visit (HOSPITAL_COMMUNITY): Payer: Self-pay | Admitting: Cardiology

## 2024-10-05 DIAGNOSIS — Z79899 Other long term (current) drug therapy: Secondary | ICD-10-CM

## 2024-10-05 DIAGNOSIS — I5022 Chronic systolic (congestive) heart failure: Secondary | ICD-10-CM

## 2024-12-28 ENCOUNTER — Ambulatory Visit

## 2025-01-04 ENCOUNTER — Ambulatory Visit (HOSPITAL_COMMUNITY): Admitting: Cardiology
